# Patient Record
Sex: Female | Born: 1950
Health system: Southern US, Community
[De-identification: ages and names within clinical notes are randomized; demographics above are authoritative.]

## PROBLEM LIST (undated history)

## (undated) DIAGNOSIS — J302 Other seasonal allergic rhinitis: Secondary | ICD-10-CM

## (undated) DIAGNOSIS — M1990 Unspecified osteoarthritis, unspecified site: Secondary | ICD-10-CM

## (undated) DIAGNOSIS — F419 Anxiety disorder, unspecified: Secondary | ICD-10-CM

## (undated) DIAGNOSIS — Z8632 Personal history of gestational diabetes: Secondary | ICD-10-CM

## (undated) DIAGNOSIS — C679 Malignant neoplasm of bladder, unspecified: Secondary | ICD-10-CM

## (undated) DIAGNOSIS — Z8719 Personal history of other diseases of the digestive system: Secondary | ICD-10-CM

## (undated) DIAGNOSIS — Z9289 Personal history of other medical treatment: Secondary | ICD-10-CM

## (undated) DIAGNOSIS — M8580 Other specified disorders of bone density and structure, unspecified site: Secondary | ICD-10-CM

## (undated) DIAGNOSIS — E785 Hyperlipidemia, unspecified: Secondary | ICD-10-CM

## (undated) DIAGNOSIS — R002 Palpitations: Secondary | ICD-10-CM

## (undated) DIAGNOSIS — Z973 Presence of spectacles and contact lenses: Secondary | ICD-10-CM

## (undated) DIAGNOSIS — I493 Ventricular premature depolarization: Secondary | ICD-10-CM

## (undated) DIAGNOSIS — J45909 Unspecified asthma, uncomplicated: Secondary | ICD-10-CM

## (undated) DIAGNOSIS — Z8679 Personal history of other diseases of the circulatory system: Secondary | ICD-10-CM

## (undated) DIAGNOSIS — L4050 Arthropathic psoriasis, unspecified: Secondary | ICD-10-CM

## (undated) HISTORY — PX: BREAST BIOPSY: SHX20

## (undated) HISTORY — DX: Unspecified asthma, uncomplicated: J45.909

---

## 1985-11-26 HISTORY — PX: TEMPOROMANDIBULAR JOINT SURGERY: SHX35

## 1999-10-30 ENCOUNTER — Other Ambulatory Visit: Admission: RE | Admit: 1999-10-30 | Discharge: 1999-10-30 | Payer: Self-pay | Admitting: *Deleted

## 2001-02-07 ENCOUNTER — Encounter: Admission: RE | Admit: 2001-02-07 | Discharge: 2001-02-07 | Payer: Self-pay | Admitting: Internal Medicine

## 2001-02-07 ENCOUNTER — Encounter: Payer: Self-pay | Admitting: Internal Medicine

## 2001-02-25 ENCOUNTER — Encounter: Payer: Self-pay | Admitting: Internal Medicine

## 2001-02-25 ENCOUNTER — Encounter: Admission: RE | Admit: 2001-02-25 | Discharge: 2001-02-25 | Payer: Self-pay | Admitting: Family Medicine

## 2001-08-15 ENCOUNTER — Other Ambulatory Visit: Admission: RE | Admit: 2001-08-15 | Discharge: 2001-08-15 | Payer: Self-pay | Admitting: Obstetrics and Gynecology

## 2002-02-24 ENCOUNTER — Ambulatory Visit (HOSPITAL_COMMUNITY): Admission: RE | Admit: 2002-02-24 | Discharge: 2002-02-24 | Payer: Self-pay | Admitting: Gastroenterology

## 2003-02-12 ENCOUNTER — Other Ambulatory Visit: Admission: RE | Admit: 2003-02-12 | Discharge: 2003-02-12 | Payer: Self-pay | Admitting: Obstetrics and Gynecology

## 2003-06-17 ENCOUNTER — Inpatient Hospital Stay (HOSPITAL_COMMUNITY): Admission: RE | Admit: 2003-06-17 | Discharge: 2003-06-20 | Payer: Self-pay | Admitting: Obstetrics and Gynecology

## 2003-06-17 ENCOUNTER — Encounter (INDEPENDENT_AMBULATORY_CARE_PROVIDER_SITE_OTHER): Payer: Self-pay | Admitting: Specialist

## 2003-06-17 HISTORY — PX: TOTAL ABDOMINAL HYSTERECTOMY: SHX209

## 2003-09-03 ENCOUNTER — Encounter: Payer: Self-pay | Admitting: Obstetrics and Gynecology

## 2003-09-03 ENCOUNTER — Ambulatory Visit (HOSPITAL_COMMUNITY): Admission: RE | Admit: 2003-09-03 | Discharge: 2003-09-03 | Payer: Self-pay | Admitting: Obstetrics and Gynecology

## 2004-01-24 ENCOUNTER — Encounter: Admission: RE | Admit: 2004-01-24 | Discharge: 2004-01-24 | Payer: Self-pay | Admitting: *Deleted

## 2006-07-12 ENCOUNTER — Encounter: Admission: RE | Admit: 2006-07-12 | Discharge: 2006-07-12 | Payer: Self-pay | Admitting: Internal Medicine

## 2008-07-16 ENCOUNTER — Encounter: Admission: RE | Admit: 2008-07-16 | Discharge: 2008-07-16 | Payer: Self-pay | Admitting: Internal Medicine

## 2010-11-09 ENCOUNTER — Ambulatory Visit (HOSPITAL_COMMUNITY)
Admission: RE | Admit: 2010-11-09 | Discharge: 2010-11-09 | Payer: Self-pay | Source: Home / Self Care | Attending: Gastroenterology | Admitting: Gastroenterology

## 2010-11-09 HISTORY — PX: COLONOSCOPY: SHX174

## 2011-04-13 NOTE — H&P (Signed)
   NAME:  Christina Campos, Christina Campos                         ACCOUNT NO.:  [id number]   MEDICAL RECORD NO.:  [id number]                   PATIENT TYPE:  AMB   LOCATION:  SDC                                  FACILITY:  [facility]   PHYSICIAN:  Lenoard Aden, M.D.             DATE OF BIRTH:  Sep 17, 1951   DATE OF ADMISSION:  06/17/2003  DATE OF DISCHARGE:                                HISTORY & PHYSICAL   CHIEF COMPLAINT:  Symptomatic fibroids with secondary anemia, for definitive  therapy.   HISTORY OF PRESENT ILLNESS:  The patient is a 60 year old white female, G3,  P2 with symptomatic uterine fibroids and intermittent anemia. Ultrasound  consistent with a markedly enlarged uterus with a least 6 notable fibroids  and bilateral normal ovaries.   MEDICATIONS:  Include Zoloft.   PAST SURGICAL HISTORY:  She has a history of TMJ surgery back in the 1980's.   OBSTETRIC HISTORY:  Remarkable for 3 pregnancies, 1 miscarriage, and 2  vaginal deliveries.   PAST MEDICAL HISTORY:  She has had no other medical or surgical  hospitalizations.   ALLERGIES:  CODEINE.   FAMILY HISTORY:  Diabetes, hypertension and mental retardation.   SOCIAL HISTORY:  She is a previous smoker. She denies domestic or physical  violence.   PHYSICAL EXAMINATION:  GENERAL: A well developed, well nourished  white  female.  VITAL SIGNS: Weight of 201 pounds.  HEENT: Normal.  LUNGS: Clear.  HEART: Regular rate and rhythm.  ABDOMEN: Soft, nontender.  PELVIC: Uterine is 14-16 weeks size. No adnexal masses.  EXTREMITIES: Normal.  NEUROLOGIC: Nonfocal.   IMPRESSION:  Symptomatic uterine fibroids with history of secondary anemia,  now with improvement in her anemia on iron therapy.    PLAN:  Proceed with TAH and possible BSO. Risks of anesthesia, infection,  bleeding, intra-abdominal organ injury. Discussed labor, significant  complications to include bowel and bladder injury ase noted. Ovarian  conservation, risks, and  removal discussed. The patient acknowledges risks  and wishes to proceed.                                               Lenoard Aden, M.D.    RJT/MEDQ  D:  06/16/2003  T:  06/16/2003  Job:  [id number]

## 2011-04-13 NOTE — Discharge Summary (Signed)
   Christina Campos, Christina Campos                         ACCOUNT NO.:  [id number]   MEDICAL RECORD NO.:  [id number]                   PATIENT TYPE:  INP   LOCATION:  9304                                 [facility]ACILITY:  [facility]   PHYSICIAN:  Lenoard Aden, M.D.             DATE OF BIRTH:  12/03/1950   DATE OF ADMISSION:  06/17/2003  DATE OF DISCHARGE:  06/20/2003                                 DISCHARGE SUMMARY   The patient underwent total abdominal hysterectomy, enterocele repair and  right ovarian cystectomy on June 17, 2003 without problem.  Postpartum day  number one the patient was slightly distended, no nausea, vomiting,  tolerated regular diet well.  Normal bowel movement postoperative day number  one.  Discharged to home postoperative day number three after resolution of  postoperative ileus.  As noted, hemoglobin stable, ambulating without  difficulty.  Discharged home on Percocet for pain and incisional care as  discussed.  Follow up in the office in 4-6 weeks.                                               Lenoard Aden, M.D.    RJT/MEDQ  D:  06/20/2003  T:  06/20/2003  Job:  [id number]

## 2011-04-13 NOTE — Discharge Summary (Signed)
   Christina Campos, Christina Campos                         ACCOUNT NO.:  [id number]   MEDICAL RECORD NO.:  [id number]                   PATIENT TYPE:  INP   LOCATION:  9304                                 [facility]ILITY[facility]   PHYSICIAN:  Lenoard Aden, M.D.             DATE OF BIRTH:  [birth year]   DATE OF ADMISSION:  06/17/2003  DATE OF DISCHARGE:  06/20/2003                                 DISCHARGE SUMMARY   No dictation given.                                               Lenoard Aden, M.D.    RJT/MEDQ  D:  06/20/2003  T:  06/20/2003  Job:  [id number]

## 2011-04-13 NOTE — Op Note (Signed)
Christina Campos, Christina Campos                         ACCOUNT NO.:  [id number]   MEDICAL RECORD NO.:  [id number]                   PATIENT TYPE:  INP   LOCATION:  9304                                 [facility]ACILITY:  [facility]   PHYSICIAN:  Lenoard Aden, M.D.             DATE OF BIRTH:  1951-11-04   DATE OF PROCEDURE:  06/17/2003  DATE OF DISCHARGE:                                 OPERATIVE REPORT   PREOPERATIVE DIAGNOSES:  1. Menorrhagia.  2. Uterine fibroids.   POSTOPERATIVE DIAGNOSES:  1. Menorrhagia.  2. Uterine fibroids.  3. Right ovarian cyst.  4. Enterocele.   PROCEDURES:  1. Total abdominal hysterectomy.  2. Right ovarian cystectomy.  3. McCall culdoplasty.   SURGEON:  Lenoard Aden, M.D.   ASSISTANT:  Cordelia Pen A. Rosalio Macadamia, M.D.   ANESTHESIA:  General.   ESTIMATED BLOOD LOSS:  250 mL.   DRAINS:  Foley.   COUNTS:  Correct.   Patient to recovery in good condition.   FINDINGS:  A 16-18 week size uterus.  Bilateral normal tubes and ovaries.   BRIEF OPERATIVE NOTE:  After being apprised of the risks of anesthesia,  infection, bleeding, injury to abdominal organs and need for repair, the  patient was brought to the operating room, where she was administered a  general anesthetic without complications and prepped and draped in the usual  sterile fashion, a Foley catheter had been placed.  After achieving adequate  anesthesia, dilute Marcaine solution was placed in the area.  A Pfannenstiel  skin incision was then made with a scalpel and carried down to the fascia,  which was nicked in the midline and opened transversely using  electrocautery.  Peritoneum identified in the midline, entered sharply.  A  large, irregularly-shaped 16-18 week size uterus was exteriorized.  The  round ligaments are bilaterally grasped and suture ligated.  Retroperitoneal  space is entered.  Bilaterally normal tubes and right ovary with a cystic  nodule on it is removed, being elevated with an  Allis clamp and cauterized  at the base and sent to pathology.  Good hemostasis is noted on that right  ovary.  At this time the tubo-ovarian ligament is grasped and ligated using  the LigaSure.  The same procedure is done on the left side.  The left ovary  appears normal.  At this time uterine vessels are skeletonized bilaterally,  the bladder flap is developed sharply and dissected sharply off the lower  uterine segment.  Uterine vessels are ligated using the LigaSure and  divided.  At this time a partial specimen is removed at the uterine-cervical  junction and sent for permanent section.  The cardinal and broad ligaments  are further ligated using the LigaSure after identifying the ureter  bilaterally.  The uterosacral ligaments are taken separately with Heaney  clamps, clamped, and suture ligated and held.  Vaginal entry is made  laterally.  Specimen of the  uterine cervix is removed.  The uterus is closed  side-to-side using 0 Vicryl interrupted sutures.  At this time after closing  the vagina side-to-side, an enterocele is identified posteriorly and closed  using a McCall culdoplasty suture with 0 Vicryl suture x2.  The uterosacral  ligaments are then plicated to the vaginal angle and then plicated in the  midline.  At this time good hemostasis is noted.  Irrigation is  accomplished.  All packs and Balfour retractor are removed, fascia closed  using 0 Vicryl in continuous running fashion and the skin closed using  staples.  The patient tolerates the procedure well and is transferred to  recovery in good condition.                                               Lenoard Aden, M.D.    RJT/MEDQ  D:  06/17/2003  T:  06/17/2003  Job:  [id number]

## 2012-11-26 DIAGNOSIS — R002 Palpitations: Secondary | ICD-10-CM

## 2012-11-26 DIAGNOSIS — I493 Ventricular premature depolarization: Secondary | ICD-10-CM

## 2012-11-26 HISTORY — DX: Ventricular premature depolarization: I49.3

## 2012-11-26 HISTORY — DX: Palpitations: R00.2

## 2013-08-31 ENCOUNTER — Encounter (HOSPITAL_COMMUNITY): Payer: Self-pay | Admitting: *Deleted

## 2013-08-31 ENCOUNTER — Emergency Department (HOSPITAL_COMMUNITY): Payer: 59

## 2013-08-31 ENCOUNTER — Emergency Department (HOSPITAL_COMMUNITY)
Admission: EM | Admit: 2013-08-31 | Discharge: 2013-08-31 | Disposition: A | Discharge status: Home / Self Care | Payer: 59 | Attending: Emergency Medicine | Admitting: Emergency Medicine

## 2013-08-31 DIAGNOSIS — Z8659 Personal history of other mental and behavioral disorders: Secondary | ICD-10-CM | POA: Insufficient documentation

## 2013-08-31 DIAGNOSIS — Z8639 Personal history of other endocrine, nutritional and metabolic disease: Secondary | ICD-10-CM | POA: Insufficient documentation

## 2013-08-31 DIAGNOSIS — I4949 Other premature depolarization: Secondary | ICD-10-CM | POA: Insufficient documentation

## 2013-08-31 DIAGNOSIS — Z87828 Personal history of other (healed) physical injury and trauma: Secondary | ICD-10-CM | POA: Insufficient documentation

## 2013-08-31 DIAGNOSIS — I10 Essential (primary) hypertension: Secondary | ICD-10-CM | POA: Insufficient documentation

## 2013-08-31 DIAGNOSIS — Z862 Personal history of diseases of the blood and blood-forming organs and certain disorders involving the immune mechanism: Secondary | ICD-10-CM | POA: Insufficient documentation

## 2013-08-31 DIAGNOSIS — R002 Palpitations: Secondary | ICD-10-CM

## 2013-08-31 DIAGNOSIS — Z8679 Personal history of other diseases of the circulatory system: Secondary | ICD-10-CM | POA: Insufficient documentation

## 2013-08-31 DIAGNOSIS — I493 Ventricular premature depolarization: Secondary | ICD-10-CM

## 2013-08-31 DIAGNOSIS — Z87891 Personal history of nicotine dependence: Secondary | ICD-10-CM | POA: Insufficient documentation

## 2013-08-31 DIAGNOSIS — E119 Type 2 diabetes mellitus without complications: Secondary | ICD-10-CM | POA: Insufficient documentation

## 2013-08-31 HISTORY — DX: Anxiety disorder, unspecified: F41.9

## 2013-08-31 HISTORY — DX: Hyperlipidemia, unspecified: E78.5

## 2013-08-31 LAB — CBC
MCH: 30.5 pg (ref 26.0–34.0)
MCV: 88.8 fL (ref 78.0–100.0)
Platelets: 260 10*3/uL (ref 150–400)
RBC: 4.3 MIL/uL (ref 3.87–5.11)
RDW: 13.2 % (ref 11.5–15.5)

## 2013-08-31 LAB — T4, FREE: Free T4: 0.99 ng/dL (ref 0.80–1.80)

## 2013-08-31 LAB — COMPREHENSIVE METABOLIC PANEL
ALT: 12 U/L (ref 0–35)
BUN: 18 mg/dL (ref 6–23)
Calcium: 9.5 mg/dL (ref 8.4–10.5)
GFR calc Af Amer: >90 mL/min (ref >90)
GFR calc non Af Amer: 89 mL/min — ABNORMAL LOW (ref >90)
Glucose, Bld: 112 mg/dL — ABNORMAL HIGH (ref 70–99)
Total Bilirubin: 0.2 mg/dL — ABNORMAL LOW (ref 0.3–1.2)
Total Protein: 7.1 g/dL (ref 6.0–8.3)

## 2013-08-31 LAB — POCT I-STAT TROPONIN I: Troponin i, poc: 0.01 ng/mL (ref 0.00–0.08)

## 2013-08-31 LAB — TSH: TSH: 1.163 u[IU]/mL (ref 0.350–4.500)

## 2013-08-31 MED ORDER — POTASSIUM CHLORIDE CRYS ER 20 MEQ PO TBCR
20.0000 meq | EXTENDED_RELEASE_TABLET | Freq: Every day | ORAL | Status: DC
Start: 2013-08-31 — End: 2013-09-29

## 2013-08-31 MED ORDER — POTASSIUM CHLORIDE CRYS ER 20 MEQ PO TBCR
40.0000 meq | EXTENDED_RELEASE_TABLET | Freq: Once | ORAL | Status: AC
  Administered 2013-08-31: 40 meq via ORAL
  Filled 2013-08-31: qty 2

## 2013-08-31 NOTE — ED Notes (Signed)
Pt here with palpitations since Friday.  Developed sob, diaphoresis, and can feel heart beat up into throat.  No chest pain, but reports mid back fullness.  Pt was shown to be in trigeminy at office

## 2013-08-31 NOTE — ED Provider Notes (Signed)
CSN: [id number]     Arrival date & time 08/31/13  1110 History   First MD Initiated Contact with Patient 08/31/13 1253     Chief Complaint  Patient presents with  . Palpitations   (Consider location/radiation/quality/duration/timing/severity/associated sxs/prior Treatment) Patient is a 62 y.o. female presenting with palpitations. The history is provided by the patient.  Palpitations Associated symptoms: no back pain, no chest pain, no numbness, no shortness of breath and no vomiting   pt c/o onset palpitations for the past few days. States feels as if skipped or irregular beat. No persistent fast heart beat. No faintness or dizziness. No hx syncope. No hx dysrhythmia. Pt went to [facility] today and referred to ED re frequent pvcs.  Pt denies any current or recent chest discomfort. No unusual fatigue or doe. No leg pain or swelling. States otherwise recent health at baseline. No recent uri, viral, or febrile illness. No wt loss. No fevers. Occasional brief sweats. No wt loss or skin changes. No hx thyroid disease. Denies any recent change in meds or new meds. Mild caffeine use, no recent change. No energy drink or stimulant use. Denies hx cad. Notes family hx cad in older age. Reports normal stress test a few yrs ago.      Past Medical History  Diagnosis Date  . Hypertension   . Hyperlipidemia   . Anxiety   . Diverticulitis   . Diabetes mellitus without complication     gestational   Past Surgical History  Procedure Laterality Date  . Rotator cuff repair      only a tear  . Abdominal hysterectomy    . Tmj repair     No family history on file. History  Substance Use Topics  . Smoking status: Former Games developer  . Smokeless tobacco: Not on file  . Alcohol Use: Yes     Comment: freq   OB History   Grav Para Term Preterm Abortions TAB SAB Ect Mult Living                 Review of Systems  Constitutional: Negative for fever and chills.  HENT: Negative for neck pain.   Eyes:  Negative for visual disturbance.  Respiratory: Negative for shortness of breath.   Cardiovascular: Positive for palpitations. Negative for chest pain.  Gastrointestinal: Negative for vomiting, abdominal pain, diarrhea and blood in stool.  Genitourinary: Negative for flank pain.  Musculoskeletal: Negative for back pain.  Skin: Negative for rash.  Neurological: Negative for syncope, weakness, numbness and headaches.  Hematological: Does not bruise/bleed easily.  Psychiatric/Behavioral: Negative for confusion.    Allergies  Review of patient's allergies indicates not on file.  Home Medications  No current outpatient prescriptions on file. BP 139/71  Pulse 90  Temp(Src) 98 F (36.7 C) (Oral)  Resp 18  SpO2 95% Physical Exam  Nursing note and vitals reviewed. Constitutional: She is oriented to person, place, and time. She appears well-developed and well-nourished. No distress.  HENT:  Mouth/Throat: Oropharynx is clear and moist.  Eyes: Conjunctivae are normal. No scleral icterus.  Neck: Neck supple. No tracheal deviation present. No thyromegaly present.  Cardiovascular: Normal rate, regular rhythm, normal heart sounds and intact distal pulses.  Exam reveals no gallop and no friction rub.   No murmur heard. Pulmonary/Chest: Effort normal and breath sounds normal. No respiratory distress.  Abdominal: Soft. Normal appearance. She exhibits no distension. There is no tenderness.  Musculoskeletal: She exhibits no edema and no tenderness.  Neurological: She is  alert and oriented to person, place, and time.  Skin: Skin is warm and dry. No rash noted. She is not diaphoretic.  Psychiatric: She has a normal mood and affect.    ED Course  Procedures (including critical care time)  Results for orders placed during the hospital encounter of 08/31/13  CBC      Result Value Range   WBC 7.4  4.0 - 10.5 K/uL   RBC 4.30  3.87 - 5.11 MIL/uL   Hemoglobin 13.1  12.0 - 15.0 g/dL   HCT 16.1  09.6  - 04.5 %   MCV 88.8  78.0 - 100.0 fL   MCH 30.5  26.0 - 34.0 pg   MCHC 34.3  30.0 - 36.0 g/dL   RDW 40.9  81.1 - 91.4 %   Platelets 260  150 - 400 K/uL  PRO B NATRIURETIC PEPTIDE      Result Value Range   Pro B Natriuretic peptide (BNP) 396.8 (*) 0 - 125 pg/mL  MAGNESIUM      Result Value Range   Magnesium 2.1  1.5 - 2.5 mg/dL  COMPREHENSIVE METABOLIC PANEL      Result Value Range   Sodium 141  135 - 145 mEq/L   Potassium 3.2 (*) 3.5 - 5.1 mEq/L   Chloride 103  96 - 112 mEq/L   CO2 26  19 - 32 mEq/L   Glucose, Bld 112 (*) 70 - 99 mg/dL   BUN 18  6 - 23 mg/dL   Creatinine, Ser 7.82  0.50 - 1.10 mg/dL   Calcium 9.5  8.4 - 95.6 mg/dL   Total Protein 7.1  6.0 - 8.3 g/dL   Albumin 3.7  3.5 - 5.2 g/dL   AST 15  0 - 37 U/L   ALT 12  0 - 35 U/L   Alkaline Phosphatase 116  39 - 117 U/L   Total Bilirubin 0.2 (*) 0.3 - 1.2 mg/dL   GFR calc non Af Amer 89 (*) >90 mL/min   GFR calc Af Amer >90  >90 mL/min  POCT I-STAT TROPONIN I      Result Value Range   Troponin i, poc 0.01  0.00 - 0.08 ng/mL   Comment 3            Dg Chest 2 View  08/31/2013   CLINICAL DATA:  Chest palpitations.  EXAM: CHEST  2 VIEW  COMPARISON:  None.  FINDINGS: Heart size and mediastinal contours are within normal limits. Both lungs are clear. Visualized skeletal structures are unremarkable.  IMPRESSION: No active cardiopulmonary disease.   Electronically Signed   By: Drusilla Kanner M.D.   On: 08/31/2013 12:04     MDM  Iv ns. Monitor. Labs.  Reviewed nursing notes and prior charts for additional history.    Date: 08/31/2013  Rate: 104  Rhythm: sinus arrhythmia and premature ventricular contractions (PVC)  QRS Axis: normal  Intervals: normal  ST/T Wave abnormalities: normal  Conduction Disutrbances:none  Narrative Interpretation:   Old EKG Reviewed: none available   After symptoms present for 3 days, trop neg.   Pt w pvcs on monitor, no runs vt or other dysrhythmia.   No syncope.  k mildly low.  kcl 40 meq po.  Will give k supp, and have f/u pcp/card in coming week.   Pt appears stable for d/c.     Suzi Roots, MD 08/31/13 1500

## 2013-08-31 NOTE — ED Notes (Signed)
MD at bedside. 

## 2013-09-02 ENCOUNTER — Encounter: Payer: Self-pay | Admitting: Cardiology

## 2013-09-02 ENCOUNTER — Ambulatory Visit (INDEPENDENT_AMBULATORY_CARE_PROVIDER_SITE_OTHER): Payer: 59 | Admitting: Cardiology

## 2013-09-02 VITALS — BP 128/77 | HR 75 | Ht 67.0 in | Wt 219.0 lb

## 2013-09-02 DIAGNOSIS — I10 Essential (primary) hypertension: Secondary | ICD-10-CM | POA: Insufficient documentation

## 2013-09-02 DIAGNOSIS — E7800 Pure hypercholesterolemia, unspecified: Secondary | ICD-10-CM

## 2013-09-02 DIAGNOSIS — I493 Ventricular premature depolarization: Secondary | ICD-10-CM

## 2013-09-02 DIAGNOSIS — R0789 Other chest pain: Secondary | ICD-10-CM

## 2013-09-02 DIAGNOSIS — I4949 Other premature depolarization: Secondary | ICD-10-CM

## 2013-09-02 NOTE — Patient Instructions (Signed)
We will schedule you for a stress Echo.   

## 2013-09-02 NOTE — Progress Notes (Signed)
Ave Filter Date of Birth: 03-Jul-1951 Medical Record #[id number]  History of Present Illness: Christina Campos is seen at the request of the emergency department for evaluation of PVCs. Her primary physician is Dr. Kirby Funk. 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 She reports she had an exercise stress test 8 or 9 years ago for evaluation of some left arm pain. This was normal. This past Friday she had just gotten off of work when she suddenly felt an unusual sensation in her chest. She felt very anxious. Her symptoms did not let up. On Saturday and Sunday she complained of tightness in her back and chest. She felt tired and had profuse sweating. She went to the emergency room where she was noted to have PVCs. Her BNP level was mildly elevated. Her potassium was low at 3.2. Her other evaluation including thyroid function studies was unremarkable. She was placed on potassium. She states her symptoms have improved since then but had still not completely resolved. She does note that her symptoms are worse after meals or with exertion. She denies any dizziness or syncope.  Current Outpatient Prescriptions on File Prior to Visit  Medication Sig Dispense Refill  . potassium chloride SA (K-DUR,KLOR-CON) 20 MEQ tablet Take 1 tablet (20 mEq total) by mouth daily.  10 tablet  0   No current facility-administered medications on file prior to visit.    Allergies  Allergen Reactions  . Codeine     Past Medical History  Diagnosis Date  . Hypertension   . Hyperlipidemia   . Anxiety   . Diverticulitis   . Diabetes mellitus without complication     gestational  . Rotator cuff tear   . Asthma     Past Surgical History  Procedure Laterality Date  . Abdominal hysterectomy    . Tmj repair      History  Smoking status  . Former Smoker  Smokeless tobacco  . Not on file    History  Alcohol Use  . Yes    Comment:  freq    Family History  Problem Relation Age of Onset  . Arrhythmia Brother   . Arrhythmia Brother     Review of Systems: The review of systems is positive for history of asthma this past year. At one point she was treated with steroids but this made her feel terrible. She has not currently using inhaler therapy. She drinks one cup of caffeinated beverage per day. She drinks a glass of wine with dinner for 5 days a week. All other systems were reviewed and are negative.  Physical Exam: BP 128/77  Pulse 75  Ht 5\' 7"  (1.702 m)  Wt 219 lb (99.338 kg)  BMI 34.29 kg/m2 She is an obese white female in no acute distress. HEENT: Normocephalic, atraumatic. Pupils equal round and reactive. Sclera are clear. Oropharynx is clear. Neck is supple no jugular venous distention, adenopathy, thyromegaly, or bruits. Lungs: Clear Cardiovascular: Regular rate and rhythm. Normal S1 and S2. No gallop, murmur, or click. Abdomen: Obese, soft, nontender. No masses or bruits. Extremities: No cyanosis or edema. Pedal pulses are 2+ and symmetric. Neuro: Alert and oriented x3. Cranial nerves II through XII are intact. Skin: Warm and dry.  LABORATORY DATA: ECG today demonstrates normal sinus rhythm with a normal ECG. Chest x-ray in the emergency department was normal. Lab Results  Component Value Date   WBC 7.4 08/31/2013   HGB 13.1  08/31/2013   HCT 38.2 08/31/2013   PLT 260 08/31/2013   GLUCOSE 112* 08/31/2013   ALT 12 08/31/2013   AST 15 08/31/2013   NA 141 08/31/2013   K 3.2* 08/31/2013   CL 103 08/31/2013   CREATININE 0.77 08/31/2013   BUN 18 08/31/2013   CO2 26 08/31/2013   TSH 1.163 08/31/2013     Assessment / Plan: 1. PVCs. These are symptomatic. She does have some symptoms of chest tightness. Possibly exacerbated by hypokalemia. Given her cardiac risk factors I think we need to rule out left ventricular dysfunction or ischemia. We will schedule her for a stress echo. If normal we can reassure her that  her PVCs are benign. Given her history of asthma I would like to avoid beta blocker therapy if possible. We'll continue potassium therapy. Avoid caffeine. I'll followup again in about 4 weeks. 2. Hypertension-controlled 3. Hyperlipidemia.

## 2013-09-23 ENCOUNTER — Ambulatory Visit (HOSPITAL_BASED_OUTPATIENT_CLINIC_OR_DEPARTMENT_OTHER): Payer: 59

## 2013-09-23 ENCOUNTER — Ambulatory Visit (HOSPITAL_COMMUNITY): Payer: 59 | Attending: Cardiology | Admitting: Radiology

## 2013-09-23 DIAGNOSIS — R072 Precordial pain: Secondary | ICD-10-CM

## 2013-09-23 DIAGNOSIS — R61 Generalized hyperhidrosis: Secondary | ICD-10-CM | POA: Insufficient documentation

## 2013-09-23 DIAGNOSIS — R0789 Other chest pain: Secondary | ICD-10-CM

## 2013-09-23 DIAGNOSIS — E785 Hyperlipidemia, unspecified: Secondary | ICD-10-CM | POA: Insufficient documentation

## 2013-09-23 DIAGNOSIS — R079 Chest pain, unspecified: Secondary | ICD-10-CM | POA: Insufficient documentation

## 2013-09-23 DIAGNOSIS — I493 Ventricular premature depolarization: Secondary | ICD-10-CM

## 2013-09-23 DIAGNOSIS — E7800 Pure hypercholesterolemia, unspecified: Secondary | ICD-10-CM

## 2013-09-23 DIAGNOSIS — E669 Obesity, unspecified: Secondary | ICD-10-CM | POA: Insufficient documentation

## 2013-09-23 DIAGNOSIS — Z6834 Body mass index (BMI) 34.0-34.9, adult: Secondary | ICD-10-CM | POA: Insufficient documentation

## 2013-09-23 DIAGNOSIS — Z87891 Personal history of nicotine dependence: Secondary | ICD-10-CM | POA: Insufficient documentation

## 2013-09-23 DIAGNOSIS — I10 Essential (primary) hypertension: Secondary | ICD-10-CM

## 2013-09-23 NOTE — Progress Notes (Signed)
Stress Echocardiogram performed.  

## 2013-09-24 ENCOUNTER — Other Ambulatory Visit (HOSPITAL_COMMUNITY): Payer: 59

## 2013-09-25 ENCOUNTER — Telehealth: Payer: Self-pay

## 2013-09-25 NOTE — Telephone Encounter (Signed)
Patient called no answer.Left message to call back for stress echo results.

## 2013-09-25 NOTE — Telephone Encounter (Signed)
Patient called spoke to husband patient not at [facility] left to have patient call me Monday 09/28/13 for stress echo results.

## 2013-09-28 ENCOUNTER — Telehealth: Payer: Self-pay | Admitting: Cardiology

## 2013-09-28 NOTE — Telephone Encounter (Signed)
New message  Patient is returning your call from Friday, please call her and she will leave her phone on.

## 2013-09-28 NOTE — Telephone Encounter (Signed)
Returned call to patient Dr.Jordan reviewed stress echo which was abnormal.Dr.Jordan out of office this week.Appointment scheduled with Norma Fredrickson NP 09/29/13 at 3:30 pm.

## 2013-09-29 ENCOUNTER — Other Ambulatory Visit: Payer: Self-pay | Admitting: Nurse Practitioner

## 2013-09-29 ENCOUNTER — Ambulatory Visit (INDEPENDENT_AMBULATORY_CARE_PROVIDER_SITE_OTHER): Payer: 59 | Admitting: Nurse Practitioner

## 2013-09-29 ENCOUNTER — Encounter: Payer: Self-pay | Admitting: Nurse Practitioner

## 2013-09-29 VITALS — BP 110/72 | HR 80 | Ht 67.0 in | Wt 215.8 lb

## 2013-09-29 DIAGNOSIS — R0789 Other chest pain: Secondary | ICD-10-CM

## 2013-09-29 DIAGNOSIS — E876 Hypokalemia: Secondary | ICD-10-CM

## 2013-09-29 DIAGNOSIS — Z01810 Encounter for preprocedural cardiovascular examination: Secondary | ICD-10-CM

## 2013-09-29 MED ORDER — ASPIRIN EC 81 MG PO TBEC: 81.0000 mg | DELAYED_RELEASE_TABLET | Freq: Every day | ORAL | Status: DC

## 2013-09-29 NOTE — Patient Instructions (Addendum)
Stay on your current medicines but start a baby aspirin daily  No exertional activities  We will check lab today  We need to plan for a cardiac catheterization for next Wednesday with Dr. [place]  You are scheduled for a cardiac catheterization on Wednesday, November 12th with Dr. Sw[place]iland or associate.  Go to [facility] 2nd Floor Short Stay on Wednesday, November 12th at [facility] for an 8:30am case.  Enter thru the [employer] entrance A No food or drink after midnight on Tuesday. You may take your medications with a sip of water on the day of your procedure.     Coronary Angiography Coronary angiography is an X-ray procedure used to look at the arteries in the heart. In this procedure, a dye is injected through a long, hollow tube (catheter). The catheter is about the size of a piece of cooked spaghetti. The catheter injects a dye into an artery in your groin. X-rays are then taken to show if there is a blockage in the arteries of your heart. BEFORE THE PROCEDURE   Let your caregiver know if you have allergies to shellfish or contrast dye. Also let your caregiver know if you have kidney problems or failure.  Do not eat or drink starting from midnight up to the time of the procedure, or as directed.  You may drink enough water to take your medications the morning of the procedure if you were instructed to do so.  You should be at the hospital or outpatient facility where the procedure is to be done 60 minutes prior to the procedure or as directed. PROCEDURE  You may be given an IV medication to help you relax before the procedure.  You will be prepared for the procedure by washing and shaving the area where the catheter will be inserted. This is usually done in the groin but may be done in the fold of your arm by your elbow.  A medicine will be given to numb your groin where the catheter will be inserted.  A specially trained doctor will insert the catheter into an artery in your  groin. The catheter is guided by using a special type of X-ray (fluoroscopy) to the blood vessel being examined.  A special dye is then injected into the catheter and X-rays are taken. The dye helps to show where any narrowing or blockages are located in the heart arteries. AFTER THE PROCEDURE   After the procedure you will be kept in bed lying flat for several hours. You will be instructed to not bend or cross your legs.  The groin insertion site will be watched and checked frequently.  The pulse in your feet will be checked frequently.  Additional blood tests, X-rays and an EKG may be done.  You may stay in the hospital overnight for observation. SEEK IMMEDIATE MEDICAL CARE IF:   You develop chest pain, shortness of breath, feel faint, or pass out.  There is bleeding, swelling, or drainage from the catheter insertion site.  You develop pain, discoloration, coldness, or severe bruising in the leg or area where the catheter was inserted.  You have a fever. Document Released: 05/19/2003 Document Revised: 02/04/2012 Document Reviewed: 07/07/2008 [facility] Patient Information 2014 [place], [place].

## 2013-09-29 NOTE — Progress Notes (Signed)
Ave Filter Date of Birth: 03-Nov-1951 Medical Record #[id number]  History of Present Illness: Christina Campos is seen back today for a follow up visit. Seen for Dr. Swaziland. Has had HTN, HLD and anxiety. PVCs noted at visit to the ER last month.   Was seen for evaluation by Dr. Swaziland last month following her ER visit for chest tightness/PVCs - she has had remote stress testing that was reportedly normal. Was referred for a stress echo - results noted below.   Comes back today. Here with her husband. Doing ok. Continues to have palpitations - associated with chest tightness - feels fatigued - no symptoms that led her to go to the ER last month but did feel bad after her stress echo - says she had an asthma attack and had to use her inhaler. Symptoms exacerbated by stress/anxiety. No syncope reported. No repeat potassium and she is not taking potassium. Can take aspirin. Her risk factors include + FH, HTN, HLD, prior smoker and obesity. She has taken herself off of Cymbalta - due to her having the PVC's and was off of this for her stress echo.   Current Outpatient Prescriptions  Medication Sig Dispense Refill  . Coenzyme Q10 (COQ10) 200 MG CAPS Take by mouth every morning.      . estrogens, conjugated, (PREMARIN) 1.25 MG tablet Take 1.25 mg by mouth daily.      Marland Kitchen ibuprofen (ADVIL,MOTRIN) 200 MG tablet Take 200 mg by mouth every 6 (six) hours as needed for pain.      . rosuvastatin (CRESTOR) 10 MG tablet Take 10 mg by mouth daily.      . valsartan (DIOVAN) 160 MG tablet Take 160 mg by mouth daily.      Marland Kitchen aspirin EC 81 MG tablet Take 1 tablet (81 mg total) by mouth daily.  90 tablet  3   No current facility-administered medications for this visit.    Allergies  Allergen Reactions  . Codeine     Past Medical History  Diagnosis Date  . Hypertension   . Hyperlipidemia   . Anxiety   . Diverticulitis   . Diabetes mellitus without complication     gestational  . Rotator cuff tear   . Asthma      Past Surgical History  Procedure Laterality Date  . Abdominal hysterectomy    . Tmj repair      History  Smoking status  . Former Smoker  Smokeless tobacco  . Not on file    History  Alcohol Use  . Yes    Comment: freq    Family History  Problem Relation Age of Onset  . Arrhythmia Brother   . Arrhythmia Brother   . Heart disease Father   . Hypertension Father     Review of Systems: The review of systems is per the HPI.  All other systems were reviewed and are negative.  Physical Exam: BP 110/72  Pulse 80  Ht 5\' 7"  (1.702 m)  Wt 215 lb 12 oz (97.864 kg)  BMI 33.78 kg/m2 Patient is very pleasant and in no acute distress. She is obese. Skin is warm and dry. Color is normal.  HEENT is unremarkable. Normocephalic/atraumatic. PERRL. Sclera are nonicteric. Neck is supple. No masses. No JVD. Lungs are clear. Cardiac exam shows a regular rate and rhythm. Abdomen is soft. Extremities are without edema. Gait and ROM are intact. No gross neurologic deficits noted.  LABORATORY DATA: BMET Pending  Lab Results  Component Value Date  WBC 7.4 08/31/2013   HGB 13.1 08/31/2013   HCT 38.2 08/31/2013   PLT 260 08/31/2013   GLUCOSE 112* 08/31/2013   ALT 12 08/31/2013   AST 15 08/31/2013   NA 141 08/31/2013   K 3.2* 08/31/2013   CL 103 08/31/2013   CREATININE 0.77 08/31/2013   BUN 18 08/31/2013   CO2 26 08/31/2013   TSH 1.163 08/31/2013   Dg Chest 2 View  08/31/2013   IMPRESSION: No active cardiopulmonary disease.   Electronically Signed   By: Drusilla Kanner M.D.   On: 08/31/2013 12:04   Stress Echo Impressions:  - Clincially negative for ischemia. EKG with NSVT otherise no ST changes to suggest ischemia. After exercise there was hypokinesis in the mid/distal lateral wall and apex consistent with ischemia Positive stress echo.  Assessment / Plan: 1. Symptomatic PVCs/chest tightness/fatigue - abnormal stress echo - will proceed with cardiac cath - she would like for Dr. Swaziland  to perform. Procedure/risks/benefits have been discussed and she is willing to proceed. Aspirin 81 mg started.  Will recheck a BMET today - repeat all her pre cath labs on the day of the procedure (she reports it is too difficult to come back here). She is to refrain from strenuous activities.   2. HTN - BP looks good on current therapy.  3. HLD - on statin therapy.   Patient is agreeable to this plan and will call if any problems develop in the interim.    Rosalio Macadamia, RN, ANP-C [facility] [address] [place], [place]  [address]

## 2013-09-30 ENCOUNTER — Encounter (HOSPITAL_COMMUNITY): Payer: Self-pay

## 2013-09-30 LAB — BASIC METABOLIC PANEL
BUN: 19 mg/dL (ref 6–23)
CO2: 33 mEq/L — ABNORMAL HIGH (ref 19–32)
Calcium: 9.8 mg/dL (ref 8.4–10.5)
Chloride: 101 mEq/L (ref 96–112)
Creatinine, Ser: 1 mg/dL (ref 0.4–1.2)
GFR: 60.01 mL/min (ref >60.00)
Glucose, Bld: 103 mg/dL — ABNORMAL HIGH (ref 70–99)
Potassium: 3.6 mEq/L (ref 3.5–5.1)
Sodium: 139 mEq/L (ref 135–145)

## 2013-10-05 ENCOUNTER — Encounter: Payer: Self-pay | Admitting: Internal Medicine

## 2013-10-05 NOTE — Progress Notes (Signed)
Patient ID: Christina Campos, female   DOB: [birth year], 62 y.o.   MRN: [id number]  Received after hours page from patient. She reports a left arm "heaviness" and "fatigue" that has been persistent throughout today. Started at work. She was easily able to complete her tasks at work without interruption. She denies any chest pressure, pain, breathing trouble, palpitations, syncope, stroke-like symptoms. Reassured patient that these symptoms are unlikely to represent heart trouble. She lives with her husband who is at home tonight. She is to have a cath by Dr. Swaziland on Wednesday. Instructed her to seek emergent attention if she developed chest pressure, pain, significant dyspnea, palpitations or syncope. She endorsed understanding.

## 2013-10-07 ENCOUNTER — Encounter (HOSPITAL_COMMUNITY)
Admission: RE | Disposition: A | Discharge status: Home / Self Care | Payer: Self-pay | Source: Ambulatory Visit | Attending: Cardiology

## 2013-10-07 ENCOUNTER — Other Ambulatory Visit: Payer: Self-pay | Admitting: Cardiology

## 2013-10-07 ENCOUNTER — Ambulatory Visit (HOSPITAL_COMMUNITY)
Admission: RE | Admit: 2013-10-07 | Discharge: 2013-10-07 | Disposition: A | Discharge status: Home / Self Care | Payer: 59 | Source: Ambulatory Visit | Attending: Cardiology | Admitting: Cardiology

## 2013-10-07 DIAGNOSIS — R079 Chest pain, unspecified: Secondary | ICD-10-CM

## 2013-10-07 DIAGNOSIS — E785 Hyperlipidemia, unspecified: Secondary | ICD-10-CM | POA: Insufficient documentation

## 2013-10-07 DIAGNOSIS — I10 Essential (primary) hypertension: Secondary | ICD-10-CM | POA: Insufficient documentation

## 2013-10-07 DIAGNOSIS — E876 Hypokalemia: Secondary | ICD-10-CM

## 2013-10-07 DIAGNOSIS — F411 Generalized anxiety disorder: Secondary | ICD-10-CM | POA: Insufficient documentation

## 2013-10-07 DIAGNOSIS — Z01810 Encounter for preprocedural cardiovascular examination: Secondary | ICD-10-CM

## 2013-10-07 HISTORY — PX: LEFT HEART CATHETERIZATION WITH CORONARY ANGIOGRAM: SHX5451

## 2013-10-07 LAB — CBC
HCT: 38.6 % (ref 36.0–46.0)
Hemoglobin: 13 g/dL (ref 12.0–15.0)
MCH: 30.3 pg (ref 26.0–34.0)
MCHC: 33.7 g/dL (ref 30.0–36.0)
MCV: 90 fL (ref 78.0–100.0)
Platelets: 238 10*3/uL (ref 150–400)
RBC: 4.29 MIL/uL (ref 3.87–5.11)
RDW: 12.8 % (ref 11.5–15.5)
WBC: 8.4 10*3/uL (ref 4.0–10.5)

## 2013-10-07 LAB — BASIC METABOLIC PANEL
BUN: 18 mg/dL (ref 6–23)
CO2: 25 mEq/L (ref 19–32)
Calcium: 9.4 mg/dL (ref 8.4–10.5)
Chloride: 101 mEq/L (ref 96–112)
Creatinine, Ser: 0.9 mg/dL (ref 0.50–1.10)
GFR calc Af Amer: 79 mL/min — ABNORMAL LOW (ref >90)
GFR calc non Af Amer: 68 mL/min — ABNORMAL LOW (ref >90)
Glucose, Bld: 125 mg/dL — ABNORMAL HIGH (ref 70–99)
Potassium: 3.4 mEq/L — ABNORMAL LOW (ref 3.5–5.1)
Sodium: 139 mEq/L (ref 135–145)

## 2013-10-07 LAB — PROTIME-INR
INR: 0.86 (ref 0.00–1.49)
Prothrombin Time: 11.6 seconds (ref 11.6–15.2)

## 2013-10-07 SURGERY — LEFT HEART CATHETERIZATION WITH CORONARY ANGIOGRAM
Anesthesia: LOCAL

## 2013-10-07 MED ORDER — DIAZEPAM 5 MG PO TABS
10.0000 mg | ORAL_TABLET | ORAL | Status: AC
  Administered 2013-10-07: 10 mg via ORAL

## 2013-10-07 MED ORDER — VERAPAMIL HCL 2.5 MG/ML IV SOLN
INTRAVENOUS | Status: AC
  Filled 2013-10-07: qty 2

## 2013-10-07 MED ORDER — POTASSIUM CHLORIDE CRYS ER 20 MEQ PO TBCR
20.0000 meq | EXTENDED_RELEASE_TABLET | Freq: Once | ORAL | Status: AC
  Administered 2013-10-07: 20 meq via ORAL
  Filled 2013-10-07: qty 1

## 2013-10-07 MED ORDER — ASPIRIN 81 MG PO CHEW
CHEWABLE_TABLET | ORAL | Status: AC
  Filled 2013-10-07: qty 1

## 2013-10-07 MED ORDER — SODIUM CHLORIDE 0.9 % IV SOLN
INTRAVENOUS | Status: DC
  Administered 2013-10-07: 07:00:00 via INTRAVENOUS

## 2013-10-07 MED ORDER — DIAZEPAM 5 MG PO TABS
ORAL_TABLET | ORAL | Status: AC
  Filled 2013-10-07: qty 2

## 2013-10-07 MED ORDER — ACETAMINOPHEN 325 MG PO TABS: 650.0000 mg | ORAL_TABLET | ORAL | Status: DC | PRN

## 2013-10-07 MED ORDER — FENTANYL CITRATE 0.05 MG/ML IJ SOLN
INTRAMUSCULAR | Status: AC
  Filled 2013-10-07: qty 2

## 2013-10-07 MED ORDER — ONDANSETRON HCL 4 MG/2ML IJ SOLN: 4.0000 mg | Freq: Four times a day (QID) | INTRAMUSCULAR | Status: DC | PRN

## 2013-10-07 MED ORDER — LIDOCAINE HCL (PF) 1 % IJ SOLN
INTRAMUSCULAR | Status: AC
  Filled 2013-10-07: qty 30

## 2013-10-07 MED ORDER — ASPIRIN 81 MG PO CHEW
81.0000 mg | CHEWABLE_TABLET | ORAL | Status: AC
  Administered 2013-10-07: 81 mg via ORAL

## 2013-10-07 MED ORDER — POTASSIUM CHLORIDE CRYS ER 20 MEQ PO TBCR: 20.0000 meq | EXTENDED_RELEASE_TABLET | Freq: Every day | ORAL | Status: DC

## 2013-10-07 MED ORDER — POTASSIUM CHLORIDE CRYS ER 20 MEQ PO TBCR
EXTENDED_RELEASE_TABLET | ORAL | Status: AC
  Filled 2013-10-07: qty 1

## 2013-10-07 MED ORDER — POTASSIUM CHLORIDE CRYS ER 10 MEQ PO TBCR: 20.0000 meq | EXTENDED_RELEASE_TABLET | Freq: Once | ORAL | Status: DC

## 2013-10-07 MED ORDER — SODIUM CHLORIDE 0.9 % IV SOLN: 1.0000 mL/kg/h | INTRAVENOUS | Status: DC

## 2013-10-07 MED ORDER — HEPARIN (PORCINE) IN NACL 2-0.9 UNIT/ML-% IJ SOLN
INTRAMUSCULAR | Status: AC
  Filled 2013-10-07: qty 1000

## 2013-10-07 MED ORDER — HEPARIN SODIUM (PORCINE) 1000 UNIT/ML IJ SOLN
INTRAMUSCULAR | Status: AC
  Filled 2013-10-07: qty 1

## 2013-10-07 MED ORDER — NITROGLYCERIN 0.2 MG/ML ON CALL CATH LAB
INTRAVENOUS | Status: AC
  Filled 2013-10-07: qty 1

## 2013-10-07 MED ORDER — METOPROLOL TARTRATE 25 MG PO TABS: 25.0000 mg | ORAL_TABLET | Freq: Two times a day (BID) | ORAL | Status: DC

## 2013-10-07 MED ORDER — SODIUM CHLORIDE 0.9 % IJ SOLN: 3.0000 mL | INTRAMUSCULAR | Status: DC | PRN

## 2013-10-07 MED ORDER — SODIUM CHLORIDE 0.9 % IV SOLN: 250.0000 mL | INTRAVENOUS | Status: DC | PRN

## 2013-10-07 MED ORDER — MIDAZOLAM HCL 2 MG/2ML IJ SOLN
INTRAMUSCULAR | Status: AC
  Filled 2013-10-07: qty 2

## 2013-10-07 MED ORDER — SODIUM CHLORIDE 0.9 % IJ SOLN: 3.0000 mL | Freq: Two times a day (BID) | INTRAMUSCULAR | Status: DC

## 2013-10-07 NOTE — Progress Notes (Signed)
Discharge instruction given per MD order.  RX call in to pharmacy for Lopressor and  Potassium.    Pt and CG able to verbalize understanding.  Pt to car via wheelchair.

## 2013-10-07 NOTE — Interval H&P Note (Signed)
History and Physical Interval Note:  10/07/2013 8:48 AM  Christina Campos  has presented today for surgery, with the diagnosis of ANGINA  The various methods of treatment have been discussed with the patient and family. After consideration of risks, benefits and other options for treatment, the patient has consented to  Procedure(s): LEFT HEART CATHETERIZATION WITH CORONARY ANGIOGRAM (N/A) as a surgical intervention .  The patient's history has been reviewed, patient examined, no change in status, stable for surgery.  I have reviewed the patient's chart and labs.  Questions were answered to the patient's satisfaction.    Cath Lab Visit (complete for each Cath Lab visit)  Clinical Evaluation Leading to the Procedure:   ACS: no  Non-ACS:    Anginal Classification: CCS II  Anti-ischemic medical therapy: No Therapy  Non-Invasive Test Results: High-risk stress test findings: cardiac mortality >3%/year  Prior CABG: No previous CABG       [name] 10/07/2013 8:48 AM

## 2013-10-07 NOTE — H&P (View-Only) (Signed)
 Christina Campos Date of Birth: 08/18/1951 Medical Record #[id number]  History of Present Illness: Christina Campos is seen back today for a follow up visit. Seen for Christina Campos. Has had HTN, HLD and anxiety. PVCs noted at visit to the ER last month.   Was seen for evaluation by Christina Campos last month following her ER visit for chest tightness/PVCs - she has had remote stress testing that was reportedly normal. Was referred for a stress echo - results noted below.   Comes back today. Here with her husband. Doing ok. Continues to have palpitations - associated with chest tightness - feels fatigued - no symptoms that led her to go to the ER last month but did feel bad after her stress echo - says she had an asthma attack and had to use her inhaler. Symptoms exacerbated by stress/anxiety. No syncope reported. No repeat potassium and she is not taking potassium. Can take aspirin. Her risk factors include + FH, HTN, HLD, prior smoker and obesity. She has taken herself off of Cymbalta - due to her having the PVC's and was off of this for her stress echo.   Current Outpatient Prescriptions  Medication Sig Dispense Refill  . Coenzyme Q10 (COQ10) 200 MG CAPS Take by mouth every morning.      . estrogens, conjugated, (PREMARIN) 1.25 MG tablet Take 1.25 mg by mouth daily.      . ibuprofen (ADVIL,MOTRIN) 200 MG tablet Take 200 mg by mouth every 6 (six) hours as needed for pain.      . rosuvastatin (CRESTOR) 10 MG tablet Take 10 mg by mouth daily.      . valsartan (DIOVAN) 160 MG tablet Take 160 mg by mouth daily.      . aspirin EC 81 MG tablet Take 1 tablet (81 mg total) by mouth daily.  90 tablet  3   No current facility-administered medications for this visit.    Allergies  Allergen Reactions  . Codeine     Past Medical History  Diagnosis Date  . Hypertension   . Hyperlipidemia   . Anxiety   . Diverticulitis   . Diabetes mellitus without complication     gestational  . Rotator cuff tear   . Asthma      Past Surgical History  Procedure Laterality Date  . Abdominal hysterectomy    . Tmj repair      History  Smoking status  . Former Smoker  Smokeless tobacco  . Not on file    History  Alcohol Use  . Yes    Comment: freq    Family History  Problem Relation Age of Onset  . Arrhythmia Brother   . Arrhythmia Brother   . Heart disease Father   . Hypertension Father     Review of Systems: The review of systems is per the HPI.  All other systems were reviewed and are negative.  Physical Exam: BP 110/72  Pulse 80  Ht 5' 7" (1.702 m)  Wt 215 lb 12 oz (97.864 kg)  BMI 33.78 kg/m2 Patient is very pleasant and in no acute distress. She is obese. Skin is warm and dry. Color is normal.  HEENT is unremarkable. Normocephalic/atraumatic. PERRL. Sclera are nonicteric. Neck is supple. No masses. No JVD. Lungs are clear. Cardiac exam shows a regular rate and rhythm. Abdomen is soft. Extremities are without edema. Gait and ROM are intact. No gross neurologic deficits noted.  LABORATORY DATA: BMET Pending  Lab Results  Component Value Date     WBC 7.4 08/31/2013   HGB 13.1 08/31/2013   HCT 38.2 08/31/2013   PLT 260 08/31/2013   GLUCOSE 112* 08/31/2013   ALT 12 08/31/2013   AST 15 08/31/2013   NA 141 08/31/2013   K 3.2* 08/31/2013   CL 103 08/31/2013   CREATININE 0.77 08/31/2013   BUN 18 08/31/2013   CO2 26 08/31/2013   TSH 1.163 08/31/2013   Dg Chest 2 View  08/31/2013   IMPRESSION: No active cardiopulmonary disease.   Electronically Signed   By: Christina  Campos M.D.   On: 08/31/2013 12:04   Stress Echo Impressions:  - Clincially negative for ischemia. EKG with NSVT otherise no ST changes to suggest ischemia. After exercise there was hypokinesis in the mid/distal lateral wall and apex consistent with ischemia Positive stress echo.  Assessment / Plan: 1. Symptomatic PVCs/chest tightness/fatigue - abnormal stress echo - will proceed with cardiac cath - she would like for Christina Campos  to perform. Procedure/risks/benefits have been discussed and she is willing to proceed. Aspirin 81 mg started.  Will recheck a BMET today - repeat all her pre cath labs on the day of the procedure (she reports it is too difficult to come back here). She is to refrain from strenuous activities.   2. HTN - BP looks good on current therapy.  3. HLD - on statin therapy.   Patient is agreeable to this plan and will call if any problems develop in the interim.    Christina Sakai C. Kahle Mcqueen, RN, ANP-C [facility] [address] , [place]  [address]  

## 2013-10-07 NOTE — CV Procedure (Signed)
    Cardiac Catheterization Procedure Note  Name: Christina Campos MRN: [id number] DOB: Oct 29, 1951  Procedure: Left Heart Cath, Selective Coronary Angiography, LV angiography  Indication: 62 yo WF with chest pain. Abnormal stress Echo.   Procedural Details: The right wrist was prepped, draped, and anesthetized with 1% lidocaine. Using the modified Seldinger technique, a 5 French sheath was introduced into the right radial artery. 3 mg of verapamil was administered through the sheath, weight-based unfractionated heparin was administered intravenously. Standard Judkins catheters were used for selective coronary angiography and left ventriculography. Catheter exchanges were performed over an exchange length guidewire. There were no immediate procedural complications. A TR band was used for radial hemostasis at the completion of the procedure.  The patient was transferred to the post catheterization recovery area for further monitoring.  Procedural Findings: Hemodynamics: AO 123/66 mean 92 mm Hg LV 126/10 mm Hg  Coronary angiography: Coronary dominance: right  Left mainstem: Normal  Left anterior descending (LAD): Normal  Left circumflex (LCx): Normal  Right coronary artery (RCA): Normal  Left ventriculography: Left ventricular systolic function is normal, LVEF is estimated at 55-65%, there is no significant mitral regurgitation   Final Conclusions:   1. Normal coronary anatomy 2. Normal LV function  Recommendations: Will treat her ventricular ectopy with low dose beta blocker and increase potassium dose.  Theron Arista [facility] 10/07/2013, 9:23 AM

## 2013-10-08 ENCOUNTER — Ambulatory Visit: Payer: 59 | Admitting: Cardiology

## 2013-10-15 ENCOUNTER — Observation Stay (HOSPITAL_COMMUNITY)
Admission: EM | Admit: 2013-10-15 | Discharge: 2013-10-19 | Disposition: A | Discharge status: Home / Self Care | Payer: 59 | Attending: General Surgery | Admitting: General Surgery

## 2013-10-15 ENCOUNTER — Emergency Department (HOSPITAL_COMMUNITY): Payer: 59

## 2013-10-15 ENCOUNTER — Observation Stay (HOSPITAL_COMMUNITY): Payer: 59

## 2013-10-15 ENCOUNTER — Encounter (HOSPITAL_COMMUNITY): Payer: Self-pay | Admitting: Emergency Medicine

## 2013-10-15 DIAGNOSIS — R109 Unspecified abdominal pain: Secondary | ICD-10-CM

## 2013-10-15 DIAGNOSIS — I10 Essential (primary) hypertension: Secondary | ICD-10-CM | POA: Insufficient documentation

## 2013-10-15 DIAGNOSIS — J45909 Unspecified asthma, uncomplicated: Secondary | ICD-10-CM | POA: Insufficient documentation

## 2013-10-15 DIAGNOSIS — K819 Cholecystitis, unspecified: Secondary | ICD-10-CM

## 2013-10-15 DIAGNOSIS — K8010 Calculus of gallbladder with chronic cholecystitis without obstruction: Secondary | ICD-10-CM | POA: Insufficient documentation

## 2013-10-15 DIAGNOSIS — E785 Hyperlipidemia, unspecified: Secondary | ICD-10-CM | POA: Insufficient documentation

## 2013-10-15 DIAGNOSIS — E119 Type 2 diabetes mellitus without complications: Secondary | ICD-10-CM

## 2013-10-15 DIAGNOSIS — Z885 Allergy status to narcotic agent status: Secondary | ICD-10-CM | POA: Insufficient documentation

## 2013-10-15 DIAGNOSIS — F411 Generalized anxiety disorder: Secondary | ICD-10-CM | POA: Insufficient documentation

## 2013-10-15 DIAGNOSIS — K8000 Calculus of gallbladder with acute cholecystitis without obstruction: Principal | ICD-10-CM | POA: Insufficient documentation

## 2013-10-15 DIAGNOSIS — K810 Acute cholecystitis: Secondary | ICD-10-CM

## 2013-10-15 DIAGNOSIS — K5732 Diverticulitis of large intestine without perforation or abscess without bleeding: Secondary | ICD-10-CM | POA: Insufficient documentation

## 2013-10-15 DIAGNOSIS — Z87891 Personal history of nicotine dependence: Secondary | ICD-10-CM | POA: Insufficient documentation

## 2013-10-15 DIAGNOSIS — I4949 Other premature depolarization: Secondary | ICD-10-CM | POA: Insufficient documentation

## 2013-10-15 DIAGNOSIS — I509 Heart failure, unspecified: Secondary | ICD-10-CM | POA: Insufficient documentation

## 2013-10-15 DIAGNOSIS — K8020 Calculus of gallbladder without cholecystitis without obstruction: Secondary | ICD-10-CM

## 2013-10-15 HISTORY — DX: Ventricular premature depolarization: I49.3

## 2013-10-15 LAB — COMPREHENSIVE METABOLIC PANEL
ALT: 14 U/L (ref 0–35)
AST: 20 U/L (ref 0–37)
Alkaline Phosphatase: 141 U/L — ABNORMAL HIGH (ref 39–117)
CO2: 22 mEq/L (ref 19–32)
Chloride: 100 mEq/L (ref 96–112)
Creatinine, Ser: 0.91 mg/dL (ref 0.50–1.10)
GFR calc Af Amer: 78 mL/min — ABNORMAL LOW (ref >90)
GFR calc non Af Amer: 67 mL/min — ABNORMAL LOW (ref >90)
Glucose, Bld: 146 mg/dL — ABNORMAL HIGH (ref 70–99)
Potassium: 4 mEq/L (ref 3.5–5.1)
Total Bilirubin: 0.3 mg/dL (ref 0.3–1.2)

## 2013-10-15 LAB — POCT I-STAT TROPONIN I

## 2013-10-15 LAB — URINALYSIS, ROUTINE W REFLEX MICROSCOPIC
Bilirubin Urine: NEGATIVE
Glucose, UA: NEGATIVE mg/dL
Hgb urine dipstick: NEGATIVE
Ketones, ur: NEGATIVE mg/dL
Protein, ur: NEGATIVE mg/dL
Specific Gravity, Urine: 1.02 (ref 1.005–1.030)
Urobilinogen, UA: 0.2 mg/dL (ref 0.0–1.0)

## 2013-10-15 LAB — CBC WITH DIFFERENTIAL/PLATELET
Basophils Absolute: 0 10*3/uL (ref 0.0–0.1)
HCT: 45.5 % (ref 36.0–46.0)
Hemoglobin: 15.2 g/dL — ABNORMAL HIGH (ref 12.0–15.0)
Lymphocytes Relative: 12 % (ref 12–46)
Monocytes Absolute: 0.5 10*3/uL (ref 0.1–1.0)
Monocytes Relative: 2 % — ABNORMAL LOW (ref 3–12)
Neutro Abs: 16.5 10*3/uL — ABNORMAL HIGH (ref 1.7–7.7)
Neutrophils Relative %: 85 % — ABNORMAL HIGH (ref 43–77)
RDW: 12.6 % (ref 11.5–15.5)
WBC: 19.3 10*3/uL — ABNORMAL HIGH (ref 4.0–10.5)

## 2013-10-15 LAB — GLUCOSE, CAPILLARY: Glucose-Capillary: 126 mg/dL — ABNORMAL HIGH (ref 70–99)

## 2013-10-15 LAB — LIPASE, BLOOD: Lipase: 33 U/L (ref 11–59)

## 2013-10-15 MED ORDER — HYDROCHLOROTHIAZIDE 12.5 MG PO CAPS
12.5000 mg | ORAL_CAPSULE | Freq: Every day | ORAL | Status: DC
  Administered 2013-10-17 – 2013-10-19 (×3): 12.5 mg via ORAL
  Filled 2013-10-15 (×5): qty 1

## 2013-10-15 MED ORDER — VALSARTAN-HYDROCHLOROTHIAZIDE 160-12.5 MG PO TABS: 1.0000 | ORAL_TABLET | Freq: Every day | ORAL | Status: DC

## 2013-10-15 MED ORDER — PANTOPRAZOLE SODIUM 40 MG IV SOLR
40.0000 mg | Freq: Once | INTRAVENOUS | Status: AC
  Administered 2013-10-15: 40 mg via INTRAVENOUS
  Filled 2013-10-15: qty 40

## 2013-10-15 MED ORDER — HYDROMORPHONE HCL PF 1 MG/ML IJ SOLN
1.0000 mg | Freq: Once | INTRAMUSCULAR | Status: AC
  Administered 2013-10-15: 1 mg via INTRAVENOUS
  Filled 2013-10-15: qty 1

## 2013-10-15 MED ORDER — IRBESARTAN 150 MG PO TABS
150.0000 mg | ORAL_TABLET | Freq: Every day | ORAL | Status: DC
  Administered 2013-10-17 – 2013-10-19 (×3): 150 mg via ORAL
  Filled 2013-10-15 (×4): qty 1

## 2013-10-15 MED ORDER — ATORVASTATIN CALCIUM 80 MG PO TABS
80.0000 mg | ORAL_TABLET | Freq: Every day | ORAL | Status: DC
  Filled 2013-10-15 (×6): qty 1

## 2013-10-15 MED ORDER — PROMETHAZINE HCL 25 MG/ML IJ SOLN
25.0000 mg | Freq: Four times a day (QID) | INTRAMUSCULAR | Status: DC | PRN
  Administered 2013-10-15: 25 mg via INTRAVENOUS
  Administered 2013-10-18: 12.5 mg via INTRAVENOUS
  Filled 2013-10-15 (×2): qty 1

## 2013-10-15 MED ORDER — INSULIN ASPART 100 UNIT/ML ~~LOC~~ SOLN
0.0000 [IU] | Freq: Three times a day (TID) | SUBCUTANEOUS | Status: DC
  Administered 2013-10-15: 2 [IU] via SUBCUTANEOUS

## 2013-10-15 MED ORDER — DEXTROSE 5 % IV SOLN
1.0000 g | Freq: Four times a day (QID) | INTRAVENOUS | Status: DC
  Administered 2013-10-15 – 2013-10-17 (×7): 1 g via INTRAVENOUS
  Filled 2013-10-15 (×8): qty 1

## 2013-10-15 MED ORDER — SODIUM CHLORIDE 0.9 % IV BOLUS (SEPSIS)
1000.0000 mL | Freq: Once | INTRAVENOUS | Status: AC
  Administered 2013-10-15: 1000 mL via INTRAVENOUS

## 2013-10-15 MED ORDER — METOPROLOL TARTRATE 25 MG PO TABS
25.0000 mg | ORAL_TABLET | Freq: Two times a day (BID) | ORAL | Status: DC
  Administered 2013-10-15 – 2013-10-19 (×8): 25 mg via ORAL
  Filled 2013-10-15 (×13): qty 1

## 2013-10-15 MED ORDER — PIPERACILLIN-TAZOBACTAM 3.375 G IVPB
3.3750 g | Freq: Once | INTRAVENOUS | Status: AC
  Administered 2013-10-15: 3.375 g via INTRAVENOUS
  Filled 2013-10-15: qty 50

## 2013-10-15 MED ORDER — HYDROMORPHONE HCL PF 1 MG/ML IJ SOLN
1.0000 mg | INTRAMUSCULAR | Status: DC | PRN
  Administered 2013-10-15 – 2013-10-18 (×7): 1 mg via INTRAVENOUS
  Filled 2013-10-15 (×7): qty 1

## 2013-10-15 MED ORDER — ONDANSETRON HCL 4 MG/2ML IJ SOLN
4.0000 mg | Freq: Four times a day (QID) | INTRAMUSCULAR | Status: DC | PRN
  Administered 2013-10-16 – 2013-10-18 (×4): 4 mg via INTRAVENOUS
  Filled 2013-10-15 (×4): qty 2

## 2013-10-15 MED ORDER — ENOXAPARIN SODIUM 40 MG/0.4ML ~~LOC~~ SOLN
40.0000 mg | SUBCUTANEOUS | Status: DC
  Filled 2013-10-15 (×2): qty 0.4

## 2013-10-15 MED ORDER — ONDANSETRON HCL 4 MG/2ML IJ SOLN
4.0000 mg | Freq: Once | INTRAMUSCULAR | Status: AC
  Administered 2013-10-15: 4 mg via INTRAVENOUS
  Filled 2013-10-15: qty 2

## 2013-10-15 MED ORDER — POTASSIUM CHLORIDE IN NACL 20-0.9 MEQ/L-% IV SOLN
INTRAVENOUS | Status: DC
  Administered 2013-10-15 – 2013-10-17 (×2): via INTRAVENOUS
  Filled 2013-10-15 (×5): qty 1000

## 2013-10-15 NOTE — H&P (Signed)
HISTORY AND PHYSICAL EXAM  Chief Complaint: Abdominal pain  HPI: Christina Campos is a 62 year old female with a history of CHF, HTN, HLD, anxiety, diabetes mellitus(diet controlled), PVCs who presented to [facility] today with abdominal pain.  Duration of symptoms is 1 day.  Onset was sudden this morning upon awakening. Location is generalized.  Moderate to severe in severity.  Time pattern is intermittent.  Characterized as sharp stabbing pain.  Associated with pain between shoulder, nausea, vomiting, diarrhea, fever, chills and sweats.  Modifying factors include; clonazepam, pepto-bismol, zofran and ginger ale without any relief.  Exacerbating factors include; eating and movement.  Alleviating factors; dilaudid given in the ED.  Past surgeries include abdominal hysterectomy.  Denies history of UC, Crohn's diease or cancer.  She takes an aspirin per cardiology, no other anticoagulation.  She underwent a cardiac catheterization by Dr. Sw[place]and which showed normal coronary anatomy and normal LV function.  She appears comfortable at present time.  Continues to require pain medication.  She has a white count of 19K. A abdominal US  Shows gallstones, GB wall thickening.  She has normal LFTs.    Past Medical History  Diagnosis Date  . Hypertension   . Hyperlipidemia   . Anxiety   . Diverticulitis   . Diabetes mellitus without complication     gestational  . Rotator cuff tear   . Asthma   . PVC (premature ventricular contraction)   . CHF (congestive heart failure)     Past Surgical History  Procedure Laterality Date  . Abdominal hysterectomy    . Tmj repair      Family History  Problem Relation Age of Onset  . Arrhythmia Brother   . Arrhythmia Brother   . Heart disease Father   . Hypertension Father   . Heart attack Father   . Diabetes Mother   . Diabetes Maternal Grandfather   . Diabetes Paternal Grandmother    Social History:  reports that she has quit smoking. She does not have any smokeless  tobacco history on file. She reports that she drinks about 0.6 ounces of alcohol per week. She reports that she does not use illicit drugs.  Allergies:  Allergies  Allergen Reactions  . Codeine Shortness Of Breath and Swelling    unknown     (Not in a hospital admission)  Results for orders placed during the hospital encounter of 10/15/13 (from the past 48 hour(s))  CBC WITH DIFFERENTIAL     Status: Abnormal   Collection Time    10/15/13 12:10 PM      Result Value Range   WBC 19.3 (*) 4.0 - 10.5 K/uL   RBC 5.08  3.87 - 5.11 MIL/uL   Hemoglobin 15.2 (*) 12.0 - 15.0 g/dL   HCT 16.1  09.6 - 04.5 %   MCV 89.6  78.0 - 100.0 fL   MCH 29.9  26.0 - 34.0 pg   MCHC 33.4  30.0 - 36.0 g/dL   RDW 40.9  81.1 - 91.4 %   Platelets 330  150 - 400 K/uL   Neutrophils Relative % 85 (*) 43 - 77 %   Neutro Abs 16.5 (*) 1.7 - 7.7 K/uL   Lymphocytes Relative 12  12 - 46 %   Lymphs Abs 2.3  0.7 - 4.0 K/uL   Monocytes Relative 2 (*) 3 - 12 %   Monocytes Absolute 0.5  0.1 - 1.0 K/uL   Eosinophils Relative 0  0 - 5 %   Eosinophils  Absolute 0.1  0.0 - 0.7 K/uL   Basophils Relative 0  0 - 1 %   Basophils Absolute 0.0  0.0 - 0.1 K/uL  COMPREHENSIVE METABOLIC PANEL     Status: Abnormal   Collection Time    10/15/13 12:10 PM      Result Value Range   Sodium 139  135 - 145 mEq/L   Potassium 4.0  3.5 - 5.1 mEq/L   Chloride 100  96 - 112 mEq/L   CO2 22  19 - 32 mEq/L   Glucose, Bld 146 (*) 70 - 99 mg/dL   BUN 18  6 - 23 mg/dL   Creatinine, Ser 1.61  0.50 - 1.10 mg/dL   Calcium 09.6  8.4 - 04.5 mg/dL   Total Protein 9.0 (*) 6.0 - 8.3 g/dL   Albumin 4.7  3.5 - 5.2 g/dL   AST 20  0 - 37 U/L   ALT 14  0 - 35 U/L   Alkaline Phosphatase 141 (*) 39 - 117 U/L   Total Bilirubin 0.3  0.3 - 1.2 mg/dL   GFR calc non Af Amer 67 (*) >90 mL/min   GFR calc Af Amer 78 (*) >90 mL/min   Comment: (NOTE)     The eGFR has been calculated using the CKD EPI equation.     This calculation has not been validated in all  clinical situations.     eGFR's persistently <90 mL/min signify possible Chronic Kidney     Disease.  LIPASE, BLOOD     Status: None   Collection Time    10/15/13 12:10 PM      Result Value Range   Lipase 33  11 - 59 U/L  POCT I-STAT TROPONIN I     Status: None   Collection Time    10/15/13 12:34 PM      Result Value Range   Troponin i, poc 0.01  0.00 - 0.08 ng/mL   Comment 3            Comment: Due to the release kinetics of cTnI,     a negative result within the first hours     of the onset of symptoms does not rule out     myocardial infarction with certainty.     If myocardial infarction is still suspected,     repeat the test at appropriate intervals.   US Abdomen Complete  10/15/2013   CLINICAL DATA:  History of abdominal pain.  History of hypertension.  EXAM: ULTRASOUND ABDOMEN COMPLETE  COMPARISON:  None.  FINDINGS: Gallbladder  There is cholelithiasis. There is a mobile 2 cm calculus within the gallbladder. There is thickening of the gallbladder wall. Gallbladder wall thickness measures 6.7 mm. No pericholecystic fluid is evident. No positive sonographic Murphy sign noted.  Common bile duct  Diameter: 2.9 mm. No choledocholithiasis or dilatation of intrahepatic branching bile ducts is evident.  Liver  No focal lesion identified. Within normal limits in parenchymal echogenicity. Portal vein is patent with hepatopetal flow.  IVC  No abnormality visualized.  Pancreas  Visualized portion unremarkable.  Spleen  Size and appearance within normal limits.  Length is 8 cm.  Right Kidney  Length: Right renal length is 11.6 cm. Echogenicity within normal limits. No mass or hydronephrosis visualized.  Left Kidney  Length: Left renal length is 11.6 cm. Echogenicity within normal limits. No mass or hydronephrosis visualized.  Abdominal aorta  No aneurysm visualized.  Maximum diameter is 2.2 cm.  IMPRESSION: There is  cholelithiasis. There is a mobile 2 cm calculus within the gallbladder. There is  thickening of the gallbladder wall. Gallbladder wall thickness measures 6.7 mm. No pericholecystic fluid is evident. No positive sonographic Murphy sign noted.  Bile ducts appear normal.  No other abnormalities identified.   Electronically Signed   By: Onalee Hua  Call M.D.   On: 10/15/2013 14:00    Review of Systems  Constitutional: Positive for fever, chills, malaise/fatigue and diaphoresis. Negative for weight loss.  Eyes: Negative for blurred vision, double vision and photophobia.  Respiratory: Negative for shortness of breath and wheezing.   Cardiovascular: Negative for chest pain, palpitations, leg swelling and PND.  Gastrointestinal: Positive for nausea, vomiting and abdominal pain. Negative for constipation, blood in stool and melena.  Genitourinary: Negative for dysuria, urgency and hematuria.  Musculoskeletal: Positive for back pain. Negative for myalgias.  Neurological: Negative for dizziness, tingling, tremors, loss of consciousness, weakness and headaches.  Psychiatric/Behavioral: The patient is not nervous/anxious.   All other systems reviewed and are negative.    Blood pressure 132/61, pulse 89, temperature 97.5 F (36.4 C), temperature source Oral, resp. rate 12, weight 214 lb 9.6 oz (97.342 kg), SpO2 100.00%. Physical Exam  Constitutional: She is oriented to person, place, and time. She appears well-developed and well-nourished. No distress.  HENT:  Head: Normocephalic and atraumatic.  Mouth/Throat: No oropharyngeal exudate.  Eyes: Conjunctivae and EOM are normal. No scleral icterus.  Neck: Neck supple. No thyromegaly present.  Cardiovascular: Normal rate, regular rhythm, normal heart sounds and intact distal pulses.  Exam reveals no gallop and no friction rub.   No murmur heard. Respiratory: Effort normal and breath sounds normal. No respiratory distress. She has no wheezes. She has no rales. She exhibits no tenderness.  GI: Soft. Bowel sounds are normal. She exhibits no  distension and no mass. There is no rebound and no guarding.  Generalized TTP, negative Murphy's sign  Musculoskeletal: She exhibits no edema and no tenderness.  Lymphadenopathy:    She has no cervical adenopathy.  Neurological: She is alert and oriented to person, place, and time.  Skin: Skin is warm and dry. No rash noted. She is not diaphoretic. No erythema. No pallor.  Psychiatric: She has a normal mood and affect. Her behavior is normal. Judgment and thought content normal.     Assessment/Plan Acute cholecystitis  -admit for laparoscopic cholecystectomy tomorrow  -clear liquid diet, NPO after midnight -antibiotics: mefoxin.  Indication: as above.  Duration: TBD -IVF -pain control -antiemetics Hypertension -continue with metoprolol, diovan Diabetes mellitus -diet controlled, monitor CBGs, sliding scale PVCs -cardiac monitoring -resume ASA post op HLD -statin VTE prophylaxis -SCDs, ambulate, lovenox  Emeric Novinger ANP-BC 10/15/2013, 3:19 PM

## 2013-10-15 NOTE — ED Notes (Signed)
Patient transported to Ultrasound 

## 2013-10-15 NOTE — H&P (Signed)
Agree with above 

## 2013-10-15 NOTE — ED Notes (Signed)
Pt belongings bagged and labeled and sent to [facility] with pt.

## 2013-10-15 NOTE — ED Provider Notes (Signed)
CSN: [id number]     Arrival date & time 10/15/13  1143 History   First MD Initiated Contact with Patient 10/15/13 1159     Chief Complaint  Patient presents with  . Abdominal Pain   (Consider location/radiation/quality/duration/timing/severity/associated sxs/prior Treatment) Patient is a 62 y.o. female presenting with abdominal pain. The history is provided by the patient.  Abdominal Pain Associated symptoms: nausea and vomiting   Associated symptoms: no chest pain, no chills, no constipation, no cough, no diarrhea, no dysuria, no fever, no shortness of breath and no sore throat   pt c/o acute onset epigastric pain this morning, constant but waxes and wanes in severity. Dull. Mod-severe, non radiating, no specific exacerbating or allev factors. +nv, clear, not bloody or bilious. Denies fever or chills. No hx same pain. No hx pud, gallstones or pancreatitis. Having normal bms, no abd distension or prior abd surgery. No cp or sob. No cough or uri c/o.     Past Medical History  Diagnosis Date  . Hypertension   . Hyperlipidemia   . Anxiety   . Diverticulitis   . Diabetes mellitus without complication     gestational  . Rotator cuff tear   . Asthma   . PVC (premature ventricular contraction)   . CHF (congestive heart failure)    Past Surgical History  Procedure Laterality Date  . Abdominal hysterectomy    . Tmj repair     Family History  Problem Relation Age of Onset  . Arrhythmia Brother   . Arrhythmia Brother   . Heart disease Father   . Hypertension Father    History  Substance Use Topics  . Smoking status: Former Games developer  . Smokeless tobacco: Not on file  . Alcohol Use: Yes     Comment: freq   OB History   Grav Para Term Preterm Abortions TAB SAB Ect Mult Living                 Review of Systems  Constitutional: Negative for fever and chills.  HENT: Negative for sore throat.   Eyes: Negative for redness.  Respiratory: Negative for cough and shortness of breath.    Cardiovascular: Negative for chest pain.  Gastrointestinal: Positive for nausea, vomiting and abdominal pain. Negative for diarrhea and constipation.  Genitourinary: Negative for dysuria and flank pain.  Musculoskeletal: Negative for back pain and neck pain.  Skin: Negative for rash.  Neurological: Negative for headaches.  Hematological: Does not bruise/bleed easily.  Psychiatric/Behavioral: Negative for confusion.    Allergies  Codeine  Home Medications   Current Outpatient Rx  Name  Route  Sig  Dispense  Refill  . aspirin EC 81 MG tablet   Oral   Take 1 tablet (81 mg total) by mouth daily.   90 tablet   3   . Coenzyme Q10 (COQ10) 200 MG CAPS   Oral   Take by mouth every morning.         . estrogens, conjugated, (PREMARIN) 1.25 MG tablet   Oral   Take 1.25 mg by mouth daily.         Marland Kitchen ibuprofen (ADVIL,MOTRIN) 200 MG tablet   Oral   Take 200 mg by mouth every 6 (six) hours as needed for pain.         . metoprolol tartrate (LOPRESSOR) 25 MG tablet   Oral   Take 1 tablet (25 mg total) by mouth 2 (two) times daily.   180 tablet   3   .  potassium chloride SA (K-DUR,KLOR-CON) 20 MEQ tablet   Oral   Take 1 tablet (20 mEq total) by mouth daily.   90 tablet   3   . rosuvastatin (CRESTOR) 10 MG tablet   Oral   Take 10 mg by mouth daily.         . valsartan (DIOVAN) 160 MG tablet   Oral   Take 160 mg by mouth daily.          BP 124/69  Pulse 108  Temp(Src) 97.5 F (36.4 C) (Oral)  Resp 16  Wt 214 lb 9.6 oz (97.342 kg)  SpO2 97% Physical Exam  Nursing note and vitals reviewed. Constitutional: She appears well-developed and well-nourished. No distress.  HENT:  Mouth/Throat: Oropharynx is clear and moist.  Eyes: Conjunctivae are normal. No scleral icterus.  Neck: Neck supple. No tracheal deviation present.  Cardiovascular: Regular rhythm, normal heart sounds and intact distal pulses.  Exam reveals no gallop and no friction rub.   No murmur  heard. Pulmonary/Chest: Effort normal and breath sounds normal. No respiratory distress.  Abdominal: Soft. Normal appearance and bowel sounds are normal. She exhibits no distension and no mass. There is tenderness. There is no rebound and no guarding.  Epigastric tenderness, no rebound or guarding.   Genitourinary:  No cva tenderness  Musculoskeletal: She exhibits no edema.  Neurological: She is alert.  Skin: Skin is warm and dry. No rash noted.  Psychiatric: She has a normal mood and affect.    ED Course  Procedures (including critical care time)  Results for orders placed during the hospital encounter of 10/15/13  CBC WITH DIFFERENTIAL      Result Value Range   WBC 19.3 (*) 4.0 - 10.5 K/uL   RBC 5.08  3.87 - 5.11 MIL/uL   Hemoglobin 15.2 (*) 12.0 - 15.0 g/dL   HCT 45.4  09.8 - 11.9 %   MCV 89.6  78.0 - 100.0 fL   MCH 29.9  26.0 - 34.0 pg   MCHC 33.4  30.0 - 36.0 g/dL   RDW 14.7  82.9 - 56.2 %   Platelets 330  150 - 400 K/uL   Neutrophils Relative % 85 (*) 43 - 77 %   Neutro Abs 16.5 (*) 1.7 - 7.7 K/uL   Lymphocytes Relative 12  12 - 46 %   Lymphs Abs 2.3  0.7 - 4.0 K/uL   Monocytes Relative 2 (*) 3 - 12 %   Monocytes Absolute 0.5  0.1 - 1.0 K/uL   Eosinophils Relative 0  0 - 5 %   Eosinophils Absolute 0.1  0.0 - 0.7 K/uL   Basophils Relative 0  0 - 1 %   Basophils Absolute 0.0  0.0 - 0.1 K/uL  COMPREHENSIVE METABOLIC PANEL      Result Value Range   Sodium 139  135 - 145 mEq/L   Potassium 4.0  3.5 - 5.1 mEq/L   Chloride 100  96 - 112 mEq/L   CO2 22  19 - 32 mEq/L   Glucose, Bld 146 (*) 70 - 99 mg/dL   BUN 18  6 - 23 mg/dL   Creatinine, Ser 1.30  0.50 - 1.10 mg/dL   Calcium 86.5  8.4 - 78.4 mg/dL   Total Protein 9.0 (*) 6.0 - 8.3 g/dL   Albumin 4.7  3.5 - 5.2 g/dL   AST 20  0 - 37 U/L   ALT 14  0 - 35 U/L   Alkaline Phosphatase 141 (*)  39 - 117 U/L   Total Bilirubin 0.3  0.3 - 1.2 mg/dL   GFR calc non Af Amer 67 (*) >90 mL/min   GFR calc Af Amer 78 (*) >90 mL/min   LIPASE, BLOOD      Result Value Range   Lipase 33  11 - 59 U/L  POCT I-STAT TROPONIN I      Result Value Range   Troponin i, poc 0.01  0.00 - 0.08 ng/mL   Comment 3            US Abdomen Complete  10/15/2013   CLINICAL DATA:  History of abdominal pain.  History of hypertension.  EXAM: ULTRASOUND ABDOMEN COMPLETE  COMPARISON:  None.  FINDINGS: Gallbladder  There is cholelithiasis. There is a mobile 2 cm calculus within the gallbladder. There is thickening of the gallbladder wall. Gallbladder wall thickness measures 6.7 mm. No pericholecystic fluid is evident. No positive sonographic Murphy sign noted.  Common bile duct  Diameter: 2.9 mm. No choledocholithiasis or dilatation of intrahepatic branching bile ducts is evident.  Liver  No focal lesion identified. Within normal limits in parenchymal echogenicity. Portal vein is patent with hepatopetal flow.  IVC  No abnormality visualized.  Pancreas  Visualized portion unremarkable.  Spleen  Size and appearance within normal limits.  Length is 8 cm.  Right Kidney  Length: Right renal length is 11.6 cm. Echogenicity within normal limits. No mass or hydronephrosis visualized.  Left Kidney  Length: Left renal length is 11.6 cm. Echogenicity within normal limits. No mass or hydronephrosis visualized.  Abdominal aorta  No aneurysm visualized.  Maximum diameter is 2.2 cm.  IMPRESSION: There is cholelithiasis. There is a mobile 2 cm calculus within the gallbladder. There is thickening of the gallbladder wall. Gallbladder wall thickness measures 6.7 mm. No pericholecystic fluid is evident. No positive sonographic Murphy sign noted.  Bile ducts appear normal.  No other abnormalities identified.   Electronically Signed   By: Onalee Hua  Call M.D.   On: 10/15/2013 14:00       10/15/2013  EKG Interpretation    Date/Time:  Thursday October 15 2013 11:47:53 EST Ventricular Rate:  107 PR Interval:  158 QRS Duration: 82 QT Interval:  372 QTC Calculation: 496 R  Axis:   12 Text Interpretation:  Sinus tachycardia with occasional Premature ventricular complexes Otherwise normal ECG Confirmed by Gaylyn Berish  MD, Leodis Alcocer ([id number]) on 10/15/2013 12:00:46 PM            MDM  Iv ns bolus. zofran iv. Dilaudid iv. protonix iv.   Labs.  Reviewed nursing notes and prior charts for additional history.   Reviewed prior charts, pt w normal cardiac cath earlier this month.   Zosyn iv.   Recheck pain and nausea returns, dilaudid iv, zofran iv.  Recheck abd no change from prior.  gen surgery called to see/admit, re cholelithiasis, persistent pain, possible cholecystitis.     Suzi Roots, MD 10/15/13 [phone]

## 2013-10-15 NOTE — ED Notes (Signed)
Reports onset this am of severe epigastric pain, n/v, diaphoresis. ekg done at triage. Denies diarrhea. Last bm was yesterday.

## 2013-10-15 NOTE — ED Notes (Signed)
Surgeon at bedside.  

## 2013-10-16 ENCOUNTER — Encounter (HOSPITAL_COMMUNITY): Payer: Self-pay | Admitting: Certified Registered"

## 2013-10-16 ENCOUNTER — Observation Stay (HOSPITAL_COMMUNITY): Payer: 59 | Admitting: Anesthesiology

## 2013-10-16 ENCOUNTER — Encounter (HOSPITAL_COMMUNITY)
Admission: EM | Disposition: A | Discharge status: Home / Self Care | Payer: Self-pay | Source: Home / Self Care | Attending: Emergency Medicine

## 2013-10-16 ENCOUNTER — Encounter (HOSPITAL_COMMUNITY): Payer: 59 | Admitting: Anesthesiology

## 2013-10-16 DIAGNOSIS — K8010 Calculus of gallbladder with chronic cholecystitis without obstruction: Secondary | ICD-10-CM

## 2013-10-16 HISTORY — PX: CHOLECYSTECTOMY: SHX55

## 2013-10-16 LAB — SURGICAL PCR SCREEN
MRSA, PCR: NEGATIVE
Staphylococcus aureus: NEGATIVE

## 2013-10-16 LAB — COMPREHENSIVE METABOLIC PANEL
AST: 11 U/L (ref 0–37)
Albumin: 3.3 g/dL — ABNORMAL LOW (ref 3.5–5.2)
Alkaline Phosphatase: 105 U/L (ref 39–117)
Calcium: 8.6 mg/dL (ref 8.4–10.5)
Creatinine, Ser: 1.04 mg/dL (ref 0.50–1.10)
Glucose, Bld: 108 mg/dL — ABNORMAL HIGH (ref 70–99)
Total Protein: 6.5 g/dL (ref 6.0–8.3)

## 2013-10-16 LAB — GLUCOSE, CAPILLARY
Glucose-Capillary: 92 mg/dL (ref 70–99)
Glucose-Capillary: 96 mg/dL (ref 70–99)

## 2013-10-16 SURGERY — LAPAROSCOPIC CHOLECYSTECTOMY WITH INTRAOPERATIVE CHOLANGIOGRAM
Anesthesia: General | Site: Abdomen | Wound class: Clean Contaminated

## 2013-10-16 MED ORDER — MIDAZOLAM HCL 5 MG/5ML IJ SOLN
INTRAMUSCULAR | Status: DC | PRN
  Administered 2013-10-16: 2 mg via INTRAVENOUS

## 2013-10-16 MED ORDER — SODIUM CHLORIDE 0.9 % IV SOLN
INTRAVENOUS | Status: DC | PRN
  Administered 2013-10-16: 11:00:00

## 2013-10-16 MED ORDER — LIDOCAINE HCL (CARDIAC) 20 MG/ML IV SOLN
INTRAVENOUS | Status: DC | PRN
  Administered 2013-10-16: 80 mg via INTRAVENOUS

## 2013-10-16 MED ORDER — ONDANSETRON HCL 4 MG/2ML IJ SOLN
INTRAMUSCULAR | Status: DC | PRN
  Administered 2013-10-16: 4 mg via INTRAVENOUS

## 2013-10-16 MED ORDER — PROMETHAZINE HCL 25 MG/ML IJ SOLN: 6.2500 mg | INTRAMUSCULAR | Status: DC | PRN

## 2013-10-16 MED ORDER — NEOSTIGMINE METHYLSULFATE 1 MG/ML IJ SOLN
INTRAMUSCULAR | Status: DC | PRN
  Administered 2013-10-16: 4 mg via INTRAVENOUS

## 2013-10-16 MED ORDER — LACTATED RINGERS IV SOLN
INTRAVENOUS | Status: DC | PRN
  Administered 2013-10-16: 11:00:00 via INTRAVENOUS

## 2013-10-16 MED ORDER — ROCURONIUM BROMIDE 100 MG/10ML IV SOLN
INTRAVENOUS | Status: DC | PRN
  Administered 2013-10-16: 50 mg via INTRAVENOUS

## 2013-10-16 MED ORDER — ACETAMINOPHEN 325 MG PO TABS: 650.0000 mg | ORAL_TABLET | Freq: Four times a day (QID) | ORAL | Status: DC | PRN

## 2013-10-16 MED ORDER — BUPIVACAINE HCL (PF) 0.25 % IJ SOLN
INTRAMUSCULAR | Status: AC
  Filled 2013-10-16: qty 30

## 2013-10-16 MED ORDER — 0.9 % SODIUM CHLORIDE (POUR BTL) OPTIME
TOPICAL | Status: DC | PRN
  Administered 2013-10-16: 1000 mL

## 2013-10-16 MED ORDER — GLYCOPYRROLATE 0.2 MG/ML IJ SOLN
INTRAMUSCULAR | Status: DC | PRN
  Administered 2013-10-16: 0.6 mg via INTRAVENOUS

## 2013-10-16 MED ORDER — PROPOFOL 10 MG/ML IV BOLUS
INTRAVENOUS | Status: DC | PRN
  Administered 2013-10-16: 170 mg via INTRAVENOUS

## 2013-10-16 MED ORDER — TRAMADOL HCL 50 MG PO TABS
50.0000 mg | ORAL_TABLET | Freq: Four times a day (QID) | ORAL | Status: DC | PRN
  Administered 2013-10-16 – 2013-10-18 (×4): 50 mg via ORAL
  Filled 2013-10-16 (×5): qty 1

## 2013-10-16 MED ORDER — ARTIFICIAL TEARS OP OINT
TOPICAL_OINTMENT | OPHTHALMIC | Status: DC | PRN
  Administered 2013-10-16: 1 via OPHTHALMIC

## 2013-10-16 MED ORDER — HYDROMORPHONE HCL PF 1 MG/ML IJ SOLN
INTRAMUSCULAR | Status: AC
  Administered 2013-10-16: 0.5 mg via INTRAVENOUS
  Filled 2013-10-16: qty 1

## 2013-10-16 MED ORDER — FENTANYL CITRATE 0.05 MG/ML IJ SOLN
INTRAMUSCULAR | Status: DC | PRN
  Administered 2013-10-16: 50 ug via INTRAVENOUS
  Administered 2013-10-16: 100 ug via INTRAVENOUS
  Administered 2013-10-16: 50 ug via INTRAVENOUS

## 2013-10-16 MED ORDER — BUPIVACAINE-EPINEPHRINE 0.25% -1:200000 IJ SOLN
INTRAMUSCULAR | Status: DC | PRN
  Administered 2013-10-16: 19 mL

## 2013-10-16 MED ORDER — HYDROMORPHONE HCL PF 1 MG/ML IJ SOLN
0.2500 mg | INTRAMUSCULAR | Status: DC | PRN
  Administered 2013-10-16 (×2): 0.5 mg via INTRAVENOUS

## 2013-10-16 MED ORDER — OXYCODONE-ACETAMINOPHEN 5-325 MG PO TABS
1.0000 | ORAL_TABLET | ORAL | Status: DC | PRN
  Administered 2013-10-16 (×2): 1 via ORAL
  Administered 2013-10-17 – 2013-10-19 (×9): 2 via ORAL
  Filled 2013-10-16 (×4): qty 2
  Filled 2013-10-16: qty 1
  Filled 2013-10-16 (×6): qty 2

## 2013-10-16 MED ORDER — SODIUM CHLORIDE 0.9 % IR SOLN
Status: DC | PRN
  Administered 2013-10-16: 1000 mL

## 2013-10-16 SURGICAL SUPPLY — 38 items
ADH SKN CLS APL DERMABOND .7 (GAUZE/BANDAGES/DRESSINGS) ×1
APPLIER CLIP 5 13 M/L LIGAMAX5 (MISCELLANEOUS) ×2
APR CLP MED LRG 5 ANG JAW (MISCELLANEOUS) ×1
BAG SPEC RTRVL 10 TROC 200 (ENDOMECHANICALS) ×1
CANISTER SUCTION 2500CC (MISCELLANEOUS) ×2 IMPLANT
CHLORAPREP W/TINT 26ML (MISCELLANEOUS) ×2 IMPLANT
CLIP APPLIE 5 13 M/L LIGAMAX5 (MISCELLANEOUS) ×1 IMPLANT
COVER MAYO STAND STRL (DRAPES) ×2 IMPLANT
COVER SURGICAL LIGHT HANDLE (MISCELLANEOUS) ×2 IMPLANT
DERMABOND ADVANCED (GAUZE/BANDAGES/DRESSINGS) ×1
DERMABOND ADVANCED .7 DNX12 (GAUZE/BANDAGES/DRESSINGS) ×1 IMPLANT
DRAPE C-ARM 42X72 X-RAY (DRAPES) ×2 IMPLANT
ELECT REM PT RETURN 9FT ADLT (ELECTROSURGICAL) ×2
ELECTRODE REM PT RTRN 9FT ADLT (ELECTROSURGICAL) ×1 IMPLANT
GLOVE BIO SURGEON STRL SZ7 (GLOVE) ×3 IMPLANT
GLOVE BIOGEL PI IND STRL 7.5 (GLOVE) ×1 IMPLANT
GLOVE BIOGEL PI INDICATOR 7.5 (GLOVE) ×4
GLOVE SURG SS PI 7.0 STRL IVOR (GLOVE) ×1 IMPLANT
GLOVE SURG SS PI 7.5 STRL IVOR (GLOVE) ×3 IMPLANT
GOWN STRL NON-REIN LRG LVL3 (GOWN DISPOSABLE) ×8 IMPLANT
HEMOSTAT SNOW SURGICEL 2X4 (HEMOSTASIS) ×1 IMPLANT
KIT BASIN OR (CUSTOM PROCEDURE TRAY) ×2 IMPLANT
KIT ROOM TURNOVER OR (KITS) ×2 IMPLANT
NS IRRIG 1000ML POUR BTL (IV SOLUTION) ×2 IMPLANT
PAD ARMBOARD 7.5X6 YLW CONV (MISCELLANEOUS) ×2 IMPLANT
POUCH RETRIEVAL ECOSAC 10 (ENDOMECHANICALS) ×1 IMPLANT
POUCH RETRIEVAL ECOSAC 10MM (ENDOMECHANICALS) ×1
SCISSORS LAP 5X35 DISP (ENDOMECHANICALS) ×2 IMPLANT
SET CHOLANGIOGRAPH 5 50 .035 (SET/KITS/TRAYS/PACK) ×2 IMPLANT
SET IRRIG TUBING LAPAROSCOPIC (IRRIGATION / IRRIGATOR) ×2 IMPLANT
SLEEVE ENDOPATH XCEL 5M (ENDOMECHANICALS) ×4 IMPLANT
SPECIMEN JAR SMALL (MISCELLANEOUS) ×2 IMPLANT
SUT MNCRL AB 4-0 PS2 18 (SUTURE) ×2 IMPLANT
TOWEL OR 17X24 6PK STRL BLUE (TOWEL DISPOSABLE) ×2 IMPLANT
TOWEL OR 17X26 10 PK STRL BLUE (TOWEL DISPOSABLE) ×2 IMPLANT
TRAY LAPAROSCOPIC (CUSTOM PROCEDURE TRAY) ×2 IMPLANT
TROCAR XCEL BLUNT TIP 100MML (ENDOMECHANICALS) ×2 IMPLANT
TROCAR XCEL NON-BLD 5MMX100MML (ENDOMECHANICALS) ×2 IMPLANT

## 2013-10-16 NOTE — Anesthesia Postprocedure Evaluation (Signed)
  Anesthesia Post-op Note  Patient: Christina Campos  Procedure(s) Performed: Procedure(s): LAPAROSCOPIC CHOLECYSTECTOMY WITH INTRAOPERATIVE CHOLANGIOGRAM (N/A)  Patient Location: PACU  Anesthesia Type:General  Level of Consciousness: awake  Airway and Oxygen Therapy: Patient Spontanous Breathing  Post-op Pain: mild  Post-op Assessment: Post-op Vital signs reviewed  Post-op Vital Signs: stable  Complications: No apparent anesthesia complications

## 2013-10-16 NOTE — Anesthesia Procedure Notes (Signed)
Procedure Name: Intubation Date/Time: 10/16/2013 11:09 AM Performed by: Jerilee Hoh Pre-anesthesia Checklist: Patient identified, Emergency Drugs available, Suction available and Patient being monitored Patient Re-evaluated:Patient Re-evaluated prior to inductionOxygen Delivery Method: Circle system utilized Preoxygenation: Pre-oxygenation with 100% oxygen Intubation Type: IV induction Ventilation: Mask ventilation without difficulty Laryngoscope Size: Mac and 3 Grade View: Grade II Tube type: Oral Tube size: 7.0 mm Number of attempts: 1 Airway Equipment and Method: Stylet Placement Confirmation: ETT inserted through vocal cords under direct vision,  positive ETCO2 and breath sounds checked- equal and bilateral Secured at: 22 cm Tube secured with: Tape Dental Injury: Teeth and Oropharynx as per pre-operative assessment

## 2013-10-16 NOTE — Progress Notes (Signed)
Agree with above, for lap chole

## 2013-10-16 NOTE — Transfer of Care (Signed)
Immediate Anesthesia Transfer of Care Note  Patient: Christina Campos  Procedure(s) Performed: Procedure(s): LAPAROSCOPIC CHOLECYSTECTOMY WITH INTRAOPERATIVE CHOLANGIOGRAM (N/A)  Patient Location: PACU  Anesthesia Type:General  Level of Consciousness: awake, alert , oriented and patient cooperative  Airway & Oxygen Therapy: Patient Spontanous Breathing and Patient connected to nasal cannula oxygen  Post-op Assessment: Report given to PACU RN, Post -op Vital signs reviewed and stable and Patient moving all extremities  Post vital signs: Reviewed and stable  Complications: No apparent anesthesia complications

## 2013-10-16 NOTE — Interval H&P Note (Signed)
History and Physical Interval Note:  10/16/2013 10:27 AM  Christina Campos  has presented today for surgery, with the diagnosis of cholecystitis  The various methods of treatment have been discussed with the patient and family. After consideration of risks, benefits and other options for treatment, the patient has consented to  Procedure(s): LAPAROSCOPIC CHOLECYSTECTOMY WITH INTRAOPERATIVE CHOLANGIOGRAM (N/A) as a surgical intervention .  The patient's history has been reviewed, patient examined, no change in status, stable for surgery.  I have reviewed the patient's chart and labs.  Questions were answered to the patient's satisfaction.     Christina Campos

## 2013-10-16 NOTE — Anesthesia Preprocedure Evaluation (Addendum)
Anesthesia Evaluation  Patient identified by MRN, date of birth, ID band Patient awake    Reviewed: Allergy & Precautions, H&P , NPO status , Patient's Chart, lab work & pertinent test results  History of Anesthesia Complications Negative for: history of anesthetic complications  Airway Mallampati: II  Neck ROM: Full   Comment: Has TMJ Dental  (+) Teeth Intact   Pulmonary asthma , former smoker,  breath sounds clear to auscultation        Cardiovascular hypertension, +CHF + dysrhythmias Rhythm:Regular Rate:Normal     Neuro/Psych    GI/Hepatic   Endo/Other  diabetes  Renal/GU      Musculoskeletal   Abdominal   Peds  Hematology   Anesthesia Other Findings   Reproductive/Obstetrics                          Anesthesia Physical Anesthesia Plan  ASA: II  Anesthesia Plan: General   Post-op Pain Management:    Induction: Intravenous  Airway Management Planned: Oral ETT  Additional Equipment:   Intra-op Plan:   Post-operative Plan: Extubation in OR  Informed Consent: I have reviewed the patients History and Physical, chart, labs and discussed the procedure including the risks, benefits and alternatives for the proposed anesthesia with the patient or authorized representative who has indicated his/her understanding and acceptance.   Dental advisory given  Plan Discussed with: CRNA and Surgeon  Anesthesia Plan Comments:        Anesthesia Quick Evaluation

## 2013-10-16 NOTE — Op Note (Signed)
Preoperative diagnosis: Acute cholecystitis Postoperative diagnosis: same as above Procedure: Laparoscopic cholecystectomy Surgeon: Dr Harden Mo Asst: Ms Magnus Ivan Anesthesia: general EBL: minimal Drains: none Specimen: gb and contents to pathology Sponge and needle count correct times two Disposition to recovery stable  Indications: This is a 68 yof Who presents with right upper quadrant pain, elevated white blood cell count, and an ultrasound consistent with acute cholecystitis. We discussed admission and laparoscopic cholecystectomy.  Procedure: After informed consent was obtained the patient was taken to the operating room. She was given cefoxitin. She had sequential compression devices on her legs. She was then placed under general anesthesia without complication. Her abdomen was prepped and draped in the standard sterile surgical fashion. A surgical timeout was then performed.  I infiltrated Marcaine below the umbilicus. I then made a vertical incision and carried this to the fascia. I incised it sharply and entered the peritoneum bluntly. I then placed a 0 Vicryl purse string suture through the fascia. I then inserted a Hassan trocar and insufflated the abdomen to 15 mm mercury pressure. I then inserted 3 further 5 mm trocars in the epigastrium and right upper quadrant under direct vision without complication. The gallbladder was then retracted cephalad and lateral. I was able to obtain the critical view of safety. I then clipped the artery 3 times and divided this leaving 2 clips in place. I treated the cystic duct in the same fashion. The gallbladder was then removed from the liver bed without difficulty. This was then placed in a bag and removed from the umbilicus. Hemostasis was observed. I then removed the umbilical trocar. I tied my stitch down. I did place an additional 0 Vicryl suture through the fascia. This completely obliterated the defect. I then desufflated the abdomen and  removed the remaining trocars. These were closed with 4 Monocryl and Dermabond. She tolerated this well was extubated and transferred to recovery stable.

## 2013-10-16 NOTE — Progress Notes (Signed)
UR completed 

## 2013-10-16 NOTE — Preoperative (Signed)
Beta Blockers   Reason not to administer Beta Blockers:metoprolol given last eveing

## 2013-10-16 NOTE — Progress Notes (Signed)
Day of Surgery  Subjective: Pt's pain well controlled, less nausea.  Ready to get surgery over with.  NPO.  Objective: Vital signs in last 24 hours: Temp:  [97.5 F (36.4 C)-98.9 F (37.2 C)] 97.6 F (36.4 C) (11/21 0457) Pulse Rate:  [56-108] 78 (11/21 0538) Resp:  [12-18] 18 (11/21 0457) BP: (98-136)/(52-81) 116/57 mmHg (11/21 0538) SpO2:  [93 %-100 %] 95 % (11/21 0457) Weight:  [214 lb 9.6 oz (97.342 kg)-221 lb 9 oz (100.5 kg)] 221 lb 9 oz (100.5 kg) (11/20 1623) Last BM Date: 10/15/13  Intake/Output from previous day: 11/20 0701 - 11/21 0700 In: 915 [P.O.:75; I.V.:840] Out: 825 [Urine:825] Intake/Output this shift:    PE: Gen:  Alert, NAD, pleasant Abd: Soft, mild tenderness, ND, +BS, no HSM   Lab Results:   Recent Labs  10/15/13 1210  WBC 19.3*  HGB 15.2*  HCT 45.5  PLT 330   BMET  Recent Labs  10/15/13 1210 10/16/13 0526  NA 139 141  K 4.0 3.7  CL 100 105  CO2 22 28  GLUCOSE 146* 108*  BUN 18 15  CREATININE 0.91 1.04  CALCIUM 10.3 8.6   PT/INR No results found for this basename: LABPROT, INR,  in the last 72 hours CMP     Component Value Date/Time   NA 141 10/16/2013 0526   K 3.7 10/16/2013 0526   CL 105 10/16/2013 0526   CO2 28 10/16/2013 0526   GLUCOSE 108* 10/16/2013 0526   BUN 15 10/16/2013 0526   CREATININE 1.04 10/16/2013 0526   CALCIUM 8.6 10/16/2013 0526   PROT 6.5 10/16/2013 0526   ALBUMIN 3.3* 10/16/2013 0526   AST 11 10/16/2013 0526   ALT 9 10/16/2013 0526   ALKPHOS 105 10/16/2013 0526   BILITOT 0.3 10/16/2013 0526   GFRNONAA 57* 10/16/2013 0526   GFRAA 66* 10/16/2013 0526   Lipase     Component Value Date/Time   LIPASE 33 10/15/2013 1210       Studies/Results: US Abdomen Complete  10/15/2013   CLINICAL DATA:  History of abdominal pain.  History of hypertension.  EXAM: ULTRASOUND ABDOMEN COMPLETE  COMPARISON:  None.  FINDINGS: Gallbladder  There is cholelithiasis. There is a mobile 2 cm calculus within the  gallbladder. There is thickening of the gallbladder wall. Gallbladder wall thickness measures 6.7 mm. No pericholecystic fluid is evident. No positive sonographic Murphy sign noted.  Common bile duct  Diameter: 2.9 mm. No choledocholithiasis or dilatation of intrahepatic branching bile ducts is evident.  Liver  No focal lesion identified. Within normal limits in parenchymal echogenicity. Portal vein is patent with hepatopetal flow.  IVC  No abnormality visualized.  Pancreas  Visualized portion unremarkable.  Spleen  Size and appearance within normal limits.  Length is 8 cm.  Right Kidney  Length: Right renal length is 11.6 cm. Echogenicity within normal limits. No mass or hydronephrosis visualized.  Left Kidney  Length: Left renal length is 11.6 cm. Echogenicity within normal limits. No mass or hydronephrosis visualized.  Abdominal aorta  No aneurysm visualized.  Maximum diameter is 2.2 cm.  IMPRESSION: There is cholelithiasis. There is a mobile 2 cm calculus within the gallbladder. There is thickening of the gallbladder wall. Gallbladder wall thickness measures 6.7 mm. No pericholecystic fluid is evident. No positive sonographic Murphy sign noted.  Bile ducts appear normal.  No other abnormalities identified.   Electronically Signed   By: Onalee Hua  Call M.D.   On: 10/15/2013 14:00   Dg Chest [facility]  1 View  10/15/2013   CLINICAL DATA:  Preop gallbladder surgery, history of hypertension, CHF, diabetes, asthma and a former smoker  EXAM: PORTABLE CHEST - 1 VIEW  COMPARISON:  Prior chest x-ray 08/31/2013  FINDINGS: Slightly low inspiratory volumes. Otherwise, stable chest x-ray. The lungs are clear. The cardiac and mediastinal contours are within normal limits. No pleural effusion, focal airspace consolidation, pneumothorax or pulmonary edema. No acute osseous abnormality.  IMPRESSION: No active disease.   Electronically Signed   By: Malachy Moan M.D.   On: 10/15/2013 16:38    Anti-infectives: Anti-infectives    Start     Dose/Rate Route Frequency Ordered Stop   10/15/13 1800  cefOXitin (MEFOXIN) 1 g in dextrose 5 % 50 mL IVPB     1 g 100 mL/hr over 30 Minutes Intravenous 4 times per day 10/15/13 1515     10/15/13 1445  piperacillin-tazobactam (ZOSYN) IVPB 3.375 g     3.375 g 12.5 mL/hr over 240 Minutes Intravenous  Once 10/15/13 1437 10/15/13 1906       Assessment/Plan Acute cholecystitis 1.  OR today for lap chole 2.  Hopefully home tomorrow if all goes well 3.  NPO, abx, IVF, pain control 4.  Dr. Dwain Sarna to see prior to surgery 5.  Hold lovenox  HTN DM Type 2 PVC's HLD    LOS: 1 day    DORT, Senetra Dillin 10/16/2013, 8:26 AM Pager: [phone]

## 2013-10-17 LAB — GLUCOSE, CAPILLARY
Glucose-Capillary: 94 mg/dL (ref 70–99)
Glucose-Capillary: 98 mg/dL (ref 70–99)
Glucose-Capillary: 101 mg/dL — ABNORMAL HIGH (ref 70–99)

## 2013-10-17 MED ORDER — CIPROFLOXACIN HCL 500 MG PO TABS
500.0000 mg | ORAL_TABLET | Freq: Two times a day (BID) | ORAL | Status: DC
  Administered 2013-10-17 – 2013-10-19 (×5): 500 mg via ORAL
  Filled 2013-10-17 (×9): qty 1

## 2013-10-17 NOTE — Progress Notes (Addendum)
1 Day Post-Op lap chole Subjective: Pt feels better but still having significant RUQ pain.  Taking IV and PO narcotics.  No nausea.  Tolerating liquids.  Objective: Vital signs in last 24 hours: Temp:  [97.6 F (36.4 C)-98.8 F (37.1 C)] 98.7 F (37.1 C) (11/22 0550) Pulse Rate:  [58-77] 69 (11/22 0550) Resp:  [7-20] 20 (11/22 0550) BP: (85-139)/(29-74) 101/44 mmHg (11/22 0550) SpO2:  [90 %-100 %] 98 % (11/22 0550)   Intake/Output from previous day: 11/21 0701 - 11/22 0700 In: 2563.8 [I.V.:2563.8] Out: 700 [Urine:700] Intake/Output this shift:     General appearance: alert and cooperative GI: appropriately tender in RUQ to palpation, abd soft  Incision: no significant drainage  Lab Results:   Recent Labs  10/15/13 1210  WBC 19.3*  HGB 15.2*  HCT 45.5  PLT 330   BMET  Recent Labs  10/15/13 1210 10/16/13 0526  NA 139 141  K 4.0 3.7  CL 100 105  CO2 22 28  GLUCOSE 146* 108*  BUN 18 15  CREATININE 0.91 1.04  CALCIUM 10.3 8.6   PT/INR No results found for this basename: LABPROT, INR,  in the last 72 hours ABG No results found for this basename: PHART, PCO2, PO2, HCO3,  in the last 72 hours  MEDS, Scheduled . atorvastatin  80 mg Oral q1800  . cefOXitin  1 g Intravenous Q6H  . irbesartan  150 mg Oral Daily   And  . hydrochlorothiazide  12.5 mg Oral Daily  . insulin aspart  0-15 Units Subcutaneous TID WC  . metoprolol tartrate  25 mg Oral BID    Studies/Results: US Abdomen Complete  10/15/2013   CLINICAL DATA:  History of abdominal pain.  History of hypertension.  EXAM: ULTRASOUND ABDOMEN COMPLETE  COMPARISON:  None.  FINDINGS: Gallbladder  There is cholelithiasis. There is a mobile 2 cm calculus within the gallbladder. There is thickening of the gallbladder wall. Gallbladder wall thickness measures 6.7 mm. No pericholecystic fluid is evident. No positive sonographic Murphy sign noted.  Common bile duct  Diameter: 2.9 mm. No choledocholithiasis or  dilatation of intrahepatic branching bile ducts is evident.  Liver  No focal lesion identified. Within normal limits in parenchymal echogenicity. Portal vein is patent with hepatopetal flow.  IVC  No abnormality visualized.  Pancreas  Visualized portion unremarkable.  Spleen  Size and appearance within normal limits.  Length is 8 cm.  Right Kidney  Length: Right renal length is 11.6 cm. Echogenicity within normal limits. No mass or hydronephrosis visualized.  Left Kidney  Length: Left renal length is 11.6 cm. Echogenicity within normal limits. No mass or hydronephrosis visualized.  Abdominal aorta  No aneurysm visualized.  Maximum diameter is 2.2 cm.  IMPRESSION: There is cholelithiasis. There is a mobile 2 cm calculus within the gallbladder. There is thickening of the gallbladder wall. Gallbladder wall thickness measures 6.7 mm. No pericholecystic fluid is evident. No positive sonographic Murphy sign noted.  Bile ducts appear normal.  No other abnormalities identified.   Electronically Signed   By: Onalee Hua  Call M.D.   On: 10/15/2013 14:00   Dg Chest Port 1 View  10/15/2013   CLINICAL DATA:  Preop gallbladder surgery, history of hypertension, CHF, diabetes, asthma and a former smoker  EXAM: PORTABLE CHEST - 1 VIEW  COMPARISON:  Prior chest x-ray 08/31/2013  FINDINGS: Slightly low inspiratory volumes. Otherwise, stable chest x-ray. The lungs are clear. The cardiac and mediastinal contours are within normal limits. No pleural effusion, focal  airspace consolidation, pneumothorax or pulmonary edema. No acute osseous abnormality.  IMPRESSION: No active disease.   Electronically Signed   By: Malachy Moan M.D.   On: 10/15/2013 16:38    Assessment: s/p Procedure(s): LAPAROSCOPIC CHOLECYSTECTOMY WITH INTRAOPERATIVE CHOLANGIOGRAM Patient Active Problem List   Diagnosis Date Noted  . PVC (premature ventricular contraction) 09/02/2013  . Chest tightness 09/02/2013  . HTN (hypertension) 09/02/2013  .  Hypercholesterolemia 09/02/2013    Plan: Advance diet as tolerated KVO IVF's Ambulate Anticipate d/c in AM Will switch Cefoxitin to PO cipro for a combined 7day course   LOS: 2 days     .Vanita Panda, MD [facility], [place] [phone]   10/17/2013 7:56 AM

## 2013-10-18 LAB — GLUCOSE, CAPILLARY
Glucose-Capillary: 105 mg/dL — ABNORMAL HIGH (ref 70–99)
Glucose-Capillary: 121 mg/dL — ABNORMAL HIGH (ref 70–99)
Glucose-Capillary: 109 mg/dL — ABNORMAL HIGH (ref 70–99)

## 2013-10-18 LAB — CBC
HCT: 33.1 % — ABNORMAL LOW (ref 36.0–46.0)
Hemoglobin: 10.6 g/dL — ABNORMAL LOW (ref 12.0–15.0)
MCHC: 32 g/dL (ref 30.0–36.0)
MCV: 93.5 fL (ref 78.0–100.0)
RDW: 13.2 % (ref 11.5–15.5)

## 2013-10-18 MED ORDER — OXYCODONE-ACETAMINOPHEN 5-325 MG PO TABS: 1.0000 | ORAL_TABLET | ORAL | Status: DC | PRN

## 2013-10-18 MED ORDER — CIPROFLOXACIN HCL 500 MG PO TABS: 500.0000 mg | ORAL_TABLET | Freq: Two times a day (BID) | ORAL | Status: DC

## 2013-10-18 NOTE — Progress Notes (Signed)
Pt still feeling severely nauseated, even after IV Zofran given.  Pt states that the nausea is almost as bad as it was when she came to the hospital.  Notified Dr Andrey Campanile (on-call) and order received to cancel the discharge for today.  Will continue to monitor.

## 2013-10-18 NOTE — Progress Notes (Signed)
Pt refusing Atorvastatin. States it makes her "confused".

## 2013-10-18 NOTE — Discharge Summary (Signed)
Physician Discharge Summary  Patient ID: Christina Campos MRN: [id number] DOB/AGE: [birth year] 62 y.o.  Admit date: 10/15/2013 Discharge date: 10/18/2013  Admission Diagnoses: acute cholecystitis  Discharge Diagnoses: same Active Problems:   * No active hospital problems. *   Discharged Condition: good  Hospital Course: The patient underwent a lap chole.  On POD 1 her diet was advanced and her pain medications and antibiotics were switched to PO.  By POD 2 she was tolerating a diet and PO meds.   Consults: None  Significant Diagnostic Studies: labs: CBC  Treatments: IV hydration, antibiotics: Cipro and surgery: lap chole  Discharge Exam: Blood pressure 91/51, pulse 67, temperature 98.2 F (36.8 C), temperature source Oral, resp. rate 17, height 5\' 7"  (1.702 m), weight 221 lb 9 oz (100.5 kg), SpO2 98.00%. General appearance: alert and cooperative GI: normal findings: soft, approriately tender Incision/Wound: clean, dry, intact  Disposition: 01-Home or Self Care   Future Appointments Provider Department Dept Phone   10/20/2013 9:00 AM Rosalio Macadamia, NP [facility] [place] Office [phone]       Medication List         aspirin EC 81 MG tablet  Take 1 tablet (81 mg total) by mouth daily.     ciprofloxacin 500 MG tablet  Commonly known as:  CIPRO  Take 1 tablet (500 mg total) by mouth 2 (two) times daily.     CoQ10 200 MG Caps  Take by mouth every morning.     estrogens (conjugated) 1.25 MG tablet  Commonly known as:  PREMARIN  Take 1.25 mg by mouth daily.     ibuprofen 200 MG tablet  Commonly known as:  ADVIL,MOTRIN  Take 200 mg by mouth every 6 (six) hours as needed for pain.     metoprolol tartrate 25 MG tablet  Commonly known as:  LOPRESSOR  Take 1 tablet (25 mg total) by mouth 2 (two) times daily.     oxyCODONE-acetaminophen 5-325 MG per tablet  Commonly known as:  PERCOCET/ROXICET  Take 1-2 tablets by mouth every 4 (four) hours as needed for  moderate pain.     potassium chloride SA 20 MEQ tablet  Commonly known as:  K-DUR,KLOR-CON  Take 1 tablet (20 mEq total) by mouth daily.     rosuvastatin 10 MG tablet  Commonly known as:  CRESTOR  Take 10 mg by mouth daily.     valsartan-hydrochlorothiazide 160-12.5 MG per tablet  Commonly known as:  DIOVAN-HCT  Take 1 tablet by mouth daily.           Follow-up Information   Follow up with [facility], MD. Schedule an appointment as soon as possible for a visit in 2 weeks.   Specialty:  General Surgery   Contact information:   [address] [place] [address] [phone]       Signed: Vanita Panda 10/18/2013, 7:57 AM

## 2013-10-18 NOTE — Progress Notes (Signed)
Pt complaining of nausea.  Wanting to ask MD for some nausea med at discharge.  Dr Maisie Fus notified and stated that she wanted to find out the cause of the nausea first and possibly re-assess discharge.  Will continue to monitor

## 2013-10-19 LAB — GLUCOSE, CAPILLARY: Glucose-Capillary: 116 mg/dL — ABNORMAL HIGH (ref 70–99)

## 2013-10-19 MED ORDER — TRAMADOL HCL 50 MG PO TABS: 25.0000 mg | ORAL_TABLET | Freq: Four times a day (QID) | ORAL | Status: DC | PRN

## 2013-10-19 MED ORDER — TRAMADOL HCL 50 MG PO TABS
25.0000 mg | ORAL_TABLET | Freq: Four times a day (QID) | ORAL | Status: DC | PRN
  Administered 2013-10-19: 50 mg via ORAL
  Filled 2013-10-19: qty 1

## 2013-10-19 NOTE — Discharge Summary (Signed)
Physician Discharge Summary  Patient ID: Christina Campos MRN: [id number] DOB/AGE: 07/27/1951 62 y.o.  Admit date: 10/15/2013 Discharge date: 10/19/2013  Admitting Diagnosis: Acute cholecystitis  Discharge Diagnosis Patient Active Problem List   Diagnosis Date Noted  . Cholecystitis, acute 10/15/2013  . PVC (premature ventricular contraction) 09/02/2013  . Chest tightness 09/02/2013  . HTN (hypertension) 09/02/2013  . Hypercholesterolemia 09/02/2013    Consultants None  Imaging: No results found.  Procedures Dr. Dwain Sarna (10/16/13) - Laparoscopic Cholecystectomy with [facility]  Hospital Course:  62 year old female with a history of CHF, HTN, HLD, anxiety, diabetes mellitus(diet controlled), PVCs who presented to [facility] on 10/15/13 with abdominal pain. Duration of symptoms is 1 day (10/14/13). Onset was sudden this morning upon awakening. Location is generalized. Moderate to severe in severity. Time pattern is intermittent. Characterized as sharp stabbing pain. Associated with pain between shoulder, nausea, vomiting, diarrhea, fever, chills and sweats. Modifying factors include; clonazepam, pepto-bismol, zofran and ginger ale without any relief. Exacerbating factors include; eating and movement. Alleviating factors; dilaudid given in the ED. Past surgeries include abdominal hysterectomy. Denies history of UC, Crohn's diease or cancer. She takes an aspirin per cardiology, no other anticoagulation. She underwent a cardiac catheterization by Dr. Swaziland which showed normal coronary anatomy and normal LV function. She appeared comfortable at present time. Continues to require pain medication. She had a white count of 19K. A abdominal US Shows gallstones, GB wall thickening. She had normal LFTs.   Patient was admitted and underwent procedure listed above.  Tolerated procedure well and was transferred to the floor.  Diet was advanced as tolerated.  On POD #1 she experienced some post-op nausea, which  was subsequently related to the percocet.  Since discontinuing that and starting ultram this has been resolved.  On POD #2, the patient was voiding well, tolerating diet, ambulating well, pain well controlled, vital signs stable, incisions c/d/i and felt stable for discharge home.  Patient will follow up in our office in 3 weeks and knows to call with questions or concerns.  Physical Exam: Filed Vitals:   10/19/13 0511  BP: 103/44  Pulse: 73  Temp: 98.4 F (36.9 C)  Resp: 16   General:  Alert, NAD, pleasant, comfortable Abd:  Soft, ND, mild tenderness, incisions C/D/I    Medication List         aspirin EC 81 MG tablet  Take 1 tablet (81 mg total) by mouth daily.     ciprofloxacin 500 MG tablet  Commonly known as:  CIPRO  Take 1 tablet (500 mg total) by mouth 2 (two) times daily.     CoQ10 200 MG Caps  Take by mouth every morning.     estrogens (conjugated) 1.25 MG tablet  Commonly known as:  PREMARIN  Take 1.25 mg by mouth daily.     ibuprofen 200 MG tablet  Commonly known as:  ADVIL,MOTRIN  Take 200 mg by mouth every 6 (six) hours as needed for pain.     metoprolol tartrate 25 MG tablet  Commonly known as:  LOPRESSOR  Take 1 tablet (25 mg total) by mouth 2 (two) times daily.     potassium chloride SA 20 MEQ tablet  Commonly known as:  K-DUR,KLOR-CON  Take 1 tablet (20 mEq total) by mouth daily.     rosuvastatin 10 MG tablet  Commonly known as:  CRESTOR  Take 10 mg by mouth daily.     traMADol 50 MG tablet  Commonly known as:  Janean Sark  Take 0.5-2 tablets (25-100 mg total) by mouth every 6 (six) hours as needed for moderate pain.     valsartan-hydrochlorothiazide 160-12.5 MG per tablet  Commonly known as:  DIOVAN-HCT  Take 1 tablet by mouth daily.         Follow-up Information   Follow up with [facility], MD. Schedule an appointment as soon as possible for a visit in 2 weeks.   Specialty:  General Surgery   Contact information:   [address] [place] [address] [phone]       Signed: Candiss Norse [facility] [phone]  10/19/2013, 10:31 AM

## 2013-10-19 NOTE — Discharge Planning (Signed)
Patient discharged home in stable condition. Verbalizes understanding of all discharge instructions, including home medications and follow up appointments. 

## 2013-10-19 NOTE — Discharge Summary (Signed)
Okay to  Go home [employer]. Gae Bon, MD, FACS [phone] [facility]

## 2013-10-20 ENCOUNTER — Encounter (INDEPENDENT_AMBULATORY_CARE_PROVIDER_SITE_OTHER): Payer: Self-pay

## 2013-10-20 ENCOUNTER — Ambulatory Visit (INDEPENDENT_AMBULATORY_CARE_PROVIDER_SITE_OTHER): Payer: 59 | Admitting: Nurse Practitioner

## 2013-10-20 ENCOUNTER — Encounter: Payer: Self-pay | Admitting: Nurse Practitioner

## 2013-10-20 VITALS — BP 110/80 | HR 80 | Ht 67.0 in | Wt 215.8 lb

## 2013-10-20 DIAGNOSIS — Z9889 Other specified postprocedural states: Secondary | ICD-10-CM

## 2013-10-20 DIAGNOSIS — G4733 Obstructive sleep apnea (adult) (pediatric): Secondary | ICD-10-CM

## 2013-10-20 DIAGNOSIS — R0683 Snoring: Secondary | ICD-10-CM

## 2013-10-20 DIAGNOSIS — I4949 Other premature depolarization: Secondary | ICD-10-CM

## 2013-10-20 DIAGNOSIS — R0609 Other forms of dyspnea: Secondary | ICD-10-CM

## 2013-10-20 DIAGNOSIS — I493 Ventricular premature depolarization: Secondary | ICD-10-CM

## 2013-10-20 NOTE — Patient Instructions (Addendum)
Stay on your current medicines  See Dr. Swaziland in 6 months  We will arrange for a sleep study with Dr. Armanda Magic for after Christmas  Call the [facility] office at [phone] if you have any questions, problems or concerns.

## 2013-10-20 NOTE — Progress Notes (Signed)
Ave Filter Date of Birth: [birth year] Medical Record #[id number]  History of Present Illness: [name] Campos seen back today for a post cath visit. Seen for Dr. Swaziland. She has had PVCs and an abnormal stress echo which led to cardiac cath earlier this month - this showed normal coronary arteries and normal LV function. Her other issues include HTN and HLD.   Since her cath - she presented back to the hospital with abdominal pain - ended up getting her gallbladder taken out. Discharged yesterday.   Comes back today. Here with her husband. She Campos doing ok - little sore from the gas that was used for the lap chole. No more chest pain. Palpitations have improved with the beta blocker. Wondering if her dyspnea Campos related to OSA - apparently snores, sats dropped with her recent admission, admits to not feeling rested when she wakes up. Her cath report was reviewed with her.   Current Outpatient Prescriptions  Medication Sig Dispense Refill  . acetaminophen (TYLENOL) 325 MG tablet Take 650 mg by mouth every 6 (six) hours as needed.      . ciprofloxacin (CIPRO) 500 MG tablet Take 1 tablet (500 mg total) by mouth 2 (two) times daily.  12 tablet  0  . Coenzyme Q10 (COQ10) 200 MG CAPS Take by mouth every morning.      Marland Kitchen ibuprofen (ADVIL,MOTRIN) 200 MG tablet Take 200 mg by mouth every 6 (six) hours as needed for pain.      . metoprolol tartrate (LOPRESSOR) 25 MG tablet Take 1 tablet (25 mg total) by mouth 2 (two) times daily.  180 tablet  3  . traMADol (ULTRAM) 50 MG tablet Take 0.5-2 tablets (25-100 mg total) by mouth every 6 (six) hours as needed for moderate pain.  40 tablet  0  . valsartan-hydrochlorothiazide (DIOVAN-HCT) 160-12.5 MG per tablet Take 1 tablet by mouth daily.      Marland Kitchen aspirin EC 81 MG tablet Take 1 tablet (81 mg total) by mouth daily.  90 tablet  3  . estrogens, conjugated, (PREMARIN) 1.25 MG tablet Take 1.25 mg by mouth daily.      . potassium chloride SA (K-DUR,KLOR-CON) 20 MEQ  tablet Take 1 tablet (20 mEq total) by mouth daily.  90 tablet  3  . rosuvastatin (CRESTOR) 10 MG tablet Take 10 mg by mouth daily.       Current Facility-Administered Medications  Medication Dose Route Frequency Provider Last Rate Last Dose  . potassium chloride (K-DUR,KLOR-CON) CR tablet 20 mEq  20 mEq Oral Once Peter M Swaziland, MD        Allergies  Allergen Reactions  . Codeine Shortness Of Breath and Swelling    unknown  . Latex     itching    Past Medical History  Diagnosis Date  . Hypertension   . Hyperlipidemia   . Anxiety   . Diverticulitis   . Diabetes mellitus without complication     gestational  . Rotator cuff tear   . Asthma   . PVC (premature ventricular contraction)   . Abnormal stress echocardiogram     with normal cath - normal coronaries and normal LV function    Past Surgical History  Procedure Laterality Date  . Abdominal hysterectomy    . Tmj repair    . Cardiac catheterization  November 2014    normal coronaries - normal LV function  . Laparoscopic cholecystectomy  November 2014    History  Smoking status  . Former  Smoker  Smokeless tobacco  . Not on file    History  Alcohol Use  . 0.6 oz/week  . 1 Glasses of wine per week    Comment: most days 1 glass of wine    Family History  Problem Relation Age of Onset  . Arrhythmia Brother   . Arrhythmia Brother   . Heart disease Father   . Hypertension Father   . Heart attack Father   . Diabetes Mother   . Diabetes Maternal Grandfather   . Diabetes Paternal Grandmother     Review of Systems: The review of systems Campos per the HPI.  All other systems were reviewed and are negative.  Physical Exam: BP 110/80  Pulse 80  Ht 5\' 7"  (1.702 m)  Wt 215 lb 12.8 oz (97.886 kg)  BMI 33.79 kg/m2  SpO2 94% Patient Campos very pleasant and in no acute distress. She Campos obese. Skin Campos warm and dry. Color Campos normal.  HEENT Campos unremarkable. Normocephalic/atraumatic. PERRL. Sclera are nonicteric. Neck Campos  supple. No masses. No JVD. Lungs are clear. Cardiac exam shows a regular rate and rhythm. Abdomen Campos soft. Extremities are without edema. Gait and ROM are intact. No gross neurologic deficits noted.  LABORATORY DATA:  Lab Results  Component Value Date   WBC 9.8 10/18/2013   HGB 10.6* 10/18/2013   HCT 33.1* 10/18/2013   PLT 220 10/18/2013   GLUCOSE 108* 10/16/2013   ALT 9 10/16/2013   AST 11 10/16/2013   NA 141 10/16/2013   K 3.7 10/16/2013   CL 105 10/16/2013   CREATININE 1.04 10/16/2013   BUN 15 10/16/2013   CO2 28 10/16/2013   TSH 1.163 08/31/2013   INR 0.86 10/07/2013   Coronary angiography:  Coronary dominance: right  Left mainstem: Normal  Left anterior descending (LAD): Normal  Left circumflex (LCx): Normal  Right coronary artery (RCA): Normal  Left ventriculography: Left ventricular systolic function Campos normal, LVEF Campos estimated at 55-65%, there Campos no significant mitral regurgitation  Final Conclusions:  1. Normal coronary anatomy  2. Normal LV function  Recommendations: Will treat her ventricular ectopy with low dose beta blocker and increase potassium dose.  Theron Arista [facility]  10/07/2013, 9:23 AM    Assessment / Plan: 1. S/P cardiac cath - normal coronaries and normal LV function - would recommend CV risk factor modification. Would suspect that her chest pain was probably from her gallbladder.  2. S/P lap chole - stable progress.  3. PVC's - on low dose beta blocker - improved.   4. HTN - BP ok - no change in medicines  5. OSA? - will arrange for sleep study with Dr. Mayford Knife.  We will see her back in 6 months, otherwise defer her care to PCP.   Patient Campos agreeable to this plan and will call if any problems develop in the interim.   Rosalio Macadamia, RN, ANP-C [facility] [address] [place], [place]  [address]

## 2013-11-10 ENCOUNTER — Encounter (INDEPENDENT_AMBULATORY_CARE_PROVIDER_SITE_OTHER): Payer: 59

## 2013-12-04 ENCOUNTER — Ambulatory Visit (INDEPENDENT_AMBULATORY_CARE_PROVIDER_SITE_OTHER): Payer: 59 | Admitting: General Surgery

## 2013-12-04 ENCOUNTER — Encounter (INDEPENDENT_AMBULATORY_CARE_PROVIDER_SITE_OTHER): Payer: Self-pay | Admitting: General Surgery

## 2013-12-04 ENCOUNTER — Encounter (INDEPENDENT_AMBULATORY_CARE_PROVIDER_SITE_OTHER): Payer: Self-pay

## 2013-12-04 VITALS — BP 122/82 | HR 72 | Temp 98.6°F | Resp 14 | Ht 67.0 in | Wt 207.2 lb

## 2013-12-04 DIAGNOSIS — Z09 Encounter for follow-up examination after completed treatment for conditions other than malignant neoplasm: Secondary | ICD-10-CM

## 2013-12-04 NOTE — Progress Notes (Signed)
Subjective:     Patient ID: Christina Campos, female   DOB: [birth year], 63 y.o.   MRN: [id number]  HPI 63 year old female who presented to the hospital with cholecystitis. She underwent a laparoscopic cholecystectomy and returns today doing well. She has no real complaints except for some occasional abdominal pain. She is eating well.  Review of Systems     Objective:   Physical Exam Healed abdominal incisions without infection, abdomen soft    Assessment:     Status post Laparoscopic cholecystectomy     Plan:     She is doing well. She was released to full activity. She will see me as needed. Her pathology showed cholecystitis with a single stone.

## 2014-09-01 ENCOUNTER — Encounter (HOSPITAL_COMMUNITY): Payer: Self-pay | Admitting: Emergency Medicine

## 2014-09-01 ENCOUNTER — Emergency Department (HOSPITAL_COMMUNITY): Payer: 59

## 2014-09-01 ENCOUNTER — Inpatient Hospital Stay (HOSPITAL_COMMUNITY)
Admission: EM | Admit: 2014-09-01 | Discharge: 2014-09-07 | DRG: 390 | Disposition: A | Discharge status: Home / Self Care | Payer: 59 | Attending: Internal Medicine | Admitting: Internal Medicine

## 2014-09-01 DIAGNOSIS — I10 Essential (primary) hypertension: Secondary | ICD-10-CM | POA: Diagnosis present

## 2014-09-01 DIAGNOSIS — Z91040 Latex allergy status: Secondary | ICD-10-CM | POA: Diagnosis not present

## 2014-09-01 DIAGNOSIS — E785 Hyperlipidemia, unspecified: Secondary | ICD-10-CM | POA: Diagnosis present

## 2014-09-01 DIAGNOSIS — Z885 Allergy status to narcotic agent status: Secondary | ICD-10-CM | POA: Diagnosis not present

## 2014-09-01 DIAGNOSIS — R0789 Other chest pain: Secondary | ICD-10-CM

## 2014-09-01 DIAGNOSIS — F419 Anxiety disorder, unspecified: Secondary | ICD-10-CM | POA: Diagnosis present

## 2014-09-01 DIAGNOSIS — R109 Unspecified abdominal pain: Secondary | ICD-10-CM | POA: Diagnosis present

## 2014-09-01 DIAGNOSIS — Z87891 Personal history of nicotine dependence: Secondary | ICD-10-CM

## 2014-09-01 DIAGNOSIS — I493 Ventricular premature depolarization: Secondary | ICD-10-CM

## 2014-09-01 DIAGNOSIS — E1165 Type 2 diabetes mellitus with hyperglycemia: Secondary | ICD-10-CM | POA: Diagnosis present

## 2014-09-01 DIAGNOSIS — Z833 Family history of diabetes mellitus: Secondary | ICD-10-CM

## 2014-09-01 DIAGNOSIS — Z8249 Family history of ischemic heart disease and other diseases of the circulatory system: Secondary | ICD-10-CM

## 2014-09-01 DIAGNOSIS — E7800 Pure hypercholesterolemia, unspecified: Secondary | ICD-10-CM

## 2014-09-01 DIAGNOSIS — K5730 Diverticulosis of large intestine without perforation or abscess without bleeding: Secondary | ICD-10-CM | POA: Diagnosis present

## 2014-09-01 DIAGNOSIS — K565 Intestinal adhesions [bands] with obstruction (postprocedural) (postinfection): Principal | ICD-10-CM | POA: Diagnosis present

## 2014-09-01 DIAGNOSIS — K56609 Unspecified intestinal obstruction, unspecified as to partial versus complete obstruction: Secondary | ICD-10-CM | POA: Diagnosis present

## 2014-09-01 DIAGNOSIS — D72829 Elevated white blood cell count, unspecified: Secondary | ICD-10-CM | POA: Diagnosis present

## 2014-09-01 DIAGNOSIS — J4520 Mild intermittent asthma, uncomplicated: Secondary | ICD-10-CM

## 2014-09-01 DIAGNOSIS — J45909 Unspecified asthma, uncomplicated: Secondary | ICD-10-CM | POA: Diagnosis present

## 2014-09-01 DIAGNOSIS — R739 Hyperglycemia, unspecified: Secondary | ICD-10-CM | POA: Diagnosis present

## 2014-09-01 DIAGNOSIS — K5669 Other intestinal obstruction: Secondary | ICD-10-CM

## 2014-09-01 LAB — MAGNESIUM: Magnesium: 2 mg/dL (ref 1.5–2.5)

## 2014-09-01 LAB — CBC
HCT: 37.7 % (ref 36.0–46.0)
Hemoglobin: 12.3 g/dL (ref 12.0–15.0)
MCH: 29.6 pg (ref 26.0–34.0)
MCHC: 32.6 g/dL (ref 30.0–36.0)
MCV: 90.8 fL (ref 78.0–100.0)
Platelets: 247 10*3/uL (ref 150–400)
RBC: 4.15 MIL/uL (ref 3.87–5.11)
RDW: 13.2 % (ref 11.5–15.5)
WBC: 10.5 10*3/uL (ref 4.0–10.5)

## 2014-09-01 LAB — COMPREHENSIVE METABOLIC PANEL
ALK PHOS: 139 U/L — AB (ref 39–117)
ALT: 14 U/L (ref 0–35)
ANION GAP: 15 (ref 5–15)
AST: 16 U/L (ref 0–37)
Albumin: 4.2 g/dL (ref 3.5–5.2)
BUN: 15 mg/dL (ref 6–23)
CALCIUM: 9.9 mg/dL (ref 8.4–10.5)
CO2: 25 meq/L (ref 19–32)
Chloride: 100 mEq/L (ref 96–112)
Creatinine, Ser: 0.95 mg/dL (ref 0.50–1.10)
GFR, EST AFRICAN AMERICAN: 73 mL/min — AB (ref >90)
GFR, EST NON AFRICAN AMERICAN: 63 mL/min — AB (ref >90)
GLUCOSE: 150 mg/dL — AB (ref 70–99)
POTASSIUM: 4.2 meq/L (ref 3.7–5.3)
Sodium: 140 mEq/L (ref 137–147)
Total Bilirubin: 0.3 mg/dL (ref 0.3–1.2)
Total Protein: 8.2 g/dL (ref 6.0–8.3)

## 2014-09-01 LAB — CBC WITH DIFFERENTIAL/PLATELET
Basophils Absolute: 0 10*3/uL (ref 0.0–0.1)
Basophils Relative: 0 % (ref 0–1)
EOS ABS: 0.1 10*3/uL (ref 0.0–0.7)
EOS PCT: 1 % (ref 0–5)
HCT: 42.2 % (ref 36.0–46.0)
Hemoglobin: 14.1 g/dL (ref 12.0–15.0)
LYMPHS ABS: 1.6 10*3/uL (ref 0.7–4.0)
Lymphocytes Relative: 12 % (ref 12–46)
MCH: 29.9 pg (ref 26.0–34.0)
MCHC: 33.4 g/dL (ref 30.0–36.0)
MCV: 89.4 fL (ref 78.0–100.0)
Monocytes Absolute: 0.4 10*3/uL (ref 0.1–1.0)
Monocytes Relative: 3 % (ref 3–12)
Neutro Abs: 11.1 10*3/uL — ABNORMAL HIGH (ref 1.7–7.7)
Neutrophils Relative %: 84 % — ABNORMAL HIGH (ref 43–77)
Platelets: 289 10*3/uL (ref 150–400)
RBC: 4.72 MIL/uL (ref 3.87–5.11)
RDW: 12.8 % (ref 11.5–15.5)
WBC: 13.2 10*3/uL — ABNORMAL HIGH (ref 4.0–10.5)

## 2014-09-01 LAB — CREATININE, SERUM
CREATININE: 0.81 mg/dL (ref 0.50–1.10)
GFR calc Af Amer: 89 mL/min — ABNORMAL LOW (ref >90)
GFR calc non Af Amer: 77 mL/min — ABNORMAL LOW (ref >90)

## 2014-09-01 LAB — URINALYSIS, ROUTINE W REFLEX MICROSCOPIC
BILIRUBIN URINE: NEGATIVE
Glucose, UA: NEGATIVE mg/dL
HGB URINE DIPSTICK: NEGATIVE
KETONES UR: NEGATIVE mg/dL
Leukocytes, UA: NEGATIVE
NITRITE: NEGATIVE
PH: 6.5 (ref 5.0–8.0)
Protein, ur: NEGATIVE mg/dL
SPECIFIC GRAVITY, URINE: 1.015 (ref 1.005–1.030)
Urobilinogen, UA: 0.2 mg/dL (ref 0.0–1.0)

## 2014-09-01 LAB — I-STAT TROPONIN, ED: Troponin i, poc: 0 ng/mL (ref 0.00–0.08)

## 2014-09-01 LAB — I-STAT CG4 LACTIC ACID, ED: Lactic Acid, Venous: 1.43 mmol/L (ref 0.5–2.2)

## 2014-09-01 LAB — PHOSPHORUS: Phosphorus: 3.1 mg/dL (ref 2.3–4.6)

## 2014-09-01 LAB — LIPASE, BLOOD: Lipase: 29 U/L (ref 11–59)

## 2014-09-01 LAB — HEMOGLOBIN A1C
Hgb A1c MFr Bld: 5.9 % — ABNORMAL HIGH (ref <5.7)
Mean Plasma Glucose: 123 mg/dL — ABNORMAL HIGH (ref <117)

## 2014-09-01 MED ORDER — LEVOFLOXACIN IN D5W 750 MG/150ML IV SOLN
750.0000 mg | INTRAVENOUS | Status: DC
  Administered 2014-09-01 – 2014-09-06 (×6): 750 mg via INTRAVENOUS
  Filled 2014-09-01 (×6): qty 150

## 2014-09-01 MED ORDER — HEPARIN SODIUM (PORCINE) 5000 UNIT/ML IJ SOLN
5000.0000 [IU] | Freq: Three times a day (TID) | INTRAMUSCULAR | Status: DC
  Administered 2014-09-01 – 2014-09-05 (×11): 5000 [IU] via SUBCUTANEOUS
  Filled 2014-09-01 (×19): qty 1

## 2014-09-01 MED ORDER — FENTANYL CITRATE 0.05 MG/ML IJ SOLN
50.0000 ug | Freq: Once | INTRAMUSCULAR | Status: AC
  Administered 2014-09-01: 50 ug via INTRAVENOUS

## 2014-09-01 MED ORDER — MORPHINE SULFATE 4 MG/ML IJ SOLN
4.0000 mg | INTRAMUSCULAR | Status: DC | PRN
  Administered 2014-09-01 – 2014-09-06 (×24): 4 mg via INTRAVENOUS
  Filled 2014-09-01 (×24): qty 1

## 2014-09-01 MED ORDER — PROMETHAZINE HCL 25 MG/ML IJ SOLN: 12.5000 mg | Freq: Four times a day (QID) | INTRAMUSCULAR | Status: DC | PRN

## 2014-09-01 MED ORDER — ONDANSETRON HCL 4 MG/2ML IJ SOLN
INTRAMUSCULAR | Status: AC
  Filled 2014-09-01: qty 2

## 2014-09-01 MED ORDER — HYDROMORPHONE HCL 1 MG/ML IJ SOLN
0.5000 mg | Freq: Once | INTRAMUSCULAR | Status: AC
  Administered 2014-09-01: 0.5 mg via INTRAVENOUS
  Filled 2014-09-01: qty 1

## 2014-09-01 MED ORDER — FENTANYL CITRATE 0.05 MG/ML IJ SOLN
INTRAMUSCULAR | Status: AC
  Filled 2014-09-01: qty 2

## 2014-09-01 MED ORDER — ACETAMINOPHEN 650 MG RE SUPP: 650.0000 mg | Freq: Four times a day (QID) | RECTAL | Status: DC | PRN

## 2014-09-01 MED ORDER — ONDANSETRON HCL 4 MG/2ML IJ SOLN
INTRAMUSCULAR | Status: AC
  Administered 2014-09-01: 4 mg via INTRAVENOUS
  Filled 2014-09-01: qty 2

## 2014-09-01 MED ORDER — ONDANSETRON HCL 4 MG/2ML IJ SOLN
4.0000 mg | Freq: Four times a day (QID) | INTRAMUSCULAR | Status: DC | PRN
  Administered 2014-09-01 – 2014-09-04 (×5): 4 mg via INTRAVENOUS
  Filled 2014-09-01 (×5): qty 2

## 2014-09-01 MED ORDER — ONDANSETRON 4 MG PO TBDP
8.0000 mg | ORAL_TABLET | Freq: Once | ORAL | Status: AC
  Administered 2014-09-01: 8 mg via ORAL
  Filled 2014-09-01: qty 2

## 2014-09-01 MED ORDER — IOHEXOL 300 MG/ML  SOLN
25.0000 mL | Freq: Once | INTRAMUSCULAR | Status: AC | PRN
  Administered 2014-09-01: 25 mL via ORAL

## 2014-09-01 MED ORDER — KCL IN DEXTROSE-NACL 20-5-0.45 MEQ/L-%-% IV SOLN
INTRAVENOUS | Status: DC
  Administered 2014-09-01 – 2014-09-02 (×3): via INTRAVENOUS
  Filled 2014-09-01 (×5): qty 1000

## 2014-09-01 MED ORDER — SODIUM CHLORIDE 0.9 % IV SOLN
INTRAVENOUS | Status: DC
  Administered 2014-09-01: 125 mL/h via INTRAVENOUS
  Administered 2014-09-01: 09:00:00 via INTRAVENOUS

## 2014-09-01 MED ORDER — PROMETHAZINE HCL 25 MG/ML IJ SOLN
25.0000 mg | Freq: Once | INTRAMUSCULAR | Status: AC
  Administered 2014-09-01: 25 mg via INTRAVENOUS
  Filled 2014-09-01: qty 1

## 2014-09-01 MED ORDER — ONDANSETRON HCL 4 MG PO TABS: 4.0000 mg | ORAL_TABLET | Freq: Four times a day (QID) | ORAL | Status: DC | PRN

## 2014-09-01 MED ORDER — MORPHINE SULFATE 4 MG/ML IJ SOLN
4.0000 mg | Freq: Once | INTRAMUSCULAR | Status: AC
  Administered 2014-09-01: 4 mg via INTRAVENOUS
  Filled 2014-09-01: qty 1

## 2014-09-01 MED ORDER — METOPROLOL TARTRATE 1 MG/ML IV SOLN
2.5000 mg | Freq: Four times a day (QID) | INTRAVENOUS | Status: DC
  Administered 2014-09-01 – 2014-09-06 (×14): 2.5 mg via INTRAVENOUS
  Filled 2014-09-01 (×27): qty 5

## 2014-09-01 MED ORDER — ACETAMINOPHEN 325 MG PO TABS
650.0000 mg | ORAL_TABLET | Freq: Four times a day (QID) | ORAL | Status: DC | PRN
  Administered 2014-09-07: 650 mg via ORAL
  Filled 2014-09-01: qty 2

## 2014-09-01 MED ORDER — PANTOPRAZOLE SODIUM 40 MG IV SOLR
40.0000 mg | INTRAVENOUS | Status: DC
  Administered 2014-09-01 – 2014-09-06 (×6): 40 mg via INTRAVENOUS
  Filled 2014-09-01 (×6): qty 40

## 2014-09-01 MED ORDER — IOHEXOL 300 MG/ML  SOLN
100.0000 mL | Freq: Once | INTRAMUSCULAR | Status: AC | PRN
  Administered 2014-09-01: 100 mL via INTRAVENOUS

## 2014-09-01 MED ORDER — PANTOPRAZOLE SODIUM 40 MG IV SOLR
40.0000 mg | Freq: Once | INTRAVENOUS | Status: AC
  Administered 2014-09-01: 40 mg via INTRAVENOUS
  Filled 2014-09-01: qty 40

## 2014-09-01 MED ORDER — HYDRALAZINE HCL 20 MG/ML IJ SOLN: 10.0000 mg | Freq: Three times a day (TID) | INTRAMUSCULAR | Status: DC | PRN

## 2014-09-01 MED ORDER — ONDANSETRON HCL 4 MG/2ML IJ SOLN
4.0000 mg | Freq: Once | INTRAMUSCULAR | Status: AC
  Administered 2014-09-01 (×2): 4 mg via INTRAVENOUS

## 2014-09-01 MED ORDER — SODIUM CHLORIDE 0.9 % IV SOLN
INTRAVENOUS | Status: DC
  Administered 2014-09-01: 12:00:00 via INTRAVENOUS

## 2014-09-01 NOTE — ED Notes (Signed)
Pt alert, restless, NAD, interactive, resps e/u, speaking in clear complete sentences, c/o pain and nausea, (denies: vd, constipation, dizziness, bleeding, urinary or vaginal sx), last ate PTA ~ 0000MN, vomited immediately, last BM 2200 (softer, brown), pinpoints pain to xyphoid process area down to suprapubic area. Admits to "always have back pain". Hx & labs reviewed, VSS. Pending EDP eval.

## 2014-09-01 NOTE — ED Provider Notes (Signed)
TIME SEEN: 3:41 PM  CHIEF COMPLAINT: Abdominal pain, nausea, vomiting, chills  HPI: Patient is a 63 year old female with history of hypertension, diabetes, hyperlipidemia who presents emergency department complaints of stabbing epigastric pain that radiates into her suprapubic region, chills, nausea and vomiting that started at 6:30 PM yesterday. Denies chest pain or shortness of breath. No diarrhea. No bloody stools or melena. No fevers or chills. No sick contacts or recent travel, hospitalization or antibiotic use. She states she does use NSAIDs heavily. Does take alcohol occasionally. No history of endoscopy. She is status post cholecystectomy. States she had similar pains with her gallstones.  PCP is Dr. Laurann Montana  ROS: See HPI Constitutional: no fever  Eyes: no drainage  ENT: no runny nose   Cardiovascular:  no chest pain  Resp: no SOB  GI:  vomiting GU: no dysuria Integumentary: no rash  Allergy: no hives  Musculoskeletal: no leg swelling  Neurological: no slurred speech ROS otherwise negative  PAST MEDICAL HISTORY/PAST SURGICAL HISTORY:  Past Medical History  Diagnosis Date  . Hypertension   . Hyperlipidemia   . Anxiety   . Diverticulitis   . Diabetes mellitus without complication     gestational  . Rotator cuff tear   . Asthma   . PVC (premature ventricular contraction)   . Abnormal stress echocardiogram     with normal cath - normal coronaries and normal LV function    MEDICATIONS:  Prior to Admission medications   Medication Sig Start Date End Date Taking? Authorizing Provider  acetaminophen (TYLENOL) 325 MG tablet Take 650 mg by mouth every 6 (six) hours as needed.   Yes Historical Provider, MD  estrogens, conjugated, (PREMARIN) 0.625 MG tablet Take 0.625 mg by mouth daily. Take daily for 21 days then do not take for 7 days.   Yes Historical Provider, MD  ibuprofen (ADVIL,MOTRIN) 200 MG tablet Take 400 mg by mouth every 6 (six) hours as needed for pain.    Yes  Historical Provider, MD  metoprolol tartrate (LOPRESSOR) 25 MG tablet Take 25 mg by mouth daily.   Yes Historical Provider, MD  rosuvastatin (CRESTOR) 10 MG tablet Take 5 mg by mouth daily.    Yes Historical Provider, MD  valsartan-hydrochlorothiazide (DIOVAN-HCT) 160-12.5 MG per tablet Take 1 tablet by mouth daily.   Yes Historical Provider, MD    ALLERGIES:  Allergies  Allergen Reactions  . Codeine Shortness Of Breath and Swelling    unknown  . Latex     itching    SOCIAL HISTORY:  History  Substance Use Topics  . Smoking status: Former Smoker    Quit date: 12/04/1993  . Smokeless tobacco: Never Used  . Alcohol Use: 0.6 oz/week    1 Glasses of wine per week     Comment: most days 1 glass of wine    FAMILY HISTORY: Family History  Problem Relation Age of Onset  . Arrhythmia Brother   . Arrhythmia Brother   . Heart disease Father   . Hypertension Father   . Heart attack Father   . Diabetes Mother   . Diabetes Maternal Grandfather   . Diabetes Paternal Grandmother     EXAM: BP 160/96  Pulse 106  Temp(Src) 97.7 F (36.5 C) (Oral)  Resp 20  Wt 200 lb (90.719 kg)  SpO2 95% CONSTITUTIONAL: Alert and oriented and responds appropriately to questions. Well-hydrated, well-nourished, appears uncomfortable, nontoxic HEAD: Normocephalic EYES: Conjunctivae clear, PERRL ENT: normal nose; no rhinorrhea; moist mucous membranes; pharynx without lesions noted  NECK: Supple, no meningismus, no LAD  CARD: RRR; S1 and S2 appreciated; no murmurs, no clicks, no rubs, no gallops RESP: Normal chest excursion without splinting or tachypnea; breath sounds clear and equal bilaterally; no wheezes, no rhonchi, no rales,  ABD/GI: Normal bowel sounds; non-distended; soft, tender to palpation diffusely with voluntary guarding, no rebound, no peritoneal signs BACK:  The back appears normal and is non-tender to palpation, there is no CVA tenderness EXT: Normal ROM in all joints; non-tender to  palpation; no edema; normal capillary refill; no cyanosis    SKIN: Normal color for age and race; warm NEURO: Moves all extremities equally PSYCH: The patient's mood and manner are appropriate. Grooming and personal hygiene are appropriate.  MEDICAL DECISION MAKING: Concern for possible gastritis, peptic ulcer, pancreatitis, colitis, UTI, appendicitis. We'll obtain labs, urine, acute abdominal series, EKG. We'll give IV fluids, pain and nausea medicine.  ED PROGRESS: Labs show leukocytosis with left shift. Electrolytes and LFTs and lipase normal. Troponin negative. EKG shows no new ischemic changes.   Patient's lactate is normal. Acute abdominal series shows no free air, obstructive bowel gas pattern. CT scan pending.    7:00 AM  Patient has a small bowel obstruction likely from adhesive disease. Will place NG tube. We'll admit to medicine. He should updated with plan.    7:13 AM  Spoke with Dr. Dyann Kief with hospitalist for admission to inpatient, medical bed. We'll also consult surgery given there is a transition point seen on CT scan although likely from adhesive disease.   7:44 AM  Spoke with Dr. Marlou Starks with general surgery who will see the patient in consult.     EKG Interpretation  Date/Time:  Wednesday September 01 2014 01:17:41 EDT Ventricular Rate:  94 PR Interval:  152 QRS Duration: 82 QT Interval:  362 QTC Calculation: 452 R Axis:   -5 Text Interpretation:  Sinus rhythm with occasional Premature ventricular complexes Otherwise normal ECG No significant change since last tracing Confirmed by Georgeann Brinkman,  DO, Johnell Bas ([id number]) on 09/01/2014 3:27:39 AM         Evans, DO 09/01/14 [id number]

## 2014-09-01 NOTE — H&P (Signed)
[facility] History and Physical  Gwynn Chalker AOZ:[id number] DOB: 1951-01-08 DOA: 09/01/2014  Referring physician: Dr. Leonides Schanz PCP: Irven Shelling, MD   Chief Complaint: nausea, vomiting, abd pain  HPI: [name] is a 63 y.o. female with pmh significant for HTN, HLD, asthma, anxiety, hx of cholecystectomy, hysterectomy and diverticulosis; came to ED complaining of nausea, vomiting and abd pain. Patient reports symptoms started approx 12 hours prior to admission and has since then worsen. Patient denies blood in her emesis and denies any new medications or food intake in the last week or so. She reports chills/subjective fever and inability to keep things down (including meds). In ER she was found to have leukocytosis and abnormal CT scan revealing SBO with transition point. Normal LDH and afebrile.    Review of Systems:  Negative excepet as otherwise mentioned on HPI  Past Medical History  Diagnosis Date  . Hypertension   . Hyperlipidemia   . Anxiety   . Diverticulitis   . Diabetes mellitus without complication     gestational  . Rotator cuff tear   . Asthma   . PVC (premature ventricular contraction)   . Abnormal stress echocardiogram     with normal cath - normal coronaries and normal LV function   Past Surgical History  Procedure Laterality Date  . Abdominal hysterectomy    . Tmj repair    . Cardiac catheterization  November 2014    normal coronaries - normal LV function  . Cholecystectomy N/A 10/16/2013    Procedure: LAPAROSCOPIC CHOLECYSTECTOMY WITH INTRAOPERATIVE CHOLANGIOGRAM;  Surgeon: Rolm Bookbinder, MD;  Location: [place];  Service: General;  Laterality: N/A;   Social History:  reports that she quit smoking about 20 years ago. She has never used smokeless tobacco. She reports that she drinks about .6 ounces of alcohol per week. She reports that she does not use illicit drugs.  Allergies  Allergen Reactions  . Codeine Shortness Of Breath and Swelling     unknown  . Latex     itching    Family History  Problem Relation Age of Onset  . Arrhythmia Brother   . Arrhythmia Brother   . Heart disease Father   . Hypertension Father   . Heart attack Father   . Diabetes Mother   . Diabetes Maternal Grandfather   . Diabetes Paternal Grandmother      Prior to Admission medications   Medication Sig Start Date End Date Taking? Authorizing Provider  acetaminophen (TYLENOL) 325 MG tablet Take 650 mg by mouth every 6 (six) hours as needed.   Yes Historical Provider, MD  estrogens, conjugated, (PREMARIN) 0.625 MG tablet Take 0.625 mg by mouth daily. Take daily for 21 days then do not take for 7 days.   Yes Historical Provider, MD  ibuprofen (ADVIL,MOTRIN) 200 MG tablet Take 400 mg by mouth every 6 (six) hours as needed for pain.    Yes Historical Provider, MD  metoprolol tartrate (LOPRESSOR) 25 MG tablet Take 25 mg by mouth daily.   Yes Historical Provider, MD  rosuvastatin (CRESTOR) 10 MG tablet Take 5 mg by mouth daily.    Yes Historical Provider, MD  valsartan-hydrochlorothiazide (DIOVAN-HCT) 160-12.5 MG per tablet Take 1 tablet by mouth daily.   Yes Historical Provider, MD   Physical Exam: Filed Vitals:   09/01/14 1230 09/01/14 1245 09/01/14 1300 09/01/14 1357  BP: 121/58 121/68 111/67 125/64  Pulse: 96 97 97 87  Temp:    98.2 F (36.8 C)  TempSrc:  Oral  Resp:      Weight:      SpO2: 86% 90% 88% 97%    Wt Readings from Last 3 Encounters:  09/01/14 90.719 kg (200 lb)  12/04/13 93.985 kg (207 lb 3.2 oz)  10/20/13 97.886 kg (215 lb 12.8 oz)    General:  Appears calm and complaining of stabbing pain in mid abd; no CP or SOB. Patient is currently afebrile and just feeling mildly nauseous; no further vomiting since admission. Eyes: PERRL, normal lids, irises & conjunctiva; no icterus ENT: grossly normal hearing, mild dryness or MM, no erythema, exudates or thrush; no drainage out of ears or nostrils Neck: no LAD, masses or  thyromegaly, JVD Cardiovascular: sinus tachycardia, no rubs or gallops; S1 and S2 appreciated Respiratory: CTA bilaterally, no w/r/r. Normal respiratory effort. Abdomen: soft, with mid/epigastric abd pain and right mid flank; positive rebound; no guarding, diminish BS Skin: no rash or induration seen on limited exam Musculoskeletal: grossly normal tone BUE/BLE Psychiatric: grossly normal mood and affect, speech fluent and appropriate Neurologic: grossly non-focal.          Labs on Admission:  Basic Metabolic Panel:  Recent Labs Lab 09/01/14 0125  NA 140  K 4.2  CL 100  CO2 25  GLUCOSE 150*  BUN 15  CREATININE 0.95  CALCIUM 9.9   Liver Function Tests:  Recent Labs Lab 09/01/14 0125  AST 16  ALT 14  ALKPHOS 139*  BILITOT 0.3  PROT 8.2  ALBUMIN 4.2    Recent Labs Lab 09/01/14 0125  LIPASE 29   CBC:  Recent Labs Lab 09/01/14 0125  WBC 13.2*  NEUTROABS 11.1*  HGB 14.1  HCT 42.2  MCV 89.4  PLT 289    Radiological Exams on Admission: Ct Abdomen Pelvis W Contrast  09/01/2014   CLINICAL DATA:  Initial evaluation for epigastric and lower abdominal pain  EXAM: CT ABDOMEN AND PELVIS WITH CONTRAST  TECHNIQUE: Multidetector CT imaging of the abdomen and pelvis was performed using the standard protocol following bolus administration of intravenous contrast.  CONTRAST:  143mL OMNIPAQUE IOHEXOL 300 MG/ML  SOLN  COMPARISON:  Prior radiograph from earlier the same day.  FINDINGS: Linear opacities within the bilateral lung bases are most consistent with atelectasis. No pleural or pericardial effusion.  The liver demonstrates a normal contrast enhanced appearance. Gallbladder is surgically absent. No biliary dilatation. Spleen, adrenal glands, and pancreas demonstrate a normal contrast enhanced appearance.  Kidneys are equal in size with symmetric enhancement. Subcentimeter hypodensity within the right kidney is too small the characterize by CT, but statistically likely  represents a small cyst. No nephrolithiasis, hydronephrosis, or focal enhancing renal mass.  Stomach within normal limits.  Proximal small bowel within normal limits. Multiple prominent fluid-filled loops of small bowel are seen clustered within the right lower quadrant, measuring up to 3 cm in diameter. There is an apparent transition point within the lower mid abdomen (series 2, image 69). There is fecalization of the small bowel just proximal to this. The ileum is decompressed distally. Finding is concerning for small bowel obstruction. Free fluid within the pelvis and lower abdomen is likely reactive in nature.  Colon is decompressed and within normal limits. Scattered diverticula present without acute diverticulitis.  Bladder within normal limits.  Uterus and ovaries are not visualized.  No free intraperitoneal air.  No pathologically enlarged intra-abdominal pelvic lymph nodes. Mild calcified atheromatous disease seen within the infrarenal aorta without aneurysm.  No acute osseous abnormality. No worrisome lytic or  blastic osseous lesions. Prominent bilateral facet arthrosis noted at L5-S1 without pars defect.  IMPRESSION: 1. Multiple mildly dilated fluid-filled loops of small bowel clustered within the right lower quadrant with transition point in the lower mid abdomen. Findings most consistent with small bowel obstruction. Underlying adhesive disease is suspected. 2. Small volume free fluid within the lower abdomen and pelvis, likely reactive in nature. 3. Colonic diverticulosis without acute diverticulitis. 4. Status post cholecystectomy and hysterectomy.   Electronically Signed   By: Jeannine Boga M.D.   On: 09/01/2014 06:54   Dg Abd Acute W/chest  09/01/2014   CLINICAL DATA:  Acute onset of epigastric abdominal pain, extending to the umbilicus. Nausea and vomiting. Assess for free intra-abdominal air. Initial encounter.  EXAM: ACUTE ABDOMEN SERIES (ABDOMEN 2 VIEW & CHEST 1 VIEW)  COMPARISON:   Chest radiograph performed 10/15/2013  FINDINGS: The lungs are well-aerated. Mild left basilar atelectasis is noted. There is no evidence of pleural effusion or pneumothorax. The cardiomediastinal silhouette is within normal limits.  The visualized bowel gas pattern is unremarkable. Scattered stool and air are seen within the colon; there is no evidence of small bowel dilatation to suggest obstruction. No free intra-abdominal air is identified on the provided upright view. Clips are noted within the right upper quadrant, reflecting prior cholecystectomy.  No acute osseous abnormalities are seen; the sacroiliac joints are unremarkable in appearance.  IMPRESSION: 1. Unremarkable bowel gas pattern; no free intra-abdominal air seen. Small to moderate amount of stool noted in the brain 2. Mild left basilar atelectasis noted; lungs otherwise clear.   Electronically Signed   By: Garald Balding M.D.   On: 09/01/2014 05:18    EKG: Ventricular Rate: 94  PR Interval: 152  QRS Duration: 82  QT Interval: 362  QTC Calculation: 452  R Axis: -5  Text Interpretation: Sinus rhythm with occasional Premature ventricular complexes Otherwise normal ECG No significant change since last tracing   Assessment/Plan 1-abd pain, nausea, vomiting: due to SBO. Patient with CT scan demonstrating transition point. -will admit to med-surg -NPO, NGT, IVF's, electrolytes repletion and empiric analgesics -surgery consulted, will follow rec's -PRN antiemetics -given chills/subjective fever and elevated WBC's will cover with levaquin (either urine infection vs intra-abdominal source) to cool process off.  2-HTN (hypertension): stable -while NPO will use IV metoprolol and PRN hydralazine  3-Hypercholesterolemia: plan is to continue statins when able to take PO's  4-Asthma, chronic: stable. Has been awhile since she has required even PRN albuterol. -currently no SOB or wheezing -will monitor  5-Leukocytosis: either urine vs  intra-abdominal source.  -Will cover with levaquin and follow WBC's trend -check urine cx  6-Hyperglycemia: with first degree relative with diabetes. -will check A1C   General Surgery (Dr. Elayne Snare III)  Code Status: Full DVT Prophylaxis:heparin  Family Communication: husband at bedside Disposition Plan: inpatient, MED-surg; LOS > 2 midnights   Time spent: 22 minutes  Barton Dubois [facility] Pager [phone]

## 2014-09-01 NOTE — ED Notes (Signed)
Pt went to b/r, could not wait, no sample obtained, requesting a few minutes to try again to give sample.

## 2014-09-01 NOTE — ED Notes (Signed)
To xray, "feel a little better".

## 2014-09-01 NOTE — ED Notes (Signed)
Admitting MD at the bedside.  

## 2014-09-01 NOTE — ED Notes (Signed)
Back from xray, alert, NAD, calm.  ?

## 2014-09-01 NOTE — ED Notes (Signed)
Placed on [place] 2L after phenergan given, remains arousable to voice, interactive, calm, NAD, denies pain, nausea resolving. VSS.

## 2014-09-01 NOTE — Consult Note (Signed)
Reason for Consult:SBO  Referring Physician: Cyril Mourning Ward  Christina Campos is an 63 y.o. female.  HPI: Christina Campos was in her usual state of health when she developed abdominal pain yesterday evening. It gradually got worse and was associated with chills, nausea, and vomiting. She felt like she needed to pass gas but couldn't. When her symptoms didn't abate with time she sought care in the ED. X-rays and CT showed a SBO. Surgery was asked to consult.  Past Medical History  Diagnosis Date  . Hypertension   . Hyperlipidemia   . Anxiety   . Diverticulitis   . Diabetes mellitus without complication     gestational  . Rotator cuff tear   . Asthma   . PVC (premature ventricular contraction)   . Abnormal stress echocardiogram     with normal cath - normal coronaries and normal LV function    Past Surgical History  Procedure Laterality Date  . Abdominal hysterectomy    . Tmj repair    . Cardiac catheterization  November 2014    normal coronaries - normal LV function  . Cholecystectomy N/A 10/16/2013    Procedure: LAPAROSCOPIC CHOLECYSTECTOMY WITH INTRAOPERATIVE CHOLANGIOGRAM;  Surgeon: Rolm Bookbinder, MD;  Location: [facility] OR;  Service: General;  Laterality: N/A;    Family History  Problem Relation Age of Onset  . Arrhythmia Brother   . Arrhythmia Brother   . Heart disease Father   . Hypertension Father   . Heart attack Father   . Diabetes Mother   . Diabetes Maternal Grandfather   . Diabetes Paternal Grandmother     Social History:  reports that she quit smoking about 20 years ago. She has never used smokeless tobacco. She reports that she drinks about .6 ounces of alcohol per week. She reports that she does not use illicit drugs.  Allergies:  Allergies  Allergen Reactions  . Codeine Shortness Of Breath and Swelling    unknown  . Latex     itching    Medications: I have reviewed the patient's current medications.  Results for orders placed during the hospital encounter of  09/01/14 (from the past 48 hour(s))  CBC WITH DIFFERENTIAL     Status: Abnormal   Collection Time    09/01/14  1:25 AM      Result Value Ref Range   WBC 13.2 (*) 4.0 - 10.5 K/uL   RBC 4.72  3.87 - 5.11 MIL/uL   Hemoglobin 14.1  12.0 - 15.0 g/dL   HCT 42.2  36.0 - 46.0 %   MCV 89.4  78.0 - 100.0 fL   MCH 29.9  26.0 - 34.0 pg   MCHC 33.4  30.0 - 36.0 g/dL   RDW 12.8  11.5 - 15.5 %   Platelets 289  150 - 400 K/uL   Neutrophils Relative % 84 (*) 43 - 77 %   Neutro Abs 11.1 (*) 1.7 - 7.7 K/uL   Lymphocytes Relative 12  12 - 46 %   Lymphs Abs 1.6  0.7 - 4.0 K/uL   Monocytes Relative 3  3 - 12 %   Monocytes Absolute 0.4  0.1 - 1.0 K/uL   Eosinophils Relative 1  0 - 5 %   Eosinophils Absolute 0.1  0.0 - 0.7 K/uL   Basophils Relative 0  0 - 1 %   Basophils Absolute 0.0  0.0 - 0.1 K/uL  COMPREHENSIVE METABOLIC PANEL     Status: Abnormal   Collection Time    09/01/14  1:25  AM      Result Value Ref Range   Sodium 140  137 - 147 mEq/L   Potassium 4.2  3.7 - 5.3 mEq/L   Chloride 100  96 - 112 mEq/L   CO2 25  19 - 32 mEq/L   Glucose, Bld 150 (*) 70 - 99 mg/dL   BUN 15  6 - 23 mg/dL   Creatinine, Ser 0.95  0.50 - 1.10 mg/dL   Calcium 9.9  8.4 - 10.5 mg/dL   Total Protein 8.2  6.0 - 8.3 g/dL   Albumin 4.2  3.5 - 5.2 g/dL   AST 16  0 - 37 U/L   ALT 14  0 - 35 U/L   Alkaline Phosphatase 139 (*) 39 - 117 U/L   Total Bilirubin 0.3  0.3 - 1.2 mg/dL   GFR calc non Af Amer 63 (*) >90 mL/min   GFR calc Af Amer 73 (*) >90 mL/min   Comment: (NOTE)     The eGFR has been calculated using the CKD EPI equation.     This calculation has not been validated in all clinical situations.     eGFR's persistently <90 mL/min signify possible Chronic Kidney     Disease.   Anion gap 15  5 - 15  LIPASE, BLOOD     Status: None   Collection Time    09/01/14  1:25 AM      Result Value Ref Range   Lipase 29  11 - 59 U/L  I-STAT TROPOININ, ED     Status: None   Collection Time    09/01/14  1:36 AM       Result Value Ref Range   Troponin i, poc 0.00  0.00 - 0.08 ng/mL   Comment 3            Comment: Due to the release kinetics of cTnI,     a negative result within the first hours     of the onset of symptoms does not rule out     myocardial infarction with certainty.     If myocardial infarction is still suspected,     repeat the test at appropriate intervals.  I-STAT CG4 LACTIC ACID, ED     Status: None   Collection Time    09/01/14  3:54 AM      Result Value Ref Range   Lactic Acid, Venous 1.43  0.5 - 2.2 mmol/L  URINALYSIS, ROUTINE W REFLEX MICROSCOPIC     Status: Abnormal   Collection Time    09/01/14  6:23 AM      Result Value Ref Range   Color, Urine YELLOW  YELLOW   APPearance CLOUDY (*) CLEAR   Specific Gravity, Urine 1.015  1.005 - 1.030   pH 6.5  5.0 - 8.0   Glucose, UA NEGATIVE  NEGATIVE mg/dL   Hgb urine dipstick NEGATIVE  NEGATIVE   Bilirubin Urine NEGATIVE  NEGATIVE   Ketones, ur NEGATIVE  NEGATIVE mg/dL   Protein, ur NEGATIVE  NEGATIVE mg/dL   Urobilinogen, UA 0.2  0.0 - 1.0 mg/dL   Nitrite NEGATIVE  NEGATIVE   Leukocytes, UA NEGATIVE  NEGATIVE   Comment: MICROSCOPIC NOT DONE ON URINES WITH NEGATIVE PROTEIN, BLOOD, LEUKOCYTES, NITRITE, OR GLUCOSE <1000 mg/dL.    Ct Abdomen Pelvis W Contrast  09/01/2014   CLINICAL DATA:  Initial evaluation for epigastric and lower abdominal pain  EXAM: CT ABDOMEN AND PELVIS WITH CONTRAST  TECHNIQUE: Multidetector CT imaging of the abdomen  and pelvis was performed using the standard protocol following bolus administration of intravenous contrast.  CONTRAST:  141m OMNIPAQUE IOHEXOL 300 MG/ML  SOLN  COMPARISON:  Prior radiograph from earlier the same day.  FINDINGS: Linear opacities within the bilateral lung bases are most consistent with atelectasis. No pleural or pericardial effusion.  The liver demonstrates a normal contrast enhanced appearance. Gallbladder is surgically absent. No biliary dilatation. Spleen, adrenal glands, and  pancreas demonstrate a normal contrast enhanced appearance.  Kidneys are equal in size with symmetric enhancement. Subcentimeter hypodensity within the right kidney is too small the characterize by CT, but statistically likely represents a small cyst. No nephrolithiasis, hydronephrosis, or focal enhancing renal mass.  Stomach within normal limits.  Proximal small bowel within normal limits. Multiple prominent fluid-filled loops of small bowel are seen clustered within the right lower quadrant, measuring up to 3 cm in diameter. There is an apparent transition point within the lower mid abdomen (series 2, image 69). There is fecalization of the small bowel just proximal to this. The ileum is decompressed distally. Finding is concerning for small bowel obstruction. Free fluid within the pelvis and lower abdomen is likely reactive in nature.  Colon is decompressed and within normal limits. Scattered diverticula present without acute diverticulitis.  Bladder within normal limits.  Uterus and ovaries are not visualized.  No free intraperitoneal air.  No pathologically enlarged intra-abdominal pelvic lymph nodes. Mild calcified atheromatous disease seen within the infrarenal aorta without aneurysm.  No acute osseous abnormality. No worrisome lytic or blastic osseous lesions. Prominent bilateral facet arthrosis noted at L5-S1 without pars defect.  IMPRESSION: 1. Multiple mildly dilated fluid-filled loops of small bowel clustered within the right lower quadrant with transition point in the lower mid abdomen. Findings most consistent with small bowel obstruction. Underlying adhesive disease is suspected. 2. Small volume free fluid within the lower abdomen and pelvis, likely reactive in nature. 3. Colonic diverticulosis without acute diverticulitis. 4. Status post cholecystectomy and hysterectomy.   Electronically Signed   By: BJeannine BogaM.D.   On: 09/01/2014 06:54   Dg Abd Acute W/chest  09/01/2014   CLINICAL  DATA:  Acute onset of epigastric abdominal pain, extending to the umbilicus. Nausea and vomiting. Assess for free intra-abdominal air. Initial encounter.  EXAM: ACUTE ABDOMEN SERIES (ABDOMEN 2 VIEW & CHEST 1 VIEW)  COMPARISON:  Chest radiograph performed 10/15/2013  FINDINGS: The lungs are well-aerated. Mild left basilar atelectasis is noted. There is no evidence of pleural effusion or pneumothorax. The cardiomediastinal silhouette is within normal limits.  The visualized bowel gas pattern is unremarkable. Scattered stool and air are seen within the colon; there is no evidence of small bowel dilatation to suggest obstruction. No free intra-abdominal air is identified on the provided upright view. Clips are noted within the right upper quadrant, reflecting prior cholecystectomy.  No acute osseous abnormalities are seen; the sacroiliac joints are unremarkable in appearance.  IMPRESSION: 1. Unremarkable bowel gas pattern; no free intra-abdominal air seen. Small to moderate amount of stool noted in the brain 2. Mild left basilar atelectasis noted; lungs otherwise clear.   Electronically Signed   By: JGarald BaldingM.D.   On: 09/01/2014 05:18    Review of Systems  Constitutional: Positive for chills. Negative for fever.  Gastrointestinal: Positive for nausea, vomiting and abdominal pain.  All other systems reviewed and are negative.  Blood pressure 117/58, pulse 96, temperature 97.7 F (36.5 C), temperature source Oral, resp. rate 16, weight 200 lb (90.719 kg),  SpO2 100.00%. Physical Exam  Constitutional: She appears well-developed and well-nourished. No distress.  HENT:  Head: Normocephalic and atraumatic.  Eyes: Conjunctivae are normal. Right eye exhibits no discharge. Left eye exhibits no discharge. No scleral icterus.  Neck: Normal range of motion. Neck supple.  Cardiovascular: Normal rate, regular rhythm and normal heart sounds.  Exam reveals no gallop and no friction rub.   No murmur  heard. Respiratory: Effort normal and breath sounds normal. No respiratory distress. She has no wheezes. She has no rales. She exhibits no tenderness.  GI: Soft. She exhibits no distension. There is generalized tenderness. There is no rigidity, no rebound and no guarding.  Musculoskeletal: She exhibits no edema and no tenderness.  Lymphadenopathy:    She has no cervical adenopathy.  Neurological: She is alert.  Skin: Skin is warm and dry. She is not diaphoretic.  Psychiatric: She has a normal mood and affect. Her behavior is normal.    Assessment/Plan: SBO -- Plan supportive care initially with NGT and bowel rest. Medicine to admit. We will follow along to assess for improvement.    Lisette Abu, PA-C Pager: [phone] 09/01/2014, 12:06 PM

## 2014-09-01 NOTE — ED Notes (Signed)
Pt. reports intermittent epigastric/low abdominal pain with nausea and chills onset this evening , denies emesis or diarrhea , no fever .

## 2014-09-02 ENCOUNTER — Inpatient Hospital Stay (HOSPITAL_COMMUNITY): Payer: 59

## 2014-09-02 LAB — URINE CULTURE
CULTURE: NO GROWTH
Colony Count: NO GROWTH

## 2014-09-02 LAB — BASIC METABOLIC PANEL
Anion gap: 10 (ref 5–15)
BUN: 12 mg/dL (ref 6–23)
CALCIUM: 8.4 mg/dL (ref 8.4–10.5)
CO2: 27 mEq/L (ref 19–32)
Chloride: 105 mEq/L (ref 96–112)
Creatinine, Ser: 0.87 mg/dL (ref 0.50–1.10)
GFR, EST AFRICAN AMERICAN: 82 mL/min — AB (ref >90)
GFR, EST NON AFRICAN AMERICAN: 70 mL/min — AB (ref >90)
GLUCOSE: 114 mg/dL — AB (ref 70–99)
POTASSIUM: 4.3 meq/L (ref 3.7–5.3)
Sodium: 142 mEq/L (ref 137–147)

## 2014-09-02 LAB — CBC
HCT: 36.8 % (ref 36.0–46.0)
Hemoglobin: 11.9 g/dL — ABNORMAL LOW (ref 12.0–15.0)
MCH: 29.9 pg (ref 26.0–34.0)
MCHC: 32.3 g/dL (ref 30.0–36.0)
MCV: 92.5 fL (ref 78.0–100.0)
PLATELETS: 238 10*3/uL (ref 150–400)
RBC: 3.98 MIL/uL (ref 3.87–5.11)
RDW: 13.4 % (ref 11.5–15.5)
WBC: 9.1 10*3/uL (ref 4.0–10.5)

## 2014-09-02 MED ORDER — WHITE PETROLATUM GEL
Status: AC
  Administered 2014-09-02: 0.2
  Filled 2014-09-02: qty 5

## 2014-09-02 NOTE — Consult Note (Signed)
Seen and agree  

## 2014-09-02 NOTE — Progress Notes (Signed)
Seen and agree  

## 2014-09-02 NOTE — Progress Notes (Signed)
Patient ID: Christina Campos, female   DOB: 1951-05-19, 63 y.o.   MRN: [id number]    Subjective: Pt still c/o abdominal pain.  No flatus or BM  Objective: Vital signs in last 24 hours: Temp:  [98.2 F (36.8 C)-98.7 F (37.1 C)] 98.4 F (36.9 C) (10/08 0626) Pulse Rate:  [67-98] 67 (10/08 0626) Resp:  [16] 16 (10/08 0626) BP: (109-125)/(54-70) 109/62 mmHg (10/08 0626) SpO2:  [86 %-100 %] 94 % (10/08 0626) Weight:  [199 lb 15.7 oz (90.71 kg)] 199 lb 15.7 oz (90.71 kg) (10/07 1444) Last BM Date: 08/31/14  Intake/Output from previous day: 10/07 0701 - 10/08 0700 In: 3427.3 [I.V.:3277.3; IV Piggyback:150] Out: 450 [Emesis/NG output:200] Intake/Output this shift:    PE: Abd: soft, but quite tender in RLQ the most, but tender still in RUQ and epigastrium.  Minimal left sided tenderness, hypoactive BS, NGT with bilious output. Heart: regular  Lab Results:   Recent Labs  09/01/14 1846 09/02/14 0430  WBC 10.5 9.1  HGB 12.3 11.9*  HCT 37.7 36.8  PLT 247 238   BMET  Recent Labs  09/01/14 0125 09/01/14 1846 09/02/14 0430  NA 140  --  142  K 4.2  --  4.3  CL 100  --  105  CO2 25  --  27  GLUCOSE 150*  --  114*  BUN 15  --  12  CREATININE 0.95 0.81 0.87  CALCIUM 9.9  --  8.4   PT/INR No results found for this basename: LABPROT, INR,  in the last 72 hours CMP     Component Value Date/Time   NA 142 09/02/2014 0430   K 4.3 09/02/2014 0430   CL 105 09/02/2014 0430   CO2 27 09/02/2014 0430   GLUCOSE 114* 09/02/2014 0430   BUN 12 09/02/2014 0430   CREATININE 0.87 09/02/2014 0430   CALCIUM 8.4 09/02/2014 0430   PROT 8.2 09/01/2014 0125   ALBUMIN 4.2 09/01/2014 0125   AST 16 09/01/2014 0125   ALT 14 09/01/2014 0125   ALKPHOS 139* 09/01/2014 0125   BILITOT 0.3 09/01/2014 0125   GFRNONAA 70* 09/02/2014 0430   GFRAA 82* 09/02/2014 0430   Lipase     Component Value Date/Time   LIPASE 29 09/01/2014 0125       Studies/Results: Ct Abdomen Pelvis W Contrast  09/01/2014   CLINICAL  DATA:  Initial evaluation for epigastric and lower abdominal pain  EXAM: CT ABDOMEN AND PELVIS WITH CONTRAST  TECHNIQUE: Multidetector CT imaging of the abdomen and pelvis was performed using the standard protocol following bolus administration of intravenous contrast.  CONTRAST:  153mL OMNIPAQUE IOHEXOL 300 MG/ML  SOLN  COMPARISON:  Prior radiograph from earlier the same day.  FINDINGS: Linear opacities within the bilateral lung bases are most consistent with atelectasis. No pleural or pericardial effusion.  The liver demonstrates a normal contrast enhanced appearance. Gallbladder is surgically absent. No biliary dilatation. Spleen, adrenal glands, and pancreas demonstrate a normal contrast enhanced appearance.  Kidneys are equal in size with symmetric enhancement. Subcentimeter hypodensity within the right kidney is too small the characterize by CT, but statistically likely represents a small cyst. No nephrolithiasis, hydronephrosis, or focal enhancing renal mass.  Stomach within normal limits.  Proximal small bowel within normal limits. Multiple prominent fluid-filled loops of small bowel are seen clustered within the right lower quadrant, measuring up to 3 cm in diameter. There is an apparent transition point within the lower mid abdomen (series 2, image 69). There is fecalization  of the small bowel just proximal to this. The ileum is decompressed distally. Finding is concerning for small bowel obstruction. Free fluid within the pelvis and lower abdomen is likely reactive in nature.  Colon is decompressed and within normal limits. Scattered diverticula present without acute diverticulitis.  Bladder within normal limits.  Uterus and ovaries are not visualized.  No free intraperitoneal air.  No pathologically enlarged intra-abdominal pelvic lymph nodes. Mild calcified atheromatous disease seen within the infrarenal aorta without aneurysm.  No acute osseous abnormality. No worrisome lytic or blastic osseous  lesions. Prominent bilateral facet arthrosis noted at L5-S1 without pars defect.  IMPRESSION: 1. Multiple mildly dilated fluid-filled loops of small bowel clustered within the right lower quadrant with transition point in the lower mid abdomen. Findings most consistent with small bowel obstruction. Underlying adhesive disease is suspected. 2. Small volume free fluid within the lower abdomen and pelvis, likely reactive in nature. 3. Colonic diverticulosis without acute diverticulitis. 4. Status post cholecystectomy and hysterectomy.   Electronically Signed   By: Jeannine Boga M.D.   On: 09/01/2014 06:54   Dg Abd 2 Views  09/02/2014   CLINICAL DATA:  Small bowel obstruction, abdominal pain  EXAM: ABDOMEN - 2 VIEW  COMPARISON:  09/01/2014  FINDINGS: NG tube coiled within stomach with tip in proximal stomach. Distended small bowel loops with air-fluid levels are noted and left abdomen consistent with small bowel obstruction.  IMPRESSION: Distended small bowel loops with air-fluid levels in left abdomen consistent with small bowel obstruction. NG tube with tip in proximal stomach.   Electronically Signed   By: Lahoma Crocker M.D.   On: 09/02/2014 09:06   Dg Abd Acute W/chest  09/01/2014   CLINICAL DATA:  Acute onset of epigastric abdominal pain, extending to the umbilicus. Nausea and vomiting. Assess for free intra-abdominal air. Initial encounter.  EXAM: ACUTE ABDOMEN SERIES (ABDOMEN 2 VIEW & CHEST 1 VIEW)  COMPARISON:  Chest radiograph performed 10/15/2013  FINDINGS: The lungs are well-aerated. Mild left basilar atelectasis is noted. There is no evidence of pleural effusion or pneumothorax. The cardiomediastinal silhouette is within normal limits.  The visualized bowel gas pattern is unremarkable. Scattered stool and air are seen within the colon; there is no evidence of small bowel dilatation to suggest obstruction. No free intra-abdominal air is identified on the provided upright view. Clips are noted within  the right upper quadrant, reflecting prior cholecystectomy.  No acute osseous abnormalities are seen; the sacroiliac joints are unremarkable in appearance.  IMPRESSION: 1. Unremarkable bowel gas pattern; no free intra-abdominal air seen. Small to moderate amount of stool noted in the brain 2. Mild left basilar atelectasis noted; lungs otherwise clear.   Electronically Signed   By: Garald Balding M.D.   On: 09/01/2014 05:18    Anti-infectives: Anti-infectives   Start     Dose/Rate Route Frequency Ordered Stop   09/01/14 1700  levofloxacin (LEVAQUIN) IVPB 750 mg     750 mg 100 mL/hr over 90 Minutes Intravenous Every 24 hours 09/01/14 1534         Assessment/Plan  1. SBO  Plan: 1. The patient does have some air and stool on her x-ray, but still with some distended loops of small bowel 2. I am concerned about how much pain she is still having.  Her vitals are stable and her WBC is normal so I doubt she has any ischemia, but will follow closely.  3. Repeat films in the am as well as labs.  LOS: 1 day    Braedyn Riggle E 09/02/2014, 10:38 AM Pager: [phone]

## 2014-09-02 NOTE — Progress Notes (Signed)
Patient Demographics  Christina Campos, is a 63 y.o. female, DOB - 1951/06/27, YYT:[id number]  Admit date - 09/01/2014   Admitting Physician No admitting provider for patient encounter.  Outpatient Primary MD for the patient is Irven Shelling, MD  LOS - 1   Chief Complaint  Patient presents with  . Abdominal Pain        Subjective:   [facility] today has, No headache, No chest pain,  No new weakness tingling or numbness, No Cough - SOB. Mild generalized abdominal pain with nausea  Assessment & Plan    1. SBO with transition point. Likely due to adhesions from previous bowel surgeries, Continue bowel rest, NG tube for decompression, surgery following. May require surgical correction.    2. Essential hypertension. As needed IV hydralazine.    3. Dyslipidemia. Statin once taking orally.       Code Status: Full  Family Communication: None present  Disposition Plan: Home   Procedures CT abdomen pelvis   Consults [facility]   Medications  Scheduled Meds: . heparin  5,000 Units Subcutaneous 3 times per day  . levofloxacin (LEVAQUIN) IV  750 mg Intravenous Q24H  . metoprolol  2.5 mg Intravenous 4 times per day  . pantoprazole (PROTONIX) IV  40 mg Intravenous Q24H   Continuous Infusions: . dextrose 5 % and 0.45 % NaCl with KCl 20 mEq/L 75 mL/hr at 09/02/14 0743   PRN Meds:.acetaminophen, acetaminophen, hydrALAZINE, morphine injection, ondansetron (ZOFRAN) IV  DVT Prophylaxis    Heparin   Lab Results  Component Value Date   PLT 238 09/02/2014    Antibiotics     Anti-infectives   Start     Dose/Rate Route Frequency Ordered Stop   09/01/14 1700  levofloxacin (LEVAQUIN) IVPB 750 mg     750 mg 100 mL/hr over 90 Minutes Intravenous Every 24 hours 09/01/14 1534             Objective:   Filed Vitals:   09/01/14 1357 09/01/14 1444 09/01/14 2300 09/02/14 0626  BP: 125/64  116/56 109/62  Pulse: 87  88 67  Temp: 98.2 F (36.8 C)  98.7 F (37.1 C) 98.4 F (36.9 C)  TempSrc: Oral  Oral   Resp:   16 16  Height:  5\' 7"  (1.702 m)    Weight:  90.71 kg (199 lb 15.7 oz)    SpO2: 97%  92% 94%    Wt Readings from Last 3 Encounters:  09/01/14 90.71 kg (199 lb 15.7 oz)  12/04/13 93.985 kg (207 lb 3.2 oz)  10/20/13 97.886 kg (215 lb 12.8 oz)     Intake/Output Summary (Last 24 hours) at 09/02/14 1030 Last data filed at 09/02/14 0927  Gross per 24 hour  Intake 1427.25 ml  Output    450 ml  Net 977.25 ml     Physical Exam  Awake Alert, Oriented X 3, No new F.N deficits, Normal affect [place].AT,PERRAL Supple Neck,No JVD, No cervical lymphadenopathy appriciated.  Symmetrical Chest wall movement, Good air movement bilaterally, CTAB RRR,No Gallops,Rubs or new Murmurs, No Parasternal Heave Hypoactive B.Sounds, Abd Soft, mild diffuse tenderness, NG-tube in place, No organomegaly appriciated, No rebound - guarding or rigidity. No Cyanosis, Clubbing or edema, No new Rash or bruise  Data Review   Micro Results No results found for this or any previous visit (from the past 240 hour(s)).  Radiology Reports Ct Abdomen Pelvis W Contrast  09/01/2014   CLINICAL DATA:  Initial evaluation for epigastric and lower abdominal pain  EXAM: CT ABDOMEN AND PELVIS WITH CONTRAST  TECHNIQUE: Multidetector CT imaging of the abdomen and pelvis was performed using the standard protocol following bolus administration of intravenous contrast.  CONTRAST:  181mL OMNIPAQUE IOHEXOL 300 MG/ML  SOLN  COMPARISON:  Prior radiograph from earlier the same day.  FINDINGS: Linear opacities within the bilateral lung bases are most consistent with atelectasis. No pleural or pericardial effusion.  The liver demonstrates a normal contrast enhanced appearance. Gallbladder is surgically  absent. No biliary dilatation. Spleen, adrenal glands, and pancreas demonstrate a normal contrast enhanced appearance.  Kidneys are equal in size with symmetric enhancement. Subcentimeter hypodensity within the right kidney is too small the characterize by CT, but statistically likely represents a small cyst. No nephrolithiasis, hydronephrosis, or focal enhancing renal mass.  Stomach within normal limits.  Proximal small bowel within normal limits. Multiple prominent fluid-filled loops of small bowel are seen clustered within the right lower quadrant, measuring up to 3 cm in diameter. There is an apparent transition point within the lower mid abdomen (series 2, image 69). There is fecalization of the small bowel just proximal to this. The ileum is decompressed distally. Finding is concerning for small bowel obstruction. Free fluid within the pelvis and lower abdomen is likely reactive in nature.  Colon is decompressed and within normal limits. Scattered diverticula present without acute diverticulitis.  Bladder within normal limits.  Uterus and ovaries are not visualized.  No free intraperitoneal air.  No pathologically enlarged intra-abdominal pelvic lymph nodes. Mild calcified atheromatous disease seen within the infrarenal aorta without aneurysm.  No acute osseous abnormality. No worrisome lytic or blastic osseous lesions. Prominent bilateral facet arthrosis noted at L5-S1 without pars defect.  IMPRESSION: 1. Multiple mildly dilated fluid-filled loops of small bowel clustered within the right lower quadrant with transition point in the lower mid abdomen. Findings most consistent with small bowel obstruction. Underlying adhesive disease is suspected. 2. Small volume free fluid within the lower abdomen and pelvis, likely reactive in nature. 3. Colonic diverticulosis without acute diverticulitis. 4. Status post cholecystectomy and hysterectomy.   Electronically Signed   By: Jeannine Boga M.D.   On: 09/01/2014  06:54   Dg Abd 2 Views  09/02/2014   CLINICAL DATA:  Small bowel obstruction, abdominal pain  EXAM: ABDOMEN - 2 VIEW  COMPARISON:  09/01/2014  FINDINGS: NG tube coiled within stomach with tip in proximal stomach. Distended small bowel loops with air-fluid levels are noted and left abdomen consistent with small bowel obstruction.  IMPRESSION: Distended small bowel loops with air-fluid levels in left abdomen consistent with small bowel obstruction. NG tube with tip in proximal stomach.   Electronically Signed   By: Lahoma Crocker M.D.   On: 09/02/2014 09:06   Dg Abd Acute W/chest  09/01/2014   CLINICAL DATA:  Acute onset of epigastric abdominal pain, extending to the umbilicus. Nausea and vomiting. Assess for free intra-abdominal air. Initial encounter.  EXAM: ACUTE ABDOMEN SERIES (ABDOMEN 2 VIEW & CHEST 1 VIEW)  COMPARISON:  Chest radiograph performed 10/15/2013  FINDINGS: The lungs are well-aerated. Mild left basilar atelectasis is noted. There is no evidence of pleural effusion or pneumothorax. The cardiomediastinal silhouette is within normal limits.  The visualized bowel gas pattern is unremarkable.  Scattered stool and air are seen within the colon; there is no evidence of small bowel dilatation to suggest obstruction. No free intra-abdominal air is identified on the provided upright view. Clips are noted within the right upper quadrant, reflecting prior cholecystectomy.  No acute osseous abnormalities are seen; the sacroiliac joints are unremarkable in appearance.  IMPRESSION: 1. Unremarkable bowel gas pattern; no free intra-abdominal air seen. Small to moderate amount of stool noted in the brain 2. Mild left basilar atelectasis noted; lungs otherwise clear.   Electronically Signed   By: Garald Balding M.D.   On: 09/01/2014 05:18     CBC  Recent Labs Lab 09/01/14 0125 09/01/14 1846 09/02/14 0430  WBC 13.2* 10.5 9.1  HGB 14.1 12.3 11.9*  HCT 42.2 37.7 36.8  PLT 289 247 238  MCV 89.4 90.8 92.5   MCH 29.9 29.6 29.9  MCHC 33.4 32.6 32.3  RDW 12.8 13.2 13.4  LYMPHSABS 1.6  --   --   MONOABS 0.4  --   --   EOSABS 0.1  --   --   BASOSABS 0.0  --   --     Chemistries   Recent Labs Lab 09/01/14 0125 09/01/14 1846 09/02/14 0430  NA 140  --  142  K 4.2  --  4.3  CL 100  --  105  CO2 25  --  27  GLUCOSE 150*  --  114*  BUN 15  --  12  CREATININE 0.95 0.81 0.87  CALCIUM 9.9  --  8.4  MG  --  2.0  --   AST 16  --   --   ALT 14  --   --   ALKPHOS 139*  --   --   BILITOT 0.3  --   --    ------------------------------------------------------------------------------------------------------------------ estimated creatinine clearance is 78.5 ml/min (by C-G formula based on Cr of 0.87). ------------------------------------------------------------------------------------------------------------------  Recent Labs  09/01/14 1846  HGBA1C 5.9*   ------------------------------------------------------------------------------------------------------------------ No results found for this basename: CHOL, HDL, LDLCALC, TRIG, CHOLHDL, LDLDIRECT,  in the last 72 hours ------------------------------------------------------------------------------------------------------------------ No results found for this basename: TSH, T4TOTAL, FREET3, T3FREE, THYROIDAB,  in the last 72 hours ------------------------------------------------------------------------------------------------------------------ No results found for this basename: VITAMINB12, FOLATE, FERRITIN, TIBC, IRON, RETICCTPCT,  in the last 72 hours  Coagulation profile No results found for this basename: INR, PROTIME,  in the last 168 hours  No results found for this basename: DDIMER,  in the last 72 hours  Cardiac Enzymes No results found for this basename: CK, CKMB, TROPONINI, MYOGLOBIN,  in the last 168 hours ------------------------------------------------------------------------------------------------------------------ No  components found with this basename: POCBNP,      Time Spent in minutes  35   Elwanda Moger K M.D on 09/02/2014 at 10:30 AM  Between 7am to 7pm - Pager - [phone]  After 7pm go to [email or web address] - password [email or web address]  And look for the night coverage person covering for me after hours  [facility] Office  [phone]

## 2014-09-03 ENCOUNTER — Inpatient Hospital Stay (HOSPITAL_COMMUNITY): Payer: 59

## 2014-09-03 LAB — BASIC METABOLIC PANEL
ANION GAP: 13 (ref 5–15)
BUN: 9 mg/dL (ref 6–23)
CHLORIDE: 102 meq/L (ref 96–112)
CO2: 26 mEq/L (ref 19–32)
Calcium: 8.2 mg/dL — ABNORMAL LOW (ref 8.4–10.5)
Creatinine, Ser: 0.9 mg/dL (ref 0.50–1.10)
GFR calc Af Amer: 78 mL/min — ABNORMAL LOW (ref >90)
GFR, EST NON AFRICAN AMERICAN: 68 mL/min — AB (ref >90)
Glucose, Bld: 105 mg/dL — ABNORMAL HIGH (ref 70–99)
POTASSIUM: 4.3 meq/L (ref 3.7–5.3)
SODIUM: 141 meq/L (ref 137–147)

## 2014-09-03 LAB — CBC
HCT: 34.2 % — ABNORMAL LOW (ref 36.0–46.0)
Hemoglobin: 11.2 g/dL — ABNORMAL LOW (ref 12.0–15.0)
MCH: 29.9 pg (ref 26.0–34.0)
MCHC: 32.7 g/dL (ref 30.0–36.0)
MCV: 91.2 fL (ref 78.0–100.0)
Platelets: 225 10*3/uL (ref 150–400)
RBC: 3.75 MIL/uL — ABNORMAL LOW (ref 3.87–5.11)
RDW: 13 % (ref 11.5–15.5)
WBC: 7.4 10*3/uL (ref 4.0–10.5)

## 2014-09-03 MED ORDER — BISACODYL 10 MG RE SUPP
10.0000 mg | Freq: Once | RECTAL | Status: AC
  Administered 2014-09-03: 10 mg via RECTAL
  Filled 2014-09-03: qty 1

## 2014-09-03 MED ORDER — KCL IN DEXTROSE-NACL 20-5-0.45 MEQ/L-%-% IV SOLN
INTRAVENOUS | Status: DC
  Administered 2014-09-04: 09:00:00 via INTRAVENOUS
  Filled 2014-09-03 (×2): qty 1000

## 2014-09-03 NOTE — Progress Notes (Signed)
Patient ID: Christina Campos, female   DOB: May 25, 1951, 63 y.o.   MRN: [id number]    Subjective: Pt still with some very high epigastric pain, but overall a little better.  Passed some flatus  Objective: Vital signs in last 24 hours: Temp:  [98.6 F (37 C)-98.9 F (37.2 C)] 98.9 F (37.2 C) 10/04/23 0527) Pulse Rate:  [65-91] 83 10/04/2023 0527) Resp:  [15-17] 16 October 04, 2023 0527) BP: (117-121)/(52-67) 118/66 mmHg Oct 04, 2023 0527) SpO2:  [90 %-94 %] 90 % 10-04-23 0527) Last BM Date: 08/31/14  Intake/Output from previous day: 10/08 0701 - Oct 04, 2023 0700 In: 966 [I.V.:966] Out: 1170 [Urine:850; Emesis/NG output:320] Intake/Output this shift:    PE: Abd: soft, ND, tender in epigastrium and RLQ, +BS,  Heart: regular Lungs: CTAB  Lab Results:   Recent Labs  09/02/14 0430 10/03/2014 0505  WBC 9.1 7.4  HGB 11.9* 11.2*  HCT 36.8 34.2*  PLT 238 225   BMET  Recent Labs  09/02/14 0430 03-Oct-2014 0505  NA 142 141  K 4.3 4.3  CL 105 102  CO2 27 26  GLUCOSE 114* 105*  BUN 12 9  CREATININE 0.87 0.90  CALCIUM 8.4 8.2*   PT/INR No results found for this basename: LABPROT, INR,  in the last 72 hours CMP     Component Value Date/Time   NA 141 10/03/2014 0505   K 4.3 2014-10-03 0505   CL 102 10-03-2014 0505   CO2 26 10/03/14 0505   GLUCOSE 105* October 03, 2014 0505   BUN 9 2014-10-03 0505   CREATININE 0.90 October 03, 2014 0505   CALCIUM 8.2* Oct 03, 2014 0505   PROT 8.2 09/01/2014 0125   ALBUMIN 4.2 09/01/2014 0125   AST 16 09/01/2014 0125   ALT 14 09/01/2014 0125   ALKPHOS 139* 09/01/2014 0125   BILITOT 0.3 09/01/2014 0125   GFRNONAA 68* Oct 03, 2014 0505   GFRAA 78* 10-03-14 0505   Lipase     Component Value Date/Time   LIPASE 29 09/01/2014 0125       Studies/Results: Dg Abd 2 Views  10-03-14   CLINICAL DATA:  63 year old female with small bowel obstruction. Evaluate evolution of bowel gas pattern.  EXAM: ABDOMEN - 2 VIEW  COMPARISON:  Prior abdominal radiographs 09/02/2014  FINDINGS: Similar position  of nasogastric tube coiled within the stomach. Significant interval improvement in mild gaseous distension of the small bowel in the mid abdomen. Gas and stool remain present throughout the entire course of the colon. The retained colonic stool burden is moderate and predominantly in the ascending, transverse and proximal descending colon. No evidence of free air were large volume ascites. Surgical clips in the right upper quadrant suggest prior cholecystectomy. Mild dependent atelectasis in the lower lobes.  IMPRESSION: 1. Improved bowel gas pattern consistent with resolving small bowel obstruction. 2. Unchanged moderate colonic stool burden. 3. Nasogastric tube remains in unchanged position within the stomach.   Electronically Signed   By: Jacqulynn Cadet M.D.   On: 10/03/2014 09:19   Dg Abd 2 Views  09/02/2014   CLINICAL DATA:  Small bowel obstruction, abdominal pain  EXAM: ABDOMEN - 2 VIEW  COMPARISON:  09/01/2014  FINDINGS: NG tube coiled within stomach with tip in proximal stomach. Distended small bowel loops with air-fluid levels are noted and left abdomen consistent with small bowel obstruction.  IMPRESSION: Distended small bowel loops with air-fluid levels in left abdomen consistent with small bowel obstruction. NG tube with tip in proximal stomach.   Electronically Signed   By: Orlean Bradford.D.  On: 09/02/2014 09:06    Anti-infectives: Anti-infectives   Start     Dose/Rate Route Frequency Ordered Stop   09/01/14 1700  levofloxacin (LEVAQUIN) IVPB 750 mg     750 mg 100 mL/hr over 90 Minutes Intravenous Every 24 hours 09/01/14 1534         Assessment/Plan  1. SBO  Plan: 1. X-rays show resolved bowel obstruction, but with a decent stool burden throughout her right, transverse, and descending colon.  It's not a large stool burden that I think would be causing all of her pain, but I am going to give a suppository today to get things moving to see if this makes her feel any better.  I have  written for her NGT to be clamped as well given no further SB dilatation.   LOS: 2 days    Tait Balistreri E 09/03/2014, 10:13 AM Pager: [phone]

## 2014-09-03 NOTE — Progress Notes (Signed)
Patient Demographics  Christina Campos, is a 63 y.o. female, DOB - March 10, 1951, [id number]  Admit date - 09/01/2014   Admitting Physician No admitting provider for patient encounter.  Outpatient Primary MD for the patient is Irven Shelling, MD  LOS - 2   Chief Complaint  Patient presents with  . Abdominal Pain        Subjective:   [facility] today has, No headache, No chest pain,  No new weakness tingling or numbness, No Cough - SOB. Mild generalized abdominal pain with nausea but some improvement  Assessment & Plan    1. SBO with transition point. Likely due to adhesions from previous bowel surgeriy and hysterectomy, Continue bowel rest, NG tube for decompression, surgery following. Repeat x-ray somewhat improved. Defer management of this problem to surgery.    2. Essential hypertension. As needed IV hydralazine.    3. Dyslipidemia. Statin once taking orally.       Code Status: Full  Family Communication: None present  Disposition Plan: Home   Procedures CT abdomen pelvis   Consults CCS   Medications  Scheduled Meds: . bisacodyl  10 mg Rectal Once  . heparin  5,000 Units Subcutaneous 3 times per day  . levofloxacin (LEVAQUIN) IV  750 mg Intravenous Q24H  . metoprolol  2.5 mg Intravenous 4 times per day  . pantoprazole (PROTONIX) IV  40 mg Intravenous Q24H   Continuous Infusions: . dextrose 5 % and 0.45 % NaCl with KCl 20 mEq/L 75 mL/hr at 09/02/14 2304   PRN Meds:.acetaminophen, acetaminophen, hydrALAZINE, morphine injection, ondansetron (ZOFRAN) IV  DVT Prophylaxis    Heparin   Lab Results  Component Value Date   PLT 225 09/03/2014    Antibiotics     Anti-infectives   Start     Dose/Rate Route Frequency Ordered Stop   09/01/14 1700  levofloxacin  (LEVAQUIN) IVPB 750 mg     750 mg 100 mL/hr over 90 Minutes Intravenous Every 24 hours 09/01/14 1534            Objective:   Filed Vitals:   09/02/14 1309 09/02/14 1700 09/02/14 2118 09/03/14 0527  BP: 117/53 121/67 118/59 118/66  Pulse: 89 91 86 83  Temp: 98.7 F (37.1 C)  98.6 F (37 C) 98.9 F (37.2 C)  TempSrc: Oral  Oral Oral  Resp: 15  17 16   Height:      Weight:      SpO2: 93%  94% 90%    Wt Readings from Last 3 Encounters:  09/01/14 90.71 kg (199 lb 15.7 oz)  12/04/13 93.985 kg (207 lb 3.2 oz)  10/20/13 97.886 kg (215 lb 12.8 oz)     Intake/Output Summary (Last 24 hours) at 09/03/14 1017 Last data filed at 09/03/14 0600  Gross per 24 hour  Intake    966 ml  Output   1170 ml  Net   -204 ml     Physical Exam  Awake Alert, Oriented X 3, No new F.N deficits, Normal affect [place].AT,PERRAL Supple Neck,No JVD, No cervical lymphadenopathy appriciated.  Symmetrical Chest wall movement, Good air movement bilaterally, CTAB RRR,No Gallops,Rubs or new Murmurs, No Parasternal Heave Hypoactive B.Sounds, Abd Soft, mild diffuse tenderness, NG-tube in place, No organomegaly appriciated, No  rebound - guarding or rigidity. No Cyanosis, Clubbing or edema, No new Rash or bruise      Data Review   Micro Results Recent Results (from the past 240 hour(s))  URINE CULTURE     Status: None   Collection Time    09/01/14  6:23 AM      Result Value Ref Range Status   Specimen Description URINE, CLEAN CATCH   Final   Special Requests NONE   Final   Culture  Setup Time     Final   Value: 09/01/2014 17:12     Performed at [place]     Final   Value: NO GROWTH     Performed at [facility]   Culture     Final   Value: NO GROWTH     Performed at [facility]   Report Status 09/02/2014 FINAL   Final    Radiology Reports Ct Abdomen Pelvis W Contrast  09/01/2014   CLINICAL DATA:  Initial evaluation for epigastric and lower  abdominal pain  EXAM: CT ABDOMEN AND PELVIS WITH CONTRAST  TECHNIQUE: Multidetector CT imaging of the abdomen and pelvis was performed using the standard protocol following bolus administration of intravenous contrast.  CONTRAST:  141mL OMNIPAQUE IOHEXOL 300 MG/ML  SOLN  COMPARISON:  Prior radiograph from earlier the same day.  FINDINGS: Linear opacities within the bilateral lung bases are most consistent with atelectasis. No pleural or pericardial effusion.  The liver demonstrates a normal contrast enhanced appearance. Gallbladder is surgically absent. No biliary dilatation. Spleen, adrenal glands, and pancreas demonstrate a normal contrast enhanced appearance.  Kidneys are equal in size with symmetric enhancement. Subcentimeter hypodensity within the right kidney is too small the characterize by CT, but statistically likely represents a small cyst. No nephrolithiasis, hydronephrosis, or focal enhancing renal mass.  Stomach within normal limits.  Proximal small bowel within normal limits. Multiple prominent fluid-filled loops of small bowel are seen clustered within the right lower quadrant, measuring up to 3 cm in diameter. There is an apparent transition point within the lower mid abdomen (series 2, image 69). There is fecalization of the small bowel just proximal to this. The ileum is decompressed distally. Finding is concerning for small bowel obstruction. Free fluid within the pelvis and lower abdomen is likely reactive in nature.  Colon is decompressed and within normal limits. Scattered diverticula present without acute diverticulitis.  Bladder within normal limits.  Uterus and ovaries are not visualized.  No free intraperitoneal air.  No pathologically enlarged intra-abdominal pelvic lymph nodes. Mild calcified atheromatous disease seen within the infrarenal aorta without aneurysm.  No acute osseous abnormality. No worrisome lytic or blastic osseous lesions. Prominent bilateral facet arthrosis noted at  L5-S1 without pars defect.  IMPRESSION: 1. Multiple mildly dilated fluid-filled loops of small bowel clustered within the right lower quadrant with transition point in the lower mid abdomen. Findings most consistent with small bowel obstruction. Underlying adhesive disease is suspected. 2. Small volume free fluid within the lower abdomen and pelvis, likely reactive in nature. 3. Colonic diverticulosis without acute diverticulitis. 4. Status post cholecystectomy and hysterectomy.   Electronically Signed   By: Jeannine Boga M.D.   On: 09/01/2014 06:54   Dg Abd 2 Views  09/02/2014   CLINICAL DATA:  Small bowel obstruction, abdominal pain  EXAM: ABDOMEN - 2 VIEW  COMPARISON:  09/01/2014  FINDINGS: NG tube coiled within stomach with tip in proximal stomach. Distended small bowel  loops with air-fluid levels are noted and left abdomen consistent with small bowel obstruction.  IMPRESSION: Distended small bowel loops with air-fluid levels in left abdomen consistent with small bowel obstruction. NG tube with tip in proximal stomach.   Electronically Signed   By: Lahoma Crocker M.D.   On: 09/02/2014 09:06   Dg Abd Acute W/chest  09/01/2014   CLINICAL DATA:  Acute onset of epigastric abdominal pain, extending to the umbilicus. Nausea and vomiting. Assess for free intra-abdominal air. Initial encounter.  EXAM: ACUTE ABDOMEN SERIES (ABDOMEN 2 VIEW & CHEST 1 VIEW)  COMPARISON:  Chest radiograph performed 10/15/2013  FINDINGS: The lungs are well-aerated. Mild left basilar atelectasis is noted. There is no evidence of pleural effusion or pneumothorax. The cardiomediastinal silhouette is within normal limits.  The visualized bowel gas pattern is unremarkable. Scattered stool and air are seen within the colon; there is no evidence of small bowel dilatation to suggest obstruction. No free intra-abdominal air is identified on the provided upright view. Clips are noted within the right upper quadrant, reflecting prior  cholecystectomy.  No acute osseous abnormalities are seen; the sacroiliac joints are unremarkable in appearance.  IMPRESSION: 1. Unremarkable bowel gas pattern; no free intra-abdominal air seen. Small to moderate amount of stool noted in the brain 2. Mild left basilar atelectasis noted; lungs otherwise clear.   Electronically Signed   By: Garald Balding M.D.   On: 09/01/2014 05:18     CBC  Recent Labs Lab 09/01/14 0125 09/01/14 1846 09/02/14 0430 09/03/14 0505  WBC 13.2* 10.5 9.1 7.4  HGB 14.1 12.3 11.9* 11.2*  HCT 42.2 37.7 36.8 34.2*  PLT 289 247 238 225  MCV 89.4 90.8 92.5 91.2  MCH 29.9 29.6 29.9 29.9  MCHC 33.4 32.6 32.3 32.7  RDW 12.8 13.2 13.4 13.0  LYMPHSABS 1.6  --   --   --   MONOABS 0.4  --   --   --   EOSABS 0.1  --   --   --   BASOSABS 0.0  --   --   --     Chemistries   Recent Labs Lab 09/01/14 0125 09/01/14 1846 09/02/14 0430 09/03/14 0505  NA 140  --  142 141  K 4.2  --  4.3 4.3  CL 100  --  105 102  CO2 25  --  27 26  GLUCOSE 150*  --  114* 105*  BUN 15  --  12 9  CREATININE 0.95 0.81 0.87 0.90  CALCIUM 9.9  --  8.4 8.2*  MG  --  2.0  --   --   AST 16  --   --   --   ALT 14  --   --   --   ALKPHOS 139*  --   --   --   BILITOT 0.3  --   --   --    ------------------------------------------------------------------------------------------------------------------ estimated creatinine clearance is 75.9 ml/min (by C-G formula based on Cr of 0.9). ------------------------------------------------------------------------------------------------------------------  Recent Labs  09/01/14 1846  HGBA1C 5.9*   ------------------------------------------------------------------------------------------------------------------ No results found for this basename: CHOL, HDL, LDLCALC, TRIG, CHOLHDL, LDLDIRECT,  in the last 72 hours ------------------------------------------------------------------------------------------------------------------ No results found  for this basename: TSH, T4TOTAL, FREET3, T3FREE, THYROIDAB,  in the last 72 hours ------------------------------------------------------------------------------------------------------------------ No results found for this basename: VITAMINB12, FOLATE, FERRITIN, TIBC, IRON, RETICCTPCT,  in the last 72 hours  Coagulation profile No results found for this basename: INR, PROTIME,  in the last 168  hours  No results found for this basename: DDIMER,  in the last 72 hours  Cardiac Enzymes No results found for this basename: CK, CKMB, TROPONINI, MYOGLOBIN,  in the last 168 hours ------------------------------------------------------------------------------------------------------------------ No components found with this basename: POCBNP,      Time Spent in minutes  35   Breckan Cafiero K M.D on 09/03/2014 at 10:17 AM  Between 7am to 7pm - Pager - [phone]  After 7pm go to [email or web address] - password [email or web address]  And look for the night coverage person covering for me after hours  [facility] Office  [phone]

## 2014-09-04 ENCOUNTER — Inpatient Hospital Stay (HOSPITAL_COMMUNITY): Payer: 59

## 2014-09-04 LAB — CBC
HCT: 32.6 % — ABNORMAL LOW (ref 36.0–46.0)
Hemoglobin: 10.8 g/dL — ABNORMAL LOW (ref 12.0–15.0)
MCH: 29.8 pg (ref 26.0–34.0)
MCHC: 33.1 g/dL (ref 30.0–36.0)
MCV: 90.1 fL (ref 78.0–100.0)
Platelets: 226 10*3/uL (ref 150–400)
RBC: 3.62 MIL/uL — AB (ref 3.87–5.11)
RDW: 12.5 % (ref 11.5–15.5)
WBC: 6.9 10*3/uL (ref 4.0–10.5)

## 2014-09-04 LAB — BASIC METABOLIC PANEL
ANION GAP: 11 (ref 5–15)
BUN: 8 mg/dL (ref 6–23)
CHLORIDE: 102 meq/L (ref 96–112)
CO2: 27 mEq/L (ref 19–32)
Calcium: 8.6 mg/dL (ref 8.4–10.5)
Creatinine, Ser: 0.85 mg/dL (ref 0.50–1.10)
GFR, EST AFRICAN AMERICAN: 84 mL/min — AB (ref >90)
GFR, EST NON AFRICAN AMERICAN: 72 mL/min — AB (ref >90)
Glucose, Bld: 115 mg/dL — ABNORMAL HIGH (ref 70–99)
POTASSIUM: 3.7 meq/L (ref 3.7–5.3)
Sodium: 140 mEq/L (ref 137–147)

## 2014-09-04 MED ORDER — KCL IN DEXTROSE-NACL 20-5-0.45 MEQ/L-%-% IV SOLN
INTRAVENOUS | Status: DC
  Administered 2014-09-05 – 2014-09-06 (×3): via INTRAVENOUS
  Filled 2014-09-04 (×4): qty 1000

## 2014-09-04 MED ORDER — BISACODYL 10 MG RE SUPP
10.0000 mg | Freq: Once | RECTAL | Status: AC
  Administered 2014-09-04: 10 mg via RECTAL
  Filled 2014-09-04: qty 1

## 2014-09-04 NOTE — Progress Notes (Signed)
Patient Demographics  Christina Campos, is a 63 y.o. female, DOB - 03/04/1951, JQB:[id number]  Admit date - 09/01/2014   Admitting Physician No admitting provider for patient encounter.  Outpatient Primary MD for the patient is Irven Shelling, MD  LOS - 3   Chief Complaint  Patient presents with  . Abdominal Pain        Subjective:   [facility] today has, No headache, No chest pain,  No new weakness tingling or numbness, No Cough - SOB. Mild generalized abdominal pain with nausea but some improvement, minimal flatus.  Assessment & Plan    1. SBO with transition point. Likely due to adhesions from previous bowel surgeriy and hysterectomy, Continue bowel rest, NG tube for decompression, surgery following. Repeat x-ray today again shows some improvement. Defer management of this problem to surgery. Still passing only minimal flatus.   2. Essential hypertension. As needed IV hydralazine.    3. Dyslipidemia. Statin once taking orally.       Code Status: Full  Family Communication: None present  Disposition Plan: Home   Procedures CT abdomen pelvis   Consults CCS   Medications  Scheduled Meds: . heparin  5,000 Units Subcutaneous 3 times per day  . levofloxacin (LEVAQUIN) IV  750 mg Intravenous Q24H  . metoprolol  2.5 mg Intravenous 4 times per day  . pantoprazole (PROTONIX) IV  40 mg Intravenous Q24H   Continuous Infusions: . dextrose 5 % and 0.45 % NaCl with KCl 20 mEq/L     PRN Meds:.acetaminophen, acetaminophen, hydrALAZINE, morphine injection, ondansetron (ZOFRAN) IV  DVT Prophylaxis    Heparin   Lab Results  Component Value Date   PLT 226 09/04/2014    Antibiotics     Anti-infectives   Start     Dose/Rate Route Frequency Ordered Stop   09/01/14 1700   levofloxacin (LEVAQUIN) IVPB 750 mg     750 mg 100 mL/hr over 90 Minutes Intravenous Every 24 hours 09/01/14 1534            Objective:   Filed Vitals:   09/03/14 1100 09/03/14 1341 09/03/14 2147 09/04/14 0541  BP: 118/59 140/67 122/57 113/48  Pulse: 84 87 80 79  Temp: 98.7 F (37.1 C) 98 F (36.7 C) 98.4 F (36.9 C) 98.2 F (36.8 C)  TempSrc: Oral Oral Oral Oral  Resp: 16 19 16 16   Height:      Weight:      SpO2: 92% 94% 93% 91%    Wt Readings from Last 3 Encounters:  09/01/14 90.71 kg (199 lb 15.7 oz)  12/04/13 93.985 kg (207 lb 3.2 oz)  10/20/13 97.886 kg (215 lb 12.8 oz)     Intake/Output Summary (Last 24 hours) at 09/04/14 0957 Last data filed at 09/04/14 0600  Gross per 24 hour  Intake 1335.75 ml  Output    250 ml  Net 1085.75 ml     Physical Exam  Awake Alert, Oriented X 3, No new F.N deficits, Normal affect [place].AT,PERRAL Supple Neck,No JVD, No cervical lymphadenopathy appriciated.  Symmetrical Chest wall movement, Good air movement bilaterally, CTAB RRR,No Gallops,Rubs or new Murmurs, No Parasternal Heave Hypoactive B.Sounds, Abd Soft, mild diffuse tenderness, NG-tube in place, No organomegaly appriciated, No rebound - guarding  or rigidity. No Cyanosis, Clubbing or edema, No new Rash or bruise      Data Review   Micro Results Recent Results (from the past 240 hour(s))  URINE CULTURE     Status: None   Collection Time    09/01/14  6:23 AM      Result Value Ref Range Status   Specimen Description URINE, CLEAN CATCH   Final   Special Requests NONE   Final   Culture  Setup Time     Final   Value: 09/01/2014 17:12     Performed at [place]     Final   Value: NO GROWTH     Performed at [facility]   Culture     Final   Value: NO GROWTH     Performed at [facility]   Report Status 09/02/2014 FINAL   Final    Radiology Reports Ct Abdomen Pelvis W Contrast  09/01/2014   CLINICAL DATA:  Initial  evaluation for epigastric and lower abdominal pain  EXAM: CT ABDOMEN AND PELVIS WITH CONTRAST  TECHNIQUE: Multidetector CT imaging of the abdomen and pelvis was performed using the standard protocol following bolus administration of intravenous contrast.  CONTRAST:  138mL OMNIPAQUE IOHEXOL 300 MG/ML  SOLN  COMPARISON:  Prior radiograph from earlier the same day.  FINDINGS: Linear opacities within the bilateral lung bases are most consistent with atelectasis. No pleural or pericardial effusion.  The liver demonstrates a normal contrast enhanced appearance. Gallbladder is surgically absent. No biliary dilatation. Spleen, adrenal glands, and pancreas demonstrate a normal contrast enhanced appearance.  Kidneys are equal in size with symmetric enhancement. Subcentimeter hypodensity within the right kidney is too small the characterize by CT, but statistically likely represents a small cyst. No nephrolithiasis, hydronephrosis, or focal enhancing renal mass.  Stomach within normal limits.  Proximal small bowel within normal limits. Multiple prominent fluid-filled loops of small bowel are seen clustered within the right lower quadrant, measuring up to 3 cm in diameter. There is an apparent transition point within the lower mid abdomen (series 2, image 69). There is fecalization of the small bowel just proximal to this. The ileum is decompressed distally. Finding is concerning for small bowel obstruction. Free fluid within the pelvis and lower abdomen is likely reactive in nature.  Colon is decompressed and within normal limits. Scattered diverticula present without acute diverticulitis.  Bladder within normal limits.  Uterus and ovaries are not visualized.  No free intraperitoneal air.  No pathologically enlarged intra-abdominal pelvic lymph nodes. Mild calcified atheromatous disease seen within the infrarenal aorta without aneurysm.  No acute osseous abnormality. No worrisome lytic or blastic osseous lesions. Prominent  bilateral facet arthrosis noted at L5-S1 without pars defect.  IMPRESSION: 1. Multiple mildly dilated fluid-filled loops of small bowel clustered within the right lower quadrant with transition point in the lower mid abdomen. Findings most consistent with small bowel obstruction. Underlying adhesive disease is suspected. 2. Small volume free fluid within the lower abdomen and pelvis, likely reactive in nature. 3. Colonic diverticulosis without acute diverticulitis. 4. Status post cholecystectomy and hysterectomy.   Electronically Signed   By: Jeannine Boga M.D.   On: 09/01/2014 06:54   Dg Abd 2 Views  09/02/2014   CLINICAL DATA:  Small bowel obstruction, abdominal pain  EXAM: ABDOMEN - 2 VIEW  COMPARISON:  09/01/2014  FINDINGS: NG tube coiled within stomach with tip in proximal stomach. Distended small bowel loops with air-fluid  levels are noted and left abdomen consistent with small bowel obstruction.  IMPRESSION: Distended small bowel loops with air-fluid levels in left abdomen consistent with small bowel obstruction. NG tube with tip in proximal stomach.   Electronically Signed   By: Lahoma Crocker M.D.   On: 09/02/2014 09:06   Dg Abd Acute W/chest  09/01/2014   CLINICAL DATA:  Acute onset of epigastric abdominal pain, extending to the umbilicus. Nausea and vomiting. Assess for free intra-abdominal air. Initial encounter.  EXAM: ACUTE ABDOMEN SERIES (ABDOMEN 2 VIEW & CHEST 1 VIEW)  COMPARISON:  Chest radiograph performed 10/15/2013  FINDINGS: The lungs are well-aerated. Mild left basilar atelectasis is noted. There is no evidence of pleural effusion or pneumothorax. The cardiomediastinal silhouette is within normal limits.  The visualized bowel gas pattern is unremarkable. Scattered stool and air are seen within the colon; there is no evidence of small bowel dilatation to suggest obstruction. No free intra-abdominal air is identified on the provided upright view. Clips are noted within the right upper  quadrant, reflecting prior cholecystectomy.  No acute osseous abnormalities are seen; the sacroiliac joints are unremarkable in appearance.  IMPRESSION: 1. Unremarkable bowel gas pattern; no free intra-abdominal air seen. Small to moderate amount of stool noted in the brain 2. Mild left basilar atelectasis noted; lungs otherwise clear.   Electronically Signed   By: Garald Balding M.D.   On: 09/01/2014 05:18     CBC  Recent Labs Lab 09/01/14 0125 09/01/14 1846 09/02/14 0430 09/03/14 0505 09/04/14 0310  WBC 13.2* 10.5 9.1 7.4 6.9  HGB 14.1 12.3 11.9* 11.2* 10.8*  HCT 42.2 37.7 36.8 34.2* 32.6*  PLT 289 247 238 225 226  MCV 89.4 90.8 92.5 91.2 90.1  MCH 29.9 29.6 29.9 29.9 29.8  MCHC 33.4 32.6 32.3 32.7 33.1  RDW 12.8 13.2 13.4 13.0 12.5  LYMPHSABS 1.6  --   --   --   --   MONOABS 0.4  --   --   --   --   EOSABS 0.1  --   --   --   --   BASOSABS 0.0  --   --   --   --     Chemistries   Recent Labs Lab 09/01/14 0125 09/01/14 1846 09/02/14 0430 09/03/14 0505 09/04/14 0310  NA 140  --  142 141 140  K 4.2  --  4.3 4.3 3.7  CL 100  --  105 102 102  CO2 25  --  27 26 27   GLUCOSE 150*  --  114* 105* 115*  BUN 15  --  12 9 8   CREATININE 0.95 0.81 0.87 0.90 0.85  CALCIUM 9.9  --  8.4 8.2* 8.6  MG  --  2.0  --   --   --   AST 16  --   --   --   --   ALT 14  --   --   --   --   ALKPHOS 139*  --   --   --   --   BILITOT 0.3  --   --   --   --    ------------------------------------------------------------------------------------------------------------------ estimated creatinine clearance is 80.3 ml/min (by C-G formula based on Cr of 0.85). ------------------------------------------------------------------------------------------------------------------  Recent Labs  09/01/14 1846  HGBA1C 5.9*   ------------------------------------------------------------------------------------------------------------------ No results found for this basename: CHOL, HDL, LDLCALC, TRIG,  CHOLHDL, LDLDIRECT,  in the last 72 hours ------------------------------------------------------------------------------------------------------------------ No results found for this basename: TSH, T4TOTAL,  FREET3, T3FREE, THYROIDAB,  in the last 72 hours ------------------------------------------------------------------------------------------------------------------ No results found for this basename: VITAMINB12, FOLATE, FERRITIN, TIBC, IRON, RETICCTPCT,  in the last 72 hours  Coagulation profile No results found for this basename: INR, PROTIME,  in the last 168 hours  No results found for this basename: DDIMER,  in the last 72 hours  Cardiac Enzymes No results found for this basename: CK, CKMB, TROPONINI, MYOGLOBIN,  in the last 168 hours ------------------------------------------------------------------------------------------------------------------ No components found with this basename: POCBNP,      Time Spent in minutes  35   Leondro Coryell K M.D on 09/04/2014 at 9:57 AM  Between 7am to 7pm - Pager - [phone]  After 7pm go to [email or web address] - password [email or web address]  And look for the night coverage person covering for me after hours  [facility] Office  [phone]

## 2014-09-04 NOTE — Progress Notes (Signed)
Patient ID: Christina Campos, female   DOB: Apr 16, 1951, 63 y.o.   MRN: [id number] [facility] Surgery Progress Note:   * No surgery found *  Subjective: Mental status is clear.  Pain is much better.   Objective: Vital signs in last 24 hours: Temp:  [98 F (36.7 C)-98.7 F (37.1 C)] 98.2 F (36.8 C) (10/10 0541) Pulse Rate:  [79-87] 79 (10/10 0541) Resp:  [16-19] 16 (10/10 0541) BP: (113-140)/(48-67) 113/48 mmHg (10/10 0541) SpO2:  [91 %-94 %] 91 % (10/10 0541)  Intake/Output from previous day: 10/09 0701 - 10/10 0700 In: 1335.8 [I.V.:1315.8; NG/GT:20] Out: 250 [Emesis/NG output:250] Intake/Output this shift:    Physical Exam: Work of breathing is normal.  Abdomen is flat and soft.  Some pain with deep palpation.  Minimal flatus mostly after suppository.  3  Lab Results:  Results for orders placed during the hospital encounter of 09/01/14 (from the past 48 hour(s))  CBC     Status: Abnormal   Collection Time    09/03/14  5:05 AM      Result Value Ref Range   WBC 7.4  4.0 - 10.5 K/uL   Comment: WHITE COUNT CONFIRMED ON SMEAR   RBC 3.75 (*) 3.87 - 5.11 MIL/uL   Hemoglobin 11.2 (*) 12.0 - 15.0 g/dL   HCT 34.2 (*) 36.0 - 46.0 %   MCV 91.2  78.0 - 100.0 fL   MCH 29.9  26.0 - 34.0 pg   MCHC 32.7  30.0 - 36.0 g/dL   RDW 13.0  11.5 - 15.5 %   Platelets 225  150 - 400 K/uL  BASIC METABOLIC PANEL     Status: Abnormal   Collection Time    09/03/14  5:05 AM      Result Value Ref Range   Sodium 141  137 - 147 mEq/L   Potassium 4.3  3.7 - 5.3 mEq/L   Comment: HEMOLYSIS AT THIS LEVEL MAY AFFECT RESULT   Chloride 102  96 - 112 mEq/L   CO2 26  19 - 32 mEq/L   Glucose, Bld 105 (*) 70 - 99 mg/dL   BUN 9  6 - 23 mg/dL   Creatinine, Ser 0.90  0.50 - 1.10 mg/dL   Calcium 8.2 (*) 8.4 - 10.5 mg/dL   GFR calc non Af Amer 68 (*) >90 mL/min   GFR calc Af Amer 78 (*) >90 mL/min   Comment: (NOTE)     The eGFR has been calculated using the CKD EPI equation.     This calculation has not been  validated in all clinical situations.     eGFR's persistently <90 mL/min signify possible Chronic Kidney     Disease.   Anion gap 13  5 - 15  BASIC METABOLIC PANEL     Status: Abnormal   Collection Time    09/04/14  3:10 AM      Result Value Ref Range   Sodium 140  137 - 147 mEq/L   Potassium 3.7  3.7 - 5.3 mEq/L   Chloride 102  96 - 112 mEq/L   CO2 27  19 - 32 mEq/L   Glucose, Bld 115 (*) 70 - 99 mg/dL   BUN 8  6 - 23 mg/dL   Creatinine, Ser 0.85  0.50 - 1.10 mg/dL   Calcium 8.6  8.4 - 10.5 mg/dL   GFR calc non Af Amer 72 (*) >90 mL/min   GFR calc Af Amer 84 (*) >90 mL/min   Comment: (NOTE)  The eGFR has been calculated using the CKD EPI equation.     This calculation has not been validated in all clinical situations.     eGFR's persistently <90 mL/min signify possible Chronic Kidney     Disease.   Anion gap 11  5 - 15  CBC     Status: Abnormal   Collection Time    09/04/14  3:10 AM      Result Value Ref Range   WBC 6.9  4.0 - 10.5 K/uL   RBC 3.62 (*) 3.87 - 5.11 MIL/uL   Hemoglobin 10.8 (*) 12.0 - 15.0 g/dL   HCT 32.6 (*) 36.0 - 46.0 %   MCV 90.1  78.0 - 100.0 fL   MCH 29.8  26.0 - 34.0 pg   MCHC 33.1  30.0 - 36.0 g/dL   RDW 12.5  11.5 - 15.5 %   Platelets 226  150 - 400 K/uL    Radiology/Results: Dg Abd 2 Views  09/04/2014   CLINICAL DATA:  Bowel obstruction with pain  EXAM: ABDOMEN - 2 VIEW  COMPARISON:  September 03, 2014  FINDINGS: Supine and upright abdomen images were obtained. Nasogastric tube tip and side port are in the stomach. There is contrast in the colon. There is no bowel dilatation or air-fluid level to suggest obstruction. There is a paucity of small bowel gas. No free air. Surgical clips are noted in the right upper quadrant.  IMPRESSION: Nasogastric tube tip and side port in stomach. Paucity of small bowel gas. This finding could be due to resolving obstruction with a degree of underlying ileus or enteritis. Contrast is noted in the large bowel. Air is  present in the rectum.   Electronically Signed   By: Lowella Grip M.D.   On: 09/04/2014 08:48   Dg Abd 2 Views  09/03/2014   CLINICAL DATA:  63 year old female with small bowel obstruction. Evaluate evolution of bowel gas pattern.  EXAM: ABDOMEN - 2 VIEW  COMPARISON:  Prior abdominal radiographs 09/02/2014  FINDINGS: Similar position of nasogastric tube coiled within the stomach. Significant interval improvement in mild gaseous distension of the small bowel in the mid abdomen. Gas and stool remain present throughout the entire course of the colon. The retained colonic stool burden is moderate and predominantly in the ascending, transverse and proximal descending colon. No evidence of free air were large volume ascites. Surgical clips in the right upper quadrant suggest prior cholecystectomy. Mild dependent atelectasis in the lower lobes.  IMPRESSION: 1. Improved bowel gas pattern consistent with resolving small bowel obstruction. 2. Unchanged moderate colonic stool burden. 3. Nasogastric tube remains in unchanged position within the stomach.   Electronically Signed   By: Jacqulynn Cadet M.D.   On: 09/03/2014 09:19    Anti-infectives: Anti-infectives   Start     Dose/Rate Route Frequency Ordered Stop   09/01/14 1700  levofloxacin (LEVAQUIN) IVPB 750 mg     750 mg 100 mL/hr over 90 Minutes Intravenous Every 24 hours 09/01/14 1534        Assessment/Plan: Problem List: Patient Active Problem List   Diagnosis Date Noted  . SBO (small bowel obstruction) 09/01/2014  . Asthma, chronic 09/01/2014  . Leukocytosis 09/01/2014  . Hyperglycemia 09/01/2014  . PVC (premature ventricular contraction) 09/02/2013  . Chest tightness 09/02/2013  . HTN (hypertension) 09/02/2013  . Hypercholesterolemia 09/02/2013    Hopefully resolving partial SBO with contrast in the colon and less air in the small bowel.  Will continue NG till good  flatus.  Had lap chole by Donne Hazel about a year ago.   * No surgery  found *    LOS: 3 days   Matt B. Hassell Done, MD, [facility] [phone] beeper [phone]  09/04/2014 9:32 AM

## 2014-09-05 ENCOUNTER — Inpatient Hospital Stay (HOSPITAL_COMMUNITY): Payer: 59

## 2014-09-05 LAB — BASIC METABOLIC PANEL
ANION GAP: 14 (ref 5–15)
BUN: 8 mg/dL (ref 6–23)
CO2: 25 meq/L (ref 19–32)
Calcium: 8.8 mg/dL (ref 8.4–10.5)
Chloride: 100 mEq/L (ref 96–112)
Creatinine, Ser: 0.76 mg/dL (ref 0.50–1.10)
GFR calc Af Amer: >90 mL/min (ref >90)
GFR calc non Af Amer: 89 mL/min — ABNORMAL LOW (ref >90)
Glucose, Bld: 111 mg/dL — ABNORMAL HIGH (ref 70–99)
POTASSIUM: 4.1 meq/L (ref 3.7–5.3)
SODIUM: 139 meq/L (ref 137–147)

## 2014-09-05 NOTE — Progress Notes (Signed)
Patient ID: Christina Campos, female   DOB: [birth year], 63 y.o.   MRN: [id number] [facility] Surgery Progress Note:   * No surgery found *  Subjective: Mental status is clear.  Sitting up in rocking chair.  Minimal flatus except with dulcolax Objective: Vital signs in last 24 hours: Temp:  [97.9 F (36.6 C)-98.1 F (36.7 C)] 98.1 F (36.7 C) (10/11 0546) Pulse Rate:  [75-81] 75 (10/11 0546) Resp:  [16-17] 16 (10/11 0546) BP: (113-138)/(65-72) 129/65 mmHg (10/11 0546) SpO2:  [94 %-97 %] 94 % (10/11 0546)  Intake/Output from previous day: 10/10 0701 - 10/11 0700 In: 1998.8 [I.V.:1648.8; NG/GT:50; IV Piggyback:300] Out: 1300 [Urine:1000; Emesis/NG output:300] Intake/Output this shift:    Physical Exam: Work of breathing is not elevated.  NG in place to suction.  Distention resolved  Lab Results:  Results for orders placed during the hospital encounter of 09/01/14 (from the past 48 hour(s))  BASIC METABOLIC PANEL     Status: Abnormal   Collection Time    09/04/14  3:10 AM      Result Value Ref Range   Sodium 140  137 - 147 mEq/L   Potassium 3.7  3.7 - 5.3 mEq/L   Chloride 102  96 - 112 mEq/L   CO2 27  19 - 32 mEq/L   Glucose, Bld 115 (*) 70 - 99 mg/dL   BUN 8  6 - 23 mg/dL   Creatinine, Ser 0.85  0.50 - 1.10 mg/dL   Calcium 8.6  8.4 - 10.5 mg/dL   GFR calc non Af Amer 72 (*) >90 mL/min   GFR calc Af Amer 84 (*) >90 mL/min   Comment: (NOTE)     The eGFR has been calculated using the CKD EPI equation.     This calculation has not been validated in all clinical situations.     eGFR's persistently <90 mL/min signify possible Chronic Kidney     Disease.   Anion gap 11  5 - 15  CBC     Status: Abnormal   Collection Time    09/04/14  3:10 AM      Result Value Ref Range   WBC 6.9  4.0 - 10.5 K/uL   RBC 3.62 (*) 3.87 - 5.11 MIL/uL   Hemoglobin 10.8 (*) 12.0 - 15.0 g/dL   HCT 32.6 (*) 36.0 - 46.0 %   MCV 90.1  78.0 - 100.0 fL   MCH 29.8  26.0 - 34.0 pg   MCHC 33.1  30.0 -  36.0 g/dL   RDW 12.5  11.5 - 15.5 %   Platelets 226  150 - 400 K/uL  BASIC METABOLIC PANEL     Status: Abnormal   Collection Time    09/05/14  4:33 AM      Result Value Ref Range   Sodium 139  137 - 147 mEq/L   Potassium 4.1  3.7 - 5.3 mEq/L   Comment: HEMOLYSIS AT THIS LEVEL MAY AFFECT RESULT   Chloride 100  96 - 112 mEq/L   CO2 25  19 - 32 mEq/L   Glucose, Bld 111 (*) 70 - 99 mg/dL   BUN 8  6 - 23 mg/dL   Creatinine, Ser 0.76  0.50 - 1.10 mg/dL   Calcium 8.8  8.4 - 10.5 mg/dL   GFR calc non Af Amer 89 (*) >90 mL/min   GFR calc Af Amer >90  >90 mL/min   Comment: (NOTE)     The eGFR has been calculated using  the CKD EPI equation.     This calculation has not been validated in all clinical situations.     eGFR's persistently <90 mL/min signify possible Chronic Kidney     Disease.   Anion gap 14  5 - 15    Radiology/Results: Dg Abd 2 Views  09/05/2014   CLINICAL DATA:  Recent small bowel obstruction  EXAM: ABDOMEN - 2 VIEW  COMPARISON:  CT abdomen and pelvis September 01, 2014; abdominal radiographs September 04, 2014  FINDINGS: Supine and upright images were obtained. Nasogastric tube tip and side port are in the stomach. There is contrast in the colon. Overall bowel gas pattern currently is unremarkable. No obstruction or free air. There are surgical clips the right upper quadrant. Pelvic calcification in the lower left pelvis is consistent with a phlebolith.  IMPRESSION: Overall bowel gas pattern currently unremarkable. Nasogastric tube tip and side port in stomach. Surgical clips in right upper quadrant.   Electronically Signed   By: Lowella Grip M.D.   On: 09/05/2014 09:46   Dg Abd 2 Views  09/04/2014   CLINICAL DATA:  Bowel obstruction with pain  EXAM: ABDOMEN - 2 VIEW  COMPARISON:  September 03, 2014  FINDINGS: Supine and upright abdomen images were obtained. Nasogastric tube tip and side port are in the stomach. There is contrast in the colon. There is no bowel dilatation or  air-fluid level to suggest obstruction. There is a paucity of small bowel gas. No free air. Surgical clips are noted in the right upper quadrant.  IMPRESSION: Nasogastric tube tip and side port in stomach. Paucity of small bowel gas. This finding could be due to resolving obstruction with a degree of underlying ileus or enteritis. Contrast is noted in the large bowel. Air is present in the rectum.   Electronically Signed   By: Lowella Grip M.D.   On: 09/04/2014 08:48    Anti-infectives: Anti-infectives   Start     Dose/Rate Route Frequency Ordered Stop   09/01/14 1700  levofloxacin (LEVAQUIN) IVPB 750 mg     750 mg 100 mL/hr over 90 Minutes Intravenous Every 24 hours 09/01/14 1534        Assessment/Plan: Problem List: Patient Active Problem List   Diagnosis Date Noted  . SBO (small bowel obstruction) 09/01/2014  . Asthma, chronic 09/01/2014  . Leukocytosis 09/01/2014  . Hyperglycemia 09/01/2014  . PVC (premature ventricular contraction) 09/02/2013  . Chest tightness 09/02/2013  . HTN (hypertension) 09/02/2013  . Hypercholesterolemia 09/02/2013    Will clamp NG and give some ice chips.  Observe for flatus.  xrays are about the same.  It looks like it is resolving * No surgery found *    LOS: 4 days   Matt B. Hassell Done, MD, [facility] [phone] beeper [phone]  09/05/2014 10:40 AM

## 2014-09-05 NOTE — Progress Notes (Signed)
Patient Demographics  Christina Campos, is a 63 y.o. female, DOB - 03-07-1951, HQP:[id number]  Admit date - 09/01/2014   Admitting Physician No admitting provider for patient encounter.  Outpatient Primary MD for the patient is Irven Shelling, MD  LOS - 4   Chief Complaint  Patient presents with  . Abdominal Pain        Subjective:   [facility] today has, No headache, No chest pain,  No new weakness tingling or numbness, No Cough - SOB. Mild generalized abdominal pain with nausea but some improvement, minimal flatus.  Assessment & Plan    1. SBO with transition point. Likely due to adhesions from previous bowel surgeriy and hysterectomy, Continue bowel rest, NG tube for decompression, surgery following. Repeat x-ray from today is pending. Defer management of this problem to surgery. Still passing only minimal flatus.   2. Essential hypertension. As needed IV hydralazine.    3. Dyslipidemia. Statin once taking orally.       Code Status: Full  Family Communication: None present  Disposition Plan: Home   Procedures CT abdomen pelvis   Consults [facility]   Medications  Scheduled Meds: . heparin  5,000 Units Subcutaneous 3 times per day  . levofloxacin (LEVAQUIN) IV  750 mg Intravenous Q24H  . metoprolol  2.5 mg Intravenous 4 times per day  . pantoprazole (PROTONIX) IV  40 mg Intravenous Q24H   Continuous Infusions: . dextrose 5 % and 0.45 % NaCl with KCl 20 mEq/L 75 mL/hr at 09/05/14 0136   PRN Meds:.acetaminophen, acetaminophen, hydrALAZINE, morphine injection, ondansetron (ZOFRAN) IV  DVT Prophylaxis    Heparin   Lab Results  Component Value Date   PLT 226 09/04/2014    Antibiotics     Anti-infectives   Start     Dose/Rate Route Frequency Ordered Stop   09/01/14 1700  levofloxacin (LEVAQUIN) IVPB 750 mg     750 mg 100 mL/hr over 90 Minutes Intravenous Every 24 hours 09/01/14 1534            Objective:   Filed Vitals:   09/04/14 1313 09/04/14 1835 09/04/14 2214 09/05/14 0546  BP: 138/65 113/72 127/65 129/65  Pulse: 79 81 76 75  Temp: 97.9 F (36.6 C)  97.9 F (36.6 C) 98.1 F (36.7 C)  TempSrc: Oral  Oral   Resp: 17  16 16   Height:      Weight:      SpO2: 97%  96% 94%    Wt Readings from Last 3 Encounters:  09/01/14 90.71 kg (199 lb 15.7 oz)  12/04/13 93.985 kg (207 lb 3.2 oz)  10/20/13 97.886 kg (215 lb 12.8 oz)     Intake/Output Summary (Last 24 hours) at 09/05/14 0926 Last data filed at 09/05/14 0600  Gross per 24 hour  Intake 1998.75 ml  Output   1300 ml  Net 698.75 ml     Physical Exam  Awake Alert, Oriented X 3, No new F.N deficits, Normal affect [place].AT,PERRAL Supple Neck,No JVD, No cervical lymphadenopathy appriciated.  Symmetrical Chest wall movement, Good air movement bilaterally, CTAB RRR,No Gallops,Rubs or new Murmurs, No Parasternal Heave Hypoactive B.Sounds, Abd Soft, mild diffuse tenderness, NG-tube in place, No organomegaly appriciated, No rebound - guarding or rigidity. No  Cyanosis, Clubbing or edema, No new Rash or bruise      Data Review   Micro Results Recent Results (from the past 240 hour(s))  URINE CULTURE     Status: None   Collection Time    09/01/14  6:23 AM      Result Value Ref Range Status   Specimen Description URINE, CLEAN CATCH   Final   Special Requests NONE   Final   Culture  Setup Time     Final   Value: 09/01/2014 17:12     Performed at [facility]     Final   Value: NO GROWTH     Performed at [facility]   Culture     Final   Value: NO GROWTH     Performed at [facility]   Report Status 09/02/2014 FINAL   Final    Radiology Reports Ct Abdomen Pelvis W Contrast  09/01/2014   CLINICAL DATA:  Initial evaluation for  epigastric and lower abdominal pain  EXAM: CT ABDOMEN AND PELVIS WITH CONTRAST  TECHNIQUE: Multidetector CT imaging of the abdomen and pelvis was performed using the standard protocol following bolus administration of intravenous contrast.  CONTRAST:  138mL OMNIPAQUE IOHEXOL 300 MG/ML  SOLN  COMPARISON:  Prior radiograph from earlier the same day.  FINDINGS: Linear opacities within the bilateral lung bases are most consistent with atelectasis. No pleural or pericardial effusion.  The liver demonstrates a normal contrast enhanced appearance. Gallbladder is surgically absent. No biliary dilatation. Spleen, adrenal glands, and pancreas demonstrate a normal contrast enhanced appearance.  Kidneys are equal in size with symmetric enhancement. Subcentimeter hypodensity within the right kidney is too small the characterize by CT, but statistically likely represents a small cyst. No nephrolithiasis, hydronephrosis, or focal enhancing renal mass.  Stomach within normal limits.  Proximal small bowel within normal limits. Multiple prominent fluid-filled loops of small bowel are seen clustered within the right lower quadrant, measuring up to 3 cm in diameter. There is an apparent transition point within the lower mid abdomen (series 2, image 69). There is fecalization of the small bowel just proximal to this. The ileum is decompressed distally. Finding is concerning for small bowel obstruction. Free fluid within the pelvis and lower abdomen is likely reactive in nature.  Colon is decompressed and within normal limits. Scattered diverticula present without acute diverticulitis.  Bladder within normal limits.  Uterus and ovaries are not visualized.  No free intraperitoneal air.  No pathologically enlarged intra-abdominal pelvic lymph nodes. Mild calcified atheromatous disease seen within the infrarenal aorta without aneurysm.  No acute osseous abnormality. No worrisome lytic or blastic osseous lesions. Prominent bilateral facet  arthrosis noted at L5-S1 without pars defect.  IMPRESSION: 1. Multiple mildly dilated fluid-filled loops of small bowel clustered within the right lower quadrant with transition point in the lower mid abdomen. Findings most consistent with small bowel obstruction. Underlying adhesive disease is suspected. 2. Small volume free fluid within the lower abdomen and pelvis, likely reactive in nature. 3. Colonic diverticulosis without acute diverticulitis. 4. Status post cholecystectomy and hysterectomy.   Electronically Signed   By: Jeannine Boga M.D.   On: 09/01/2014 06:54   Dg Abd 2 Views  09/02/2014   CLINICAL DATA:  Small bowel obstruction, abdominal pain  EXAM: ABDOMEN - 2 VIEW  COMPARISON:  09/01/2014  FINDINGS: NG tube coiled within stomach with tip in proximal stomach. Distended small bowel loops with air-fluid levels are noted  and left abdomen consistent with small bowel obstruction.  IMPRESSION: Distended small bowel loops with air-fluid levels in left abdomen consistent with small bowel obstruction. NG tube with tip in proximal stomach.   Electronically Signed   By: Lahoma Crocker M.D.   On: 09/02/2014 09:06   Dg Abd Acute W/chest  09/01/2014   CLINICAL DATA:  Acute onset of epigastric abdominal pain, extending to the umbilicus. Nausea and vomiting. Assess for free intra-abdominal air. Initial encounter.  EXAM: ACUTE ABDOMEN SERIES (ABDOMEN 2 VIEW & CHEST 1 VIEW)  COMPARISON:  Chest radiograph performed 10/15/2013  FINDINGS: The lungs are well-aerated. Mild left basilar atelectasis is noted. There is no evidence of pleural effusion or pneumothorax. The cardiomediastinal silhouette is within normal limits.  The visualized bowel gas pattern is unremarkable. Scattered stool and air are seen within the colon; there is no evidence of small bowel dilatation to suggest obstruction. No free intra-abdominal air is identified on the provided upright view. Clips are noted within the right upper quadrant,  reflecting prior cholecystectomy.  No acute osseous abnormalities are seen; the sacroiliac joints are unremarkable in appearance.  IMPRESSION: 1. Unremarkable bowel gas pattern; no free intra-abdominal air seen. Small to moderate amount of stool noted in the brain 2. Mild left basilar atelectasis noted; lungs otherwise clear.   Electronically Signed   By: Garald Balding M.D.   On: 09/01/2014 05:18     CBC  Recent Labs Lab 09/01/14 0125 09/01/14 1846 09/02/14 0430 09/03/14 0505 09/04/14 0310  WBC 13.2* 10.5 9.1 7.4 6.9  HGB 14.1 12.3 11.9* 11.2* 10.8*  HCT 42.2 37.7 36.8 34.2* 32.6*  PLT 289 247 238 225 226  MCV 89.4 90.8 92.5 91.2 90.1  MCH 29.9 29.6 29.9 29.9 29.8  MCHC 33.4 32.6 32.3 32.7 33.1  RDW 12.8 13.2 13.4 13.0 12.5  LYMPHSABS 1.6  --   --   --   --   MONOABS 0.4  --   --   --   --   EOSABS 0.1  --   --   --   --   BASOSABS 0.0  --   --   --   --     Chemistries   Recent Labs Lab 09/01/14 0125 09/01/14 1846 09/02/14 0430 09/03/14 0505 09/04/14 0310 09/05/14 0433  NA 140  --  142 141 140 139  K 4.2  --  4.3 4.3 3.7 4.1  CL 100  --  105 102 102 100  CO2 25  --  27 26 27 25   GLUCOSE 150*  --  114* 105* 115* 111*  BUN 15  --  12 9 8 8   CREATININE 0.95 0.81 0.87 0.90 0.85 0.76  CALCIUM 9.9  --  8.4 8.2* 8.6 8.8  MG  --  2.0  --   --   --   --   AST 16  --   --   --   --   --   ALT 14  --   --   --   --   --   ALKPHOS 139*  --   --   --   --   --   BILITOT 0.3  --   --   --   --   --    ------------------------------------------------------------------------------------------------------------------ estimated creatinine clearance is 85.3 ml/min (by C-G formula based on Cr of 0.76). ------------------------------------------------------------------------------------------------------------------ No results found for this basename: HGBA1C,  in the last 72  hours ------------------------------------------------------------------------------------------------------------------ No results found  for this basename: CHOL, HDL, LDLCALC, TRIG, CHOLHDL, LDLDIRECT,  in the last 72 hours ------------------------------------------------------------------------------------------------------------------ No results found for this basename: TSH, T4TOTAL, FREET3, T3FREE, THYROIDAB,  in the last 72 hours ------------------------------------------------------------------------------------------------------------------ No results found for this basename: VITAMINB12, FOLATE, FERRITIN, TIBC, IRON, RETICCTPCT,  in the last 72 hours  Coagulation profile No results found for this basename: INR, PROTIME,  in the last 168 hours  No results found for this basename: DDIMER,  in the last 72 hours  Cardiac Enzymes No results found for this basename: CK, CKMB, TROPONINI, MYOGLOBIN,  in the last 168 hours ------------------------------------------------------------------------------------------------------------------ No components found with this basename: POCBNP,      Time Spent in minutes  35   Ahmar Pickrell K M.D on 09/05/2014 at 9:26 AM  Between 7am to 7pm - Pager - [phone]  After 7pm go to [email or web address] - password [email or web address]  And look for the night coverage person covering for me after hours  [facility] Office  [phone]

## 2014-09-05 NOTE — Progress Notes (Deleted)
Patient ID: Christina Campos, female   DOB: 1951-09-27, 63 y.o.   MRN: [id number] [facility] Surgery Progress Note:   * No surgery found *  Subjective: Mental status is clear.  Complaining of vomiting this am.   Objective: Vital signs in last 24 hours: Temp:  [97.9 F (36.6 C)-98.1 F (36.7 C)] 98.1 F (36.7 C) (10/11 0546) Pulse Rate:  [75-81] 75 (10/11 0546) Resp:  [16-17] 16 (10/11 0546) BP: (113-138)/(65-72) 129/65 mmHg (10/11 0546) SpO2:  [94 %-97 %] 94 % (10/11 0546)  Intake/Output from previous day: 10/10 0701 - 10/11 0700 In: 1998.8 [I.V.:1648.8; NG/GT:50; IV Piggyback:300] Out: 1300 [Urine:1000; Emesis/NG output:300] Intake/Output this shift:    Physical Exam: Work of breathing is not labored.  JP is bilious.    Lab Results:  Results for orders placed during the hospital encounter of 09/01/14 (from the past 48 hour(s))  BASIC METABOLIC PANEL     Status: Abnormal   Collection Time    09/04/14  3:10 AM      Result Value Ref Range   Sodium 140  137 - 147 mEq/L   Potassium 3.7  3.7 - 5.3 mEq/L   Chloride 102  96 - 112 mEq/L   CO2 27  19 - 32 mEq/L   Glucose, Bld 115 (*) 70 - 99 mg/dL   BUN 8  6 - 23 mg/dL   Creatinine, Ser 0.85  0.50 - 1.10 mg/dL   Calcium 8.6  8.4 - 10.5 mg/dL   GFR calc non Af Amer 72 (*) >90 mL/min   GFR calc Af Amer 84 (*) >90 mL/min   Comment: (NOTE)     The eGFR has been calculated using the CKD EPI equation.     This calculation has not been validated in all clinical situations.     eGFR's persistently <90 mL/min signify possible Chronic Kidney     Disease.   Anion gap 11  5 - 15  CBC     Status: Abnormal   Collection Time    09/04/14  3:10 AM      Result Value Ref Range   WBC 6.9  4.0 - 10.5 K/uL   RBC 3.62 (*) 3.87 - 5.11 MIL/uL   Hemoglobin 10.8 (*) 12.0 - 15.0 g/dL   HCT 32.6 (*) 36.0 - 46.0 %   MCV 90.1  78.0 - 100.0 fL   MCH 29.8  26.0 - 34.0 pg   MCHC 33.1  30.0 - 36.0 g/dL   RDW 12.5  11.5 - 15.5 %   Platelets 226  150 -  400 K/uL  BASIC METABOLIC PANEL     Status: Abnormal   Collection Time    09/05/14  4:33 AM      Result Value Ref Range   Sodium 139  137 - 147 mEq/L   Potassium 4.1  3.7 - 5.3 mEq/L   Comment: HEMOLYSIS AT THIS LEVEL MAY AFFECT RESULT   Chloride 100  96 - 112 mEq/L   CO2 25  19 - 32 mEq/L   Glucose, Bld 111 (*) 70 - 99 mg/dL   BUN 8  6 - 23 mg/dL   Creatinine, Ser 0.76  0.50 - 1.10 mg/dL   Calcium 8.8  8.4 - 10.5 mg/dL   GFR calc non Af Amer 89 (*) >90 mL/min   GFR calc Af Amer >90  >90 mL/min   Comment: (NOTE)     The eGFR has been calculated using the CKD EPI equation.  This calculation has not been validated in all clinical situations.     eGFR's persistently <90 mL/min signify possible Chronic Kidney     Disease.   Anion gap 14  5 - 15    Radiology/Results: Dg Abd 2 Views  09/05/2014   CLINICAL DATA:  Recent small bowel obstruction  EXAM: ABDOMEN - 2 VIEW  COMPARISON:  CT abdomen and pelvis September 01, 2014; abdominal radiographs September 04, 2014  FINDINGS: Supine and upright images were obtained. Nasogastric tube tip and side port are in the stomach. There is contrast in the colon. Overall bowel gas pattern currently is unremarkable. No obstruction or free air. There are surgical clips the right upper quadrant. Pelvic calcification in the lower left pelvis is consistent with a phlebolith.  IMPRESSION: Overall bowel gas pattern currently unremarkable. Nasogastric tube tip and side port in stomach. Surgical clips in right upper quadrant.   Electronically Signed   By: Lowella Grip M.D.   On: 09/05/2014 09:46   Dg Abd 2 Views  09/04/2014   CLINICAL DATA:  Bowel obstruction with pain  EXAM: ABDOMEN - 2 VIEW  COMPARISON:  September 03, 2014  FINDINGS: Supine and upright abdomen images were obtained. Nasogastric tube tip and side port are in the stomach. There is contrast in the colon. There is no bowel dilatation or air-fluid level to suggest obstruction. There is a paucity of  small bowel gas. No free air. Surgical clips are noted in the right upper quadrant.  IMPRESSION: Nasogastric tube tip and side port in stomach. Paucity of small bowel gas. This finding could be due to resolving obstruction with a degree of underlying ileus or enteritis. Contrast is noted in the large bowel. Air is present in the rectum.   Electronically Signed   By: Lowella Grip M.D.   On: 09/04/2014 08:48    Anti-infectives: Anti-infectives   Start     Dose/Rate Route Frequency Ordered Stop   09/01/14 1700  levofloxacin (LEVAQUIN) IVPB 750 mg     750 mg 100 mL/hr over 90 Minutes Intravenous Every 24 hours 09/01/14 1534        Assessment/Plan: Problem List: Patient Active Problem List   Diagnosis Date Noted  . SBO (small bowel obstruction) 09/01/2014  . Asthma, chronic 09/01/2014  . Leukocytosis 09/01/2014  . Hyperglycemia 09/01/2014  . PVC (premature ventricular contraction) 09/02/2013  . Chest tightness 09/02/2013  . HTN (hypertension) 09/02/2013  . Hypercholesterolemia 09/02/2013    Bile in JP.  Nausea and vomiting this am.  Will check lipase and continue observation.   * No surgery found *    LOS: 4 days   Matt B. Hassell Done, MD, [facility] [phone] beeper [phone]  09/05/2014 10:46 AM

## 2014-09-06 LAB — BASIC METABOLIC PANEL
ANION GAP: 11 (ref 5–15)
BUN: 9 mg/dL (ref 6–23)
CO2: 26 meq/L (ref 19–32)
Calcium: 9 mg/dL (ref 8.4–10.5)
Chloride: 103 mEq/L (ref 96–112)
Creatinine, Ser: 0.76 mg/dL (ref 0.50–1.10)
GFR calc Af Amer: >90 mL/min (ref >90)
GFR, EST NON AFRICAN AMERICAN: 89 mL/min — AB (ref >90)
Glucose, Bld: 122 mg/dL — ABNORMAL HIGH (ref 70–99)
Potassium: 3.9 mEq/L (ref 3.7–5.3)
SODIUM: 140 meq/L (ref 137–147)

## 2014-09-06 MED ORDER — KCL IN DEXTROSE-NACL 20-5-0.45 MEQ/L-%-% IV SOLN
INTRAVENOUS | Status: DC
  Filled 2014-09-06 (×2): qty 1000

## 2014-09-06 MED ORDER — BISACODYL 10 MG RE SUPP
10.0000 mg | Freq: Once | RECTAL | Status: AC
  Administered 2014-09-06: 10 mg via RECTAL
  Filled 2014-09-06: qty 1

## 2014-09-06 MED ORDER — POLYETHYLENE GLYCOL 3350 17 G PO PACK
17.0000 g | PACK | Freq: Once | ORAL | Status: AC
  Administered 2014-09-06: 17 g via ORAL
  Filled 2014-09-06: qty 1

## 2014-09-06 NOTE — Progress Notes (Signed)
Patient ID: Christina Campos, female   DOB: 1951-03-16, 63 y.o.   MRN: [id number]     [facility]      [address] [place], [place] [address]    Phone: [phone] FAX: [phone]     Subjective: Small hard BM yesterday.  No n/v.  NGT out.  Walking.  Passing lots of flatus and feeling much better.   Objective:  Vital signs:  Filed Vitals:   09/05/14 0546 09/05/14 1301 09/05/14 2316 09/06/14 0512  BP: 129/65 128/65 129/80 115/55  Pulse: 75 79 104 81  Temp: 98.1 F (36.7 C) 97.6 F (36.4 C) 99.2 F (37.3 C) 98.2 F (36.8 C)  TempSrc:  Oral Oral   Resp: '16 16 17 17  ' Height:      Weight:      SpO2: 94% 98% 100% 94%    Last BM Date: 09/05/14  Intake/Output   Yesterday:  10/11 0701 - 10/12 0700 In: 1506.3 [I.V.:1506.3] Out: 0  This shift:    I/O last 3 completed shifts: In: 2381.3 [I.V.:2331.3; NG/GT:50] Out: 200 [Emesis/NG output:200]    Physical Exam: General: Pt awake/alert/oriented x4 in no acute distress Abdomen: Soft.  Nondistended.  Mild tenderness to rlq.   No evidence of peritonitis.  No incarcerated hernias.   Problem List:   Principal Problem:   SBO (small bowel obstruction) Active Problems:   HTN (hypertension)   Hypercholesterolemia   Asthma, chronic   Leukocytosis   Hyperglycemia    Results:   Labs: Results for orders placed during the hospital encounter of 09/01/14 (from the past 48 hour(s))  BASIC METABOLIC PANEL     Status: Abnormal   Collection Time    09/05/14  4:33 AM      Result Value Ref Range   Sodium 139  137 - 147 mEq/L   Potassium 4.1  3.7 - 5.3 mEq/L   Comment: HEMOLYSIS AT THIS LEVEL MAY AFFECT RESULT   Chloride 100  96 - 112 mEq/L   CO2 25  19 - 32 mEq/L   Glucose, Bld 111 (*) 70 - 99 mg/dL   BUN 8  6 - 23 mg/dL   Creatinine, Ser 0.76  0.50 - 1.10 mg/dL   Calcium 8.8  8.4 - 10.5 mg/dL   GFR calc non Af Amer 89 (*) >90 mL/min   GFR calc Af Amer >90  >90 mL/min   Comment: (NOTE)     The eGFR has been calculated using the CKD EPI equation.     This calculation has not been validated in all clinical situations.     eGFR's persistently <90 mL/min signify possible Chronic Kidney     Disease.   Anion gap 14  5 - 15  BASIC METABOLIC PANEL     Status: Abnormal   Collection Time    09/06/14  5:50 AM      Result Value Ref Range   Sodium 140  137 - 147 mEq/L   Potassium 3.9  3.7 - 5.3 mEq/L   Chloride 103  96 - 112 mEq/L   CO2 26  19 - 32 mEq/L   Glucose, Bld 122 (*) 70 - 99 mg/dL   BUN 9  6 - 23 mg/dL   Creatinine, Ser 0.76  0.50 - 1.10 mg/dL   Calcium 9.0  8.4 - 10.5 mg/dL   GFR calc non Af Amer 89 (*) >90 mL/min   GFR calc Af Amer >90  >90 mL/min  Comment: (NOTE)     The eGFR has been calculated using the CKD EPI equation.     This calculation has not been validated in all clinical situations.     eGFR's persistently <90 mL/min signify possible Chronic Kidney     Disease.   Anion gap 11  5 - 15    Imaging / Studies: Dg Abd 2 Views  09/05/2014   CLINICAL DATA:  Recent small bowel obstruction  EXAM: ABDOMEN - 2 VIEW  COMPARISON:  CT abdomen and pelvis September 01, 2014; abdominal radiographs September 04, 2014  FINDINGS: Supine and upright images were obtained. Nasogastric tube tip and side port are in the stomach. There is contrast in the colon. Overall bowel gas pattern currently is unremarkable. No obstruction or free air. There are surgical clips the right upper quadrant. Pelvic calcification in the lower left pelvis is consistent with a phlebolith.  IMPRESSION: Overall bowel gas pattern currently unremarkable. Nasogastric tube tip and side port in stomach. Surgical clips in right upper quadrant.   Electronically Signed   By: Lowella Grip M.D.   On: 09/05/2014 09:46    Medications / Allergies:  Scheduled Meds: . bisacodyl  10 mg Rectal Once  . heparin  5,000 Units Subcutaneous 3 times per day  . levofloxacin (LEVAQUIN) IV  750 mg  Intravenous Q24H  . metoprolol  2.5 mg Intravenous 4 times per day  . pantoprazole (PROTONIX) IV  40 mg Intravenous Q24H   Continuous Infusions: . dextrose 5 % and 0.45 % NaCl with KCl 20 mEq/L     PRN Meds:.acetaminophen, acetaminophen, hydrALAZINE, morphine injection, ondansetron (ZOFRAN) IV  Antibiotics: Anti-infectives   Start     Dose/Rate Route Frequency Ordered Stop   09/01/14 1700  levofloxacin (LEVAQUIN) IVPB 750 mg     750 mg 100 mL/hr over 90 Minutes Intravenous Every 24 hours 09/01/14 1534          Assessment/Plan SBO -start clears, advance diet as tolerated -give dulcolax -continue mobilization   Erby Pian, ANP-BC [facility] Pager [phone](7A-4:30P) For consults and floor pages call [phone](7A-4:30P)  09/06/2014 8:48 AM

## 2014-09-06 NOTE — Progress Notes (Signed)
Patient Demographics  Christina Campos, is a 63 y.o. female, DOB - 11-Apr-1951, TOI:[id number]  Admit date - 09/01/2014   Admitting Physician No admitting provider for patient encounter.  Outpatient Primary MD for the patient is Irven Shelling, MD  LOS - 5   Chief Complaint  Patient presents with  . Abdominal Pain        Subjective:   [facility] today has, No headache, No chest pain,  No new weakness tingling or numbness, No Cough - SOB. Mild generalized abdominal pain with nausea but some improvement, minimal flatus.  Assessment & Plan    1. SBO with transition point. Likely due to adhesions from previous bowel surgeriy and hysterectomy, improved after conservative treatment, now passing flatus, NG out. Surgery has started on clear liquid diet. We'll monitor. If stable likely discharge in the morning.    2. Essential hypertension. As needed IV hydralazine.    3. Dyslipidemia. Statin once taking orally.       Code Status: Full  Family Communication: None present  Disposition Plan: Home   Procedures CT abdomen pelvis   Consults [facility]   Medications  Scheduled Meds: . bisacodyl  10 mg Rectal Once  . heparin  5,000 Units Subcutaneous 3 times per day  . levofloxacin (LEVAQUIN) IV  750 mg Intravenous Q24H  . metoprolol  2.5 mg Intravenous 4 times per day  . pantoprazole (PROTONIX) IV  40 mg Intravenous Q24H   Continuous Infusions: . dextrose 5 % and 0.45 % NaCl with KCl 20 mEq/L 75 mL/hr at 09/06/14 0900   PRN Meds:.acetaminophen, acetaminophen, hydrALAZINE, morphine injection, ondansetron (ZOFRAN) IV  DVT Prophylaxis    Heparin   Lab Results  Component Value Date   PLT 226 09/04/2014    Antibiotics     Anti-infectives   Start     Dose/Rate Route Frequency  Ordered Stop   09/01/14 1700  levofloxacin (LEVAQUIN) IVPB 750 mg     750 mg 100 mL/hr over 90 Minutes Intravenous Every 24 hours 09/01/14 1534            Objective:   Filed Vitals:   09/05/14 0546 09/05/14 1301 09/05/14 2316 09/06/14 0512  BP: 129/65 128/65 129/80 115/55  Pulse: 75 79 104 81  Temp: 98.1 F (36.7 C) 97.6 F (36.4 C) 99.2 F (37.3 C) 98.2 F (36.8 C)  TempSrc:  Oral Oral   Resp: 16 16 17 17   Height:      Weight:      SpO2: 94% 98% 100% 94%    Wt Readings from Last 3 Encounters:  09/01/14 90.71 kg (199 lb 15.7 oz)  12/04/13 93.985 kg (207 lb 3.2 oz)  10/20/13 97.886 kg (215 lb 12.8 oz)     Intake/Output Summary (Last 24 hours) at 09/06/14 1005 Last data filed at 09/06/14 0605  Gross per 24 hour  Intake 1506.25 ml  Output      0 ml  Net 1506.25 ml     Physical Exam  Awake Alert, Oriented X 3, No new F.N deficits, Normal affect Accident.AT,PERRAL Supple Neck,No JVD, No cervical lymphadenopathy appriciated.  Symmetrical Chest wall movement, Good air movement bilaterally, CTAB RRR,No Gallops,Rubs or new Murmurs, No Parasternal Heave Hypoactive B.Sounds, Abd Soft, mild diffuse  tenderness, NG-tube in place, No organomegaly appriciated, No rebound - guarding or rigidity. No Cyanosis, Clubbing or edema, No new Rash or bruise      Data Review   Micro Results Recent Results (from the past 240 hour(s))  URINE CULTURE     Status: None   Collection Time    09/01/14  6:23 AM      Result Value Ref Range Status   Specimen Description URINE, CLEAN CATCH   Final   Special Requests NONE   Final   Culture  Setup Time     Final   Value: 09/01/2014 17:12     Performed at [facility]     Final   Value: NO GROWTH     Performed at [facility]   Culture     Final   Value: NO GROWTH     Performed at [facility]   Report Status 09/02/2014 FINAL   Final    Radiology Reports Ct Abdomen Pelvis W Contrast  09/01/2014    CLINICAL DATA:  Initial evaluation for epigastric and lower abdominal pain  EXAM: CT ABDOMEN AND PELVIS WITH CONTRAST  TECHNIQUE: Multidetector CT imaging of the abdomen and pelvis was performed using the standard protocol following bolus administration of intravenous contrast.  CONTRAST:  127mL OMNIPAQUE IOHEXOL 300 MG/ML  SOLN  COMPARISON:  Prior radiograph from earlier the same day.  FINDINGS: Linear opacities within the bilateral lung bases are most consistent with atelectasis. No pleural or pericardial effusion.  The liver demonstrates a normal contrast enhanced appearance. Gallbladder is surgically absent. No biliary dilatation. Spleen, adrenal glands, and pancreas demonstrate a normal contrast enhanced appearance.  Kidneys are equal in size with symmetric enhancement. Subcentimeter hypodensity within the right kidney is too small the characterize by CT, but statistically likely represents a small cyst. No nephrolithiasis, hydronephrosis, or focal enhancing renal mass.  Stomach within normal limits.  Proximal small bowel within normal limits. Multiple prominent fluid-filled loops of small bowel are seen clustered within the right lower quadrant, measuring up to 3 cm in diameter. There is an apparent transition point within the lower mid abdomen (series 2, image 69). There is fecalization of the small bowel just proximal to this. The ileum is decompressed distally. Finding is concerning for small bowel obstruction. Free fluid within the pelvis and lower abdomen is likely reactive in nature.  Colon is decompressed and within normal limits. Scattered diverticula present without acute diverticulitis.  Bladder within normal limits.  Uterus and ovaries are not visualized.  No free intraperitoneal air.  No pathologically enlarged intra-abdominal pelvic lymph nodes. Mild calcified atheromatous disease seen within the infrarenal aorta without aneurysm.  No acute osseous abnormality. No worrisome lytic or blastic  osseous lesions. Prominent bilateral facet arthrosis noted at L5-S1 without pars defect.  IMPRESSION: 1. Multiple mildly dilated fluid-filled loops of small bowel clustered within the right lower quadrant with transition point in the lower mid abdomen. Findings most consistent with small bowel obstruction. Underlying adhesive disease is suspected. 2. Small volume free fluid within the lower abdomen and pelvis, likely reactive in nature. 3. Colonic diverticulosis without acute diverticulitis. 4. Status post cholecystectomy and hysterectomy.   Electronically Signed   By: Jeannine Boga M.D.   On: 09/01/2014 06:54   Dg Abd 2 Views  09/02/2014   CLINICAL DATA:  Small bowel obstruction, abdominal pain  EXAM: ABDOMEN - 2 VIEW  COMPARISON:  09/01/2014  FINDINGS: NG tube coiled within stomach  with tip in proximal stomach. Distended small bowel loops with air-fluid levels are noted and left abdomen consistent with small bowel obstruction.  IMPRESSION: Distended small bowel loops with air-fluid levels in left abdomen consistent with small bowel obstruction. NG tube with tip in proximal stomach.   Electronically Signed   By: Lahoma Crocker M.D.   On: 09/02/2014 09:06   Dg Abd Acute W/chest  09/01/2014   CLINICAL DATA:  Acute onset of epigastric abdominal pain, extending to the umbilicus. Nausea and vomiting. Assess for free intra-abdominal air. Initial encounter.  EXAM: ACUTE ABDOMEN SERIES (ABDOMEN 2 VIEW & CHEST 1 VIEW)  COMPARISON:  Chest radiograph performed 10/15/2013  FINDINGS: The lungs are well-aerated. Mild left basilar atelectasis is noted. There is no evidence of pleural effusion or pneumothorax. The cardiomediastinal silhouette is within normal limits.  The visualized bowel gas pattern is unremarkable. Scattered stool and air are seen within the colon; there is no evidence of small bowel dilatation to suggest obstruction. No free intra-abdominal air is identified on the provided upright view. Clips are  noted within the right upper quadrant, reflecting prior cholecystectomy.  No acute osseous abnormalities are seen; the sacroiliac joints are unremarkable in appearance.  IMPRESSION: 1. Unremarkable bowel gas pattern; no free intra-abdominal air seen. Small to moderate amount of stool noted in the brain 2. Mild left basilar atelectasis noted; lungs otherwise clear.   Electronically Signed   By: Garald Balding M.D.   On: 09/01/2014 05:18     CBC  Recent Labs Lab 09/01/14 0125 09/01/14 1846 09/02/14 0430 09/03/14 0505 09/04/14 0310  WBC 13.2* 10.5 9.1 7.4 6.9  HGB 14.1 12.3 11.9* 11.2* 10.8*  HCT 42.2 37.7 36.8 34.2* 32.6*  PLT 289 247 238 225 226  MCV 89.4 90.8 92.5 91.2 90.1  MCH 29.9 29.6 29.9 29.9 29.8  MCHC 33.4 32.6 32.3 32.7 33.1  RDW 12.8 13.2 13.4 13.0 12.5  LYMPHSABS 1.6  --   --   --   --   MONOABS 0.4  --   --   --   --   EOSABS 0.1  --   --   --   --   BASOSABS 0.0  --   --   --   --     Chemistries   Recent Labs Lab 09/01/14 0125 09/01/14 1846 09/02/14 0430 09/03/14 0505 09/04/14 0310 09/05/14 0433 09/06/14 0550  NA 140  --  142 141 140 139 140  K 4.2  --  4.3 4.3 3.7 4.1 3.9  CL 100  --  105 102 102 100 103  CO2 25  --  27 26 27 25 26   GLUCOSE 150*  --  114* 105* 115* 111* 122*  BUN 15  --  12 9 8 8 9   CREATININE 0.95 0.81 0.87 0.90 0.85 0.76 0.76  CALCIUM 9.9  --  8.4 8.2* 8.6 8.8 9.0  MG  --  2.0  --   --   --   --   --   AST 16  --   --   --   --   --   --   ALT 14  --   --   --   --   --   --   ALKPHOS 139*  --   --   --   --   --   --   BILITOT 0.3  --   --   --   --   --   --    ------------------------------------------------------------------------------------------------------------------  estimated creatinine clearance is 85.3 ml/min (by C-G formula based on Cr of 0.76). ------------------------------------------------------------------------------------------------------------------ No results found for this basename: HGBA1C,  in the last 72  hours ------------------------------------------------------------------------------------------------------------------ No results found for this basename: CHOL, HDL, LDLCALC, TRIG, CHOLHDL, LDLDIRECT,  in the last 72 hours ------------------------------------------------------------------------------------------------------------------ No results found for this basename: TSH, T4TOTAL, FREET3, T3FREE, THYROIDAB,  in the last 72 hours ------------------------------------------------------------------------------------------------------------------ No results found for this basename: VITAMINB12, FOLATE, FERRITIN, TIBC, IRON, RETICCTPCT,  in the last 72 hours  Coagulation profile No results found for this basename: INR, PROTIME,  in the last 168 hours  No results found for this basename: DDIMER,  in the last 72 hours  Cardiac Enzymes No results found for this basename: CK, CKMB, TROPONINI, MYOGLOBIN,  in the last 168 hours ------------------------------------------------------------------------------------------------------------------ No components found with this basename: POCBNP,      Time Spent in minutes  35   Jaice Lague K M.D on 09/06/2014 at 10:05 AM  Between 7am to 7pm - Pager - [phone]  After 7pm go to [email or web address] - password [email or web address]  And look for the night coverage person covering for me after hours  [facility] Office  [phone]

## 2014-09-06 NOTE — Progress Notes (Signed)
General surgery attending:  Patient interviewed and examined. Clinical course and x-rays reviewed. Agree with assessment above.  She is planned passing plenty of flatus but no stool today. She denies pain or nausea. Abdominal exam is essentially benign.  Suspect resolving SBO. The no evidence of bowel compromise Clear liquid diet, advance as tolerated We'll give 1 dose of MiraLAX.   Edsel Petrin. Dalbert Batman, M.D., [facility] General and Minimally invasive Surgery Breast and Colorectal Surgery Office:   [phone] Pager:   [phone]

## 2014-09-07 LAB — BASIC METABOLIC PANEL
ANION GAP: 13 (ref 5–15)
BUN: 8 mg/dL (ref 6–23)
CALCIUM: 8.9 mg/dL (ref 8.4–10.5)
CO2: 25 meq/L (ref 19–32)
Chloride: 103 mEq/L (ref 96–112)
Creatinine, Ser: 0.74 mg/dL (ref 0.50–1.10)
GFR calc Af Amer: >90 mL/min (ref >90)
GFR, EST NON AFRICAN AMERICAN: 90 mL/min — AB (ref >90)
Glucose, Bld: 106 mg/dL — ABNORMAL HIGH (ref 70–99)
Potassium: 5.1 mEq/L (ref 3.7–5.3)
SODIUM: 141 meq/L (ref 137–147)

## 2014-09-07 MED ORDER — ROSUVASTATIN CALCIUM 5 MG PO TABS
5.0000 mg | ORAL_TABLET | Freq: Every day | ORAL | Status: DC
  Filled 2014-09-07: qty 1

## 2014-09-07 MED ORDER — PANTOPRAZOLE SODIUM 40 MG PO TBEC: 40.0000 mg | DELAYED_RELEASE_TABLET | Freq: Every day | ORAL | Status: DC

## 2014-09-07 NOTE — Progress Notes (Signed)
[facility] Progress Note    Subjective: Patient without complaints.  Objective: Vital signs in last 24 hours: Temp:  [97.8 F (36.6 C)-98.3 F (36.8 C)] 98.2 F (36.8 C) (10/13 0614) Pulse Rate:  [76-83] 83 (10/13 0614) Resp:  [18-19] 19 (10/13 0614) BP: (108-134)/(55-62) 108/59 mmHg (10/13 0614) SpO2:  [94 %-100 %] 94 % (10/13 0614) Last BM Date: 09/06/14  Intake/Output from previous day: 10/12 0701 - 10/13 0700 In: 1180 [P.O.:730; I.V.:450] Out: -  Intake/Output this shift:    General: No distress  Lungs: Clear  Abd: Good bowel sounds.  Passing flatus, no bowel movement yet in spite of getting Miralax  Extremities: No clinical signs or symptoms of DVT  Neuro: Intact  Lab Results:  @LABLAST2 (wbc:2,hgb:2,hct:2,plt:2) BMET  Recent Labs  09/06/14 0550 09/07/14 0446  NA 140 141  K 3.9 5.1  CL 103 103  CO2 26 25  GLUCOSE 122* 106*  BUN 9 8  CREATININE 0.76 0.74  CALCIUM 9.0 8.9   PT/INR No results found for this basename: LABPROT, INR,  in the last 72 hours ABG No results found for this basename: PHART, PCO2, PO2, HCO3,  in the last 72 hours  Studies/Results: Dg Abd 2 Views  14-Sep-2014   CLINICAL DATA:  Recent small bowel obstruction  EXAM: ABDOMEN - 2 VIEW  COMPARISON:  CT abdomen and pelvis September 01, 2014; abdominal radiographs September 04, 2014  FINDINGS: Supine and upright images were obtained. Nasogastric tube tip and side port are in the stomach. There is contrast in the colon. Overall bowel gas pattern currently is unremarkable. No obstruction or free air. There are surgical clips the right upper quadrant. Pelvic calcification in the lower left pelvis is consistent with a phlebolith.  IMPRESSION: Overall bowel gas pattern currently unremarkable. Nasogastric tube tip and side port in stomach. Surgical clips in right upper quadrant.   Electronically Signed   By: Lowella Grip M.D.   On: 09/14/14 09:46    Anti-infectives: Anti-infectives   Start      Dose/Rate Route Frequency Ordered Stop   09/01/14 1700  levofloxacin (LEVAQUIN) IVPB 750 mg  Status:  Discontinued     750 mg 100 mL/hr over 90 Minutes Intravenous Every 24 hours 09/01/14 1534 09/07/14 0743      Assessment/Plan: s/p  Advance diet Discharge  LOS: 6 days   Kathryne Eriksson. Dahlia Bailiff, MD, FACS [phone] [facility] 09/07/2014

## 2014-09-07 NOTE — Progress Notes (Signed)
Patient Demographics  Christina Campos, is a 63 y.o. female, DOB - Jun 21, 1951, [id number]  Admit date - 09/01/2014   Admitting Physician No admitting provider for patient encounter.  Outpatient Primary MD for the patient is Irven Shelling, MD  LOS - 6   Chief Complaint  Patient presents with  . Abdominal Pain       Brief summary. Pleasant 63 year old Caucasian female with history of previous hysterectomy and cholecystectomy admitted to the hospital with abdominal pain due to small bowel obstruction, she has done well gradually with conservative management with surgery following. NG tube was removed 2 days ago. She is currently on clear liquid diet passing some flatus. Switch to regular diet by general surgery today. If she remains symptom-free discharge in the morning.   Subjective:   [facility] today has, No headache, No chest pain,  No new weakness tingling or numbness, No Cough - SOB. Mild generalized abdominal pain with nausea but some improvement, minimal flatus.  Assessment & Plan    1. SBO with transition point. Likely due to adhesions from previous bowel surgeriy and hysterectomy, improved after conservative treatment, now passing flatus, NG out. Surgery has started on clear liquid diet plan to advance to regular today. We'll monitor. If stable likely discharge in the morning.    2. Essential hypertension. As needed IV hydralazine.    3. Dyslipidemia. Statin PO       Code Status: Full  Family Communication: None present  Disposition Plan: Home   Procedures CT abdomen pelvis   Consults [facility]   Medications  Scheduled Meds: . heparin  5,000 Units Subcutaneous 3 times per day  . metoprolol  2.5 mg Intravenous 4 times per day  . pantoprazole  40 mg Oral Q1200    Continuous Infusions:   PRN Meds:.acetaminophen, acetaminophen, hydrALAZINE, morphine injection, ondansetron (ZOFRAN) IV  DVT Prophylaxis    Heparin   Lab Results  Component Value Date   PLT 226 09/04/2014    Antibiotics     Anti-infectives   Start     Dose/Rate Route Frequency Ordered Stop   09/01/14 1700  levofloxacin (LEVAQUIN) IVPB 750 mg  Status:  Discontinued     750 mg 100 mL/hr over 90 Minutes Intravenous Every 24 hours 09/01/14 1534 09/07/14 0743          Objective:   Filed Vitals:   09/06/14 1405 09/06/14 1743 09/06/14 2220 09/07/14 0614  BP: 127/58 134/62 127/57 108/59  Pulse: 83 81 76 83  Temp: 98.3 F (36.8 C)  97.8 F (36.6 C) 98.2 F (36.8 C)  TempSrc: Oral  Oral Oral  Resp: 18  18 19   Height:      Weight:      SpO2: 100%  94% 94%    Wt Readings from Last 3 Encounters:  09/01/14 90.71 kg (199 lb 15.7 oz)  12/04/13 93.985 kg (207 lb 3.2 oz)  10/20/13 97.886 kg (215 lb 12.8 oz)     Intake/Output Summary (Last 24 hours) at 09/07/14 1015 Last data filed at 09/07/14 0906  Gross per 24 hour  Intake   1170 ml  Output      0 ml  Net   1170 ml     Physical Exam  Awake  Alert, Oriented X 3, No new F.N deficits, Normal affect [place].AT,PERRAL Supple Neck,No JVD, No cervical lymphadenopathy appriciated.  Symmetrical Chest wall movement, Good air movement bilaterally, CTAB RRR,No Gallops,Rubs or new Murmurs, No Parasternal Heave Hypoactive B.Sounds, Abd Soft, mild diffuse tenderness, NG-tube in place, No organomegaly appriciated, No rebound - guarding or rigidity. No Cyanosis, Clubbing or edema, No new Rash or bruise      Data Review   Micro Results Recent Results (from the past 240 hour(s))  URINE CULTURE     Status: None   Collection Time    09/01/14  6:23 AM      Result Value Ref Range Status   Specimen Description URINE, CLEAN CATCH   Final   Special Requests NONE   Final   Culture  Setup Time     Final   Value: 09/01/2014 17:12      Performed at [place]     Final   Value: NO GROWTH     Performed at [facility]   Culture     Final   Value: NO GROWTH     Performed at [facility]   Report Status 09/02/2014 FINAL   Final    Radiology Reports Ct Abdomen Pelvis W Contrast  09/01/2014   CLINICAL DATA:  Initial evaluation for epigastric and lower abdominal pain  EXAM: CT ABDOMEN AND PELVIS WITH CONTRAST  TECHNIQUE: Multidetector CT imaging of the abdomen and pelvis was performed using the standard protocol following bolus administration of intravenous contrast.  CONTRAST:  155mL OMNIPAQUE IOHEXOL 300 MG/ML  SOLN  COMPARISON:  Prior radiograph from earlier the same day.  FINDINGS: Linear opacities within the bilateral lung bases are most consistent with atelectasis. No pleural or pericardial effusion.  The liver demonstrates a normal contrast enhanced appearance. Gallbladder is surgically absent. No biliary dilatation. Spleen, adrenal glands, and pancreas demonstrate a normal contrast enhanced appearance.  Kidneys are equal in size with symmetric enhancement. Subcentimeter hypodensity within the right kidney is too small the characterize by CT, but statistically likely represents a small cyst. No nephrolithiasis, hydronephrosis, or focal enhancing renal mass.  Stomach within normal limits.  Proximal small bowel within normal limits. Multiple prominent fluid-filled loops of small bowel are seen clustered within the right lower quadrant, measuring up to 3 cm in diameter. There is an apparent transition point within the lower mid abdomen (series 2, image 69). There is fecalization of the small bowel just proximal to this. The ileum is decompressed distally. Finding is concerning for small bowel obstruction. Free fluid within the pelvis and lower abdomen is likely reactive in nature.  Colon is decompressed and within normal limits. Scattered diverticula present without acute diverticulitis.  Bladder  within normal limits.  Uterus and ovaries are not visualized.  No free intraperitoneal air.  No pathologically enlarged intra-abdominal pelvic lymph nodes. Mild calcified atheromatous disease seen within the infrarenal aorta without aneurysm.  No acute osseous abnormality. No worrisome lytic or blastic osseous lesions. Prominent bilateral facet arthrosis noted at L5-S1 without pars defect.  IMPRESSION: 1. Multiple mildly dilated fluid-filled loops of small bowel clustered within the right lower quadrant with transition point in the lower mid abdomen. Findings most consistent with small bowel obstruction. Underlying adhesive disease is suspected. 2. Small volume free fluid within the lower abdomen and pelvis, likely reactive in nature. 3. Colonic diverticulosis without acute diverticulitis. 4. Status post cholecystectomy and hysterectomy.   Electronically Signed   By: Marland Kitchen  Jeannine Boga M.D.   On: 09/01/2014 06:54   Dg Abd 2 Views  09/02/2014   CLINICAL DATA:  Small bowel obstruction, abdominal pain  EXAM: ABDOMEN - 2 VIEW  COMPARISON:  09/01/2014  FINDINGS: NG tube coiled within stomach with tip in proximal stomach. Distended small bowel loops with air-fluid levels are noted and left abdomen consistent with small bowel obstruction.  IMPRESSION: Distended small bowel loops with air-fluid levels in left abdomen consistent with small bowel obstruction. NG tube with tip in proximal stomach.   Electronically Signed   By: Lahoma Crocker M.D.   On: 09/02/2014 09:06   Dg Abd Acute W/chest  09/01/2014   CLINICAL DATA:  Acute onset of epigastric abdominal pain, extending to the umbilicus. Nausea and vomiting. Assess for free intra-abdominal air. Initial encounter.  EXAM: ACUTE ABDOMEN SERIES (ABDOMEN 2 VIEW & CHEST 1 VIEW)  COMPARISON:  Chest radiograph performed 10/15/2013  FINDINGS: The lungs are well-aerated. Mild left basilar atelectasis is noted. There is no evidence of pleural effusion or pneumothorax. The  cardiomediastinal silhouette is within normal limits.  The visualized bowel gas pattern is unremarkable. Scattered stool and air are seen within the colon; there is no evidence of small bowel dilatation to suggest obstruction. No free intra-abdominal air is identified on the provided upright view. Clips are noted within the right upper quadrant, reflecting prior cholecystectomy.  No acute osseous abnormalities are seen; the sacroiliac joints are unremarkable in appearance.  IMPRESSION: 1. Unremarkable bowel gas pattern; no free intra-abdominal air seen. Small to moderate amount of stool noted in the brain 2. Mild left basilar atelectasis noted; lungs otherwise clear.   Electronically Signed   By: Garald Balding M.D.   On: 09/01/2014 05:18     CBC  Recent Labs Lab 09/01/14 0125 09/01/14 1846 09/02/14 0430 09/03/14 0505 09/04/14 0310  WBC 13.2* 10.5 9.1 7.4 6.9  HGB 14.1 12.3 11.9* 11.2* 10.8*  HCT 42.2 37.7 36.8 34.2* 32.6*  PLT 289 247 238 225 226  MCV 89.4 90.8 92.5 91.2 90.1  MCH 29.9 29.6 29.9 29.9 29.8  MCHC 33.4 32.6 32.3 32.7 33.1  RDW 12.8 13.2 13.4 13.0 12.5  LYMPHSABS 1.6  --   --   --   --   MONOABS 0.4  --   --   --   --   EOSABS 0.1  --   --   --   --   BASOSABS 0.0  --   --   --   --     Chemistries   Recent Labs Lab 09/01/14 0125 09/01/14 1846  09/03/14 0505 09/04/14 0310 09/05/14 0433 09/06/14 0550 09/07/14 0446  NA 140  --   < > 141 140 139 140 141  K 4.2  --   < > 4.3 3.7 4.1 3.9 5.1  CL 100  --   < > 102 102 100 103 103  CO2 25  --   < > 26 27 25 26 25   GLUCOSE 150*  --   < > 105* 115* 111* 122* 106*  BUN 15  --   < > 9 8 8 9 8   CREATININE 0.95 0.81  < > 0.90 0.85 0.76 0.76 0.74  CALCIUM 9.9  --   < > 8.2* 8.6 8.8 9.0 8.9  MG  --  2.0  --   --   --   --   --   --   AST 16  --   --   --   --   --   --   --  ALT 14  --   --   --   --   --   --   --   ALKPHOS 139*  --   --   --   --   --   --   --   BILITOT 0.3  --   --   --   --   --   --   --   < >  = values in this interval not displayed. ------------------------------------------------------------------------------------------------------------------ estimated creatinine clearance is 85.3 ml/min (by C-G formula based on Cr of 0.74). ------------------------------------------------------------------------------------------------------------------ No results found for this basename: HGBA1C,  in the last 72 hours ------------------------------------------------------------------------------------------------------------------ No results found for this basename: CHOL, HDL, LDLCALC, TRIG, CHOLHDL, LDLDIRECT,  in the last 72 hours ------------------------------------------------------------------------------------------------------------------ No results found for this basename: TSH, T4TOTAL, FREET3, T3FREE, THYROIDAB,  in the last 72 hours ------------------------------------------------------------------------------------------------------------------ No results found for this basename: VITAMINB12, FOLATE, FERRITIN, TIBC, IRON, RETICCTPCT,  in the last 72 hours  Coagulation profile No results found for this basename: INR, PROTIME,  in the last 168 hours  No results found for this basename: DDIMER,  in the last 72 hours  Cardiac Enzymes No results found for this basename: CK, CKMB, TROPONINI, MYOGLOBIN,  in the last 168 hours ------------------------------------------------------------------------------------------------------------------ No components found with this basename: POCBNP,      Time Spent in minutes  35   Clara Smolen K M.D on 09/07/2014 at 10:15 AM  Between 7am to 7pm - Pager - [phone]  After 7pm go to [email or web address] - password [email or web address]  And look for the night coverage person covering for me after hours  [facility] Office  [phone]

## 2014-09-07 NOTE — Progress Notes (Signed)
Discharge paperwork given to patient. Patient is ready for discharge. No questions expressed by patient.

## 2014-09-07 NOTE — Discharge Summary (Signed)
Christina Campos, is a 63 y.o. female  DOB Mar 31, 1951  MRN [id number].  Admission date:  09/01/2014  Admitting Physician  No admitting provider for patient encounter.  Discharge Date:  09/07/2014   Primary MD  Irven Shelling, MD  Recommendations for primary care physician for things to follow:   Repeat CBC, BMP in a week.   Admission Diagnosis  SBO (small bowel obstruction) [K56.69]   Discharge Diagnosis  SBO (small bowel obstruction) [K56.69]    Principal Problem:   SBO (small bowel obstruction) Active Problems:   HTN (hypertension)   Hypercholesterolemia   Asthma, chronic   Leukocytosis   Hyperglycemia      Past Medical History  Diagnosis Date  . Hypertension   . Hyperlipidemia   . Anxiety   . Diverticulitis   . Rotator cuff tear   . Asthma   . PVC (premature ventricular contraction)   . Abnormal stress echocardiogram     with normal cath - normal coronaries and normal LV function  . SBO (small bowel obstruction) 09/01/2014  . Diabetes mellitus without complication     gestational    Past Surgical History  Procedure Laterality Date  . Abdominal hysterectomy    . Tmj repair    . Cardiac catheterization  November 2014    normal coronaries - normal LV function  . Cholecystectomy N/A 10/16/2013    Procedure: LAPAROSCOPIC CHOLECYSTECTOMY WITH INTRAOPERATIVE CHOLANGIOGRAM;  Surgeon: Rolm Bookbinder, MD;  Location: [facility];  Service: General;  Laterality: N/A;       History of present illness and  Hospital Course:     Kindly see H&P for history of present illness and admission details, please review complete Labs, Consult reports and Test reports for all details in brief  HPI  from the history and physical done on the day of admission   Christina Campos is a 63 y.o. female with pmh significant for HTN,  HLD, asthma, anxiety, hx of cholecystectomy, hysterectomy and diverticulosis; came to ED complaining of nausea, vomiting and abd pain. Patient reports symptoms started approx 12 hours prior to admission and has since then worsen. Patient denies blood in her emesis and denies any new medications or food intake in the last week or so. She reports chills/subjective fever and inability to keep things down (including meds). In ER she was found to have leukocytosis and abnormal CT scan revealing SBO with transition point. Normal LDH and afebrile.     Hospital Course    1. SBO with transition point. Likely due to adhesions from previous bowel surgeriy and hysterectomy, improved after conservative treatment, now passing flatus, NG out. Surgery advanced to regular, tolerated well cleared to discharge per general surgery.   2. Essential hypertension. Commence home regimen   3. Dyslipidemia. Statin PO    Discharge Condition: stable   Follow UP  Follow-up Information   Follow up with Irven Shelling, MD. Schedule an appointment as soon as possible for a visit in 1 week.   Specialty:  Internal Medicine   Contact  information:   [address], [place]  [address] [phone]       Follow up with [facility],MD, MD. Schedule an appointment as soon as possible for a visit in 1 week.   Specialty:  General Surgery        Discharge Instructions  and  Discharge Medications         Medication List         acetaminophen 325 MG tablet  Commonly known as:  TYLENOL  Take 650 mg by mouth every 6 (six) hours as needed.     estrogens (conjugated) 0.625 MG tablet  Commonly known as:  PREMARIN  Take 0.625 mg by mouth daily. Take daily for 21 days then do not take for 7 days.     ibuprofen 200 MG tablet  Commonly known as:  ADVIL,MOTRIN  Take 400 mg by mouth every 6 (six) hours as needed for pain.     metoprolol tartrate 25 MG tablet  Commonly known as:  LOPRESSOR  Take 25  mg by mouth daily.     rosuvastatin 10 MG tablet  Commonly known as:  CRESTOR  Take 5 mg by mouth daily.     valsartan-hydrochlorothiazide 160-12.5 MG per tablet  Commonly known as:  DIOVAN-HCT  Take 1 tablet by mouth daily.          Diet and Activity recommendation: See Discharge Instructions above   Consults obtained - [facility]   Major procedures and Radiology Reports - PLEASE review detailed and final reports for all details, in brief -       Ct Abdomen Pelvis W Contrast  09/01/2014   CLINICAL DATA:  Initial evaluation for epigastric and lower abdominal pain  EXAM: CT ABDOMEN AND PELVIS WITH CONTRAST  TECHNIQUE: Multidetector CT imaging of the abdomen and pelvis was performed using the standard protocol following bolus administration of intravenous contrast.  CONTRAST:  172mL OMNIPAQUE IOHEXOL 300 MG/ML  SOLN  COMPARISON:  Prior radiograph from earlier the same day.  FINDINGS: Linear opacities within the bilateral lung bases are most consistent with atelectasis. No pleural or pericardial effusion.  The liver demonstrates a normal contrast enhanced appearance. Gallbladder is surgically absent. No biliary dilatation. Spleen, adrenal glands, and pancreas demonstrate a normal contrast enhanced appearance.  Kidneys are equal in size with symmetric enhancement. Subcentimeter hypodensity within the right kidney is too small the characterize by CT, but statistically likely represents a small cyst. No nephrolithiasis, hydronephrosis, or focal enhancing renal mass.  Stomach within normal limits.  Proximal small bowel within normal limits. Multiple prominent fluid-filled loops of small bowel are seen clustered within the right lower quadrant, measuring up to 3 cm in diameter. There is an apparent transition point within the lower mid abdomen (series 2, image 69). There is fecalization of the small bowel just proximal to this. The ileum is decompressed distally. Finding is concerning for small bowel  obstruction. Free fluid within the pelvis and lower abdomen is likely reactive in nature.  Colon is decompressed and within normal limits. Scattered diverticula present without acute diverticulitis.  Bladder within normal limits.  Uterus and ovaries are not visualized.  No free intraperitoneal air.  No pathologically enlarged intra-abdominal pelvic lymph nodes. Mild calcified atheromatous disease seen within the infrarenal aorta without aneurysm.  No acute osseous abnormality. No worrisome lytic or blastic osseous lesions. Prominent bilateral facet arthrosis noted at L5-S1 without pars defect.  IMPRESSION: 1. Multiple mildly dilated fluid-filled loops of small bowel clustered within the right  lower quadrant with transition point in the lower mid abdomen. Findings most consistent with small bowel obstruction. Underlying adhesive disease is suspected. 2. Small volume free fluid within the lower abdomen and pelvis, likely reactive in nature. 3. Colonic diverticulosis without acute diverticulitis. 4. Status post cholecystectomy and hysterectomy.   Electronically Signed   By: Jeannine Boga M.D.   On: 09/01/2014 06:54   Dg Abd 2 Views  09/05/2014   CLINICAL DATA:  Recent small bowel obstruction  EXAM: ABDOMEN - 2 VIEW  COMPARISON:  CT abdomen and pelvis September 01, 2014; abdominal radiographs September 04, 2014  FINDINGS: Supine and upright images were obtained. Nasogastric tube tip and side port are in the stomach. There is contrast in the colon. Overall bowel gas pattern currently is unremarkable. No obstruction or free air. There are surgical clips the right upper quadrant. Pelvic calcification in the lower left pelvis is consistent with a phlebolith.  IMPRESSION: Overall bowel gas pattern currently unremarkable. Nasogastric tube tip and side port in stomach. Surgical clips in right upper quadrant.   Electronically Signed   By: Lowella Grip M.D.   On: 09/05/2014 09:46   Dg Abd 2 Views  09/04/2014    CLINICAL DATA:  Bowel obstruction with pain  EXAM: ABDOMEN - 2 VIEW  COMPARISON:  September 03, 2014  FINDINGS: Supine and upright abdomen images were obtained. Nasogastric tube tip and side port are in the stomach. There is contrast in the colon. There is no bowel dilatation or air-fluid level to suggest obstruction. There is a paucity of small bowel gas. No free air. Surgical clips are noted in the right upper quadrant.  IMPRESSION: Nasogastric tube tip and side port in stomach. Paucity of small bowel gas. This finding could be due to resolving obstruction with a degree of underlying ileus or enteritis. Contrast is noted in the large bowel. Air is present in the rectum.   Electronically Signed   By: Lowella Grip M.D.   On: 09/04/2014 08:48   Dg Abd 2 Views  09/03/2014   CLINICAL DATA:  63 year old female with small bowel obstruction. Evaluate evolution of bowel gas pattern.  EXAM: ABDOMEN - 2 VIEW  COMPARISON:  Prior abdominal radiographs 09/02/2014  FINDINGS: Similar position of nasogastric tube coiled within the stomach. Significant interval improvement in mild gaseous distension of the small bowel in the mid abdomen. Gas and stool remain present throughout the entire course of the colon. The retained colonic stool burden is moderate and predominantly in the ascending, transverse and proximal descending colon. No evidence of free air were large volume ascites. Surgical clips in the right upper quadrant suggest prior cholecystectomy. Mild dependent atelectasis in the lower lobes.  IMPRESSION: 1. Improved bowel gas pattern consistent with resolving small bowel obstruction. 2. Unchanged moderate colonic stool burden. 3. Nasogastric tube remains in unchanged position within the stomach.   Electronically Signed   By: Jacqulynn Cadet M.D.   On: 09/03/2014 09:19   Dg Abd 2 Views  09/02/2014   CLINICAL DATA:  Small bowel obstruction, abdominal pain  EXAM: ABDOMEN - 2 VIEW  COMPARISON:  09/01/2014  FINDINGS: NG  tube coiled within stomach with tip in proximal stomach. Distended small bowel loops with air-fluid levels are noted and left abdomen consistent with small bowel obstruction.  IMPRESSION: Distended small bowel loops with air-fluid levels in left abdomen consistent with small bowel obstruction. NG tube with tip in proximal stomach.   Electronically Signed   By: Orlean Bradford.D.  On: 09/02/2014 09:06   Dg Abd Acute W/chest  09/01/2014   CLINICAL DATA:  Acute onset of epigastric abdominal pain, extending to the umbilicus. Nausea and vomiting. Assess for free intra-abdominal air. Initial encounter.  EXAM: ACUTE ABDOMEN SERIES (ABDOMEN 2 VIEW & CHEST 1 VIEW)  COMPARISON:  Chest radiograph performed 10/15/2013  FINDINGS: The lungs are well-aerated. Mild left basilar atelectasis is noted. There is no evidence of pleural effusion or pneumothorax. The cardiomediastinal silhouette is within normal limits.  The visualized bowel gas pattern is unremarkable. Scattered stool and air are seen within the colon; there is no evidence of small bowel dilatation to suggest obstruction. No free intra-abdominal air is identified on the provided upright view. Clips are noted within the right upper quadrant, reflecting prior cholecystectomy.  No acute osseous abnormalities are seen; the sacroiliac joints are unremarkable in appearance.  IMPRESSION: 1. Unremarkable bowel gas pattern; no free intra-abdominal air seen. Small to moderate amount of stool noted in the brain 2. Mild left basilar atelectasis noted; lungs otherwise clear.   Electronically Signed   By: Garald Balding M.D.   On: 09/01/2014 05:18    Micro Results      Recent Results (from the past 240 hour(s))  URINE CULTURE     Status: None   Collection Time    09/01/14  6:23 AM      Result Value Ref Range Status   Specimen Description URINE, CLEAN CATCH   Final   Special Requests NONE   Final   Culture  Setup Time     Final   Value: 09/01/2014 17:12     Performed  at [facility]     Final   Value: NO GROWTH     Performed at [facility]   Culture     Final   Value: NO GROWTH     Performed at [facility]   Report Status 09/02/2014 FINAL   Final       Today   Subjective:   Christina Campos today has no headache,no chest abdominal pain,no new weakness tingling or numbness, feels much better wants to go home today.    Objective:   Blood pressure 108/59, pulse 83, temperature 98.2 F (36.8 C), temperature source Oral, resp. rate 19, height 5\' 7"  (1.702 m), weight 90.71 kg (199 lb 15.7 oz), SpO2 94.00%.   Intake/Output Summary (Last 24 hours) at 09/07/14 1139 Last data filed at 09/07/14 0906  Gross per 24 hour  Intake    720 ml  Output      0 ml  Net    720 ml    Exam Awake Alert, Oriented x 3, No new F.N deficits, Normal affect [place].AT,PERRAL Supple Neck,No JVD, No cervical lymphadenopathy appriciated.  Symmetrical Chest wall movement, Good air movement bilaterally, CTAB RRR,No Gallops,Rubs or new Murmurs, No Parasternal Heave +ve B.Sounds, Abd Soft, Non tender, No organomegaly appriciated, No rebound -guarding or rigidity. No Cyanosis, Clubbing or edema, No new Rash or bruise  Data Review   CBC w Diff: Lab Results  Component Value Date   WBC 6.9 09/04/2014   HGB 10.8* 09/04/2014   HCT 32.6* 09/04/2014   PLT 226 09/04/2014   LYMPHOPCT 12 09/01/2014   MONOPCT 3 09/01/2014   EOSPCT 1 09/01/2014   BASOPCT 0 09/01/2014    CMP: Lab Results  Component Value Date   NA 141 09/07/2014   K 5.1 09/07/2014   CL 103 09/07/2014   CO2  25 09/07/2014   BUN 8 09/07/2014   CREATININE 0.74 09/07/2014   PROT 8.2 09/01/2014   ALBUMIN 4.2 09/01/2014   BILITOT 0.3 09/01/2014   ALKPHOS 139* 09/01/2014   AST 16 09/01/2014   ALT 14 09/01/2014  .   Total Time in preparing paper work, data evaluation and todays exam - 35 minutes  Thurnell Lose M.D on 09/07/2014 at 11:39 AM  [facility]  Group Office  [phone]

## 2014-09-07 NOTE — Discharge Instructions (Signed)
Follow with Primary MD Irven Shelling, MD in 7 days   Get CBC, CMP, 2 view Chest X ray checked  by Primary MD next visit.    Activity: As tolerated with Full fall precautions use walker/cane & assistance as needed   Disposition Home    Diet: Heart Healthy soft  For Heart failure patients - Check your Weight same time everyday, if you gain over 2 pounds, or you develop in leg swelling, experience more shortness of breath or chest pain, call your Primary MD immediately. Follow Cardiac Low Salt Diet and 1.8 lit/day fluid restriction.   On your next visit with her primary care physician please Get Medicines reviewed and adjusted.  Please request your Prim.MD to go over all Hospital Tests and Procedure/Radiological results at the follow up, please get all Hospital records sent to your Prim MD by signing hospital release before you go home.   If you experience worsening of your admission symptoms, develop shortness of breath, life threatening emergency, suicidal or homicidal thoughts you must seek medical attention immediately by calling 911 or calling your MD immediately  if symptoms less severe.  You Must read complete instructions/literature along with all the possible adverse reactions/side effects for all the Medicines you take and that have been prescribed to you. Take any new Medicines after you have completely understood and accpet all the possible adverse reactions/side effects.   Do not drive, operating heavy machinery, perform activities at heights, swimming or participation in water activities or provide baby sitting services if your were admitted for syncope or siezures until you have seen by Primary MD or a Neurologist and advised to do so again.  Do not drive when taking Pain medications.    Do not take more than prescribed Pain, Sleep and Anxiety Medications  Special Instructions: If you have smoked or chewed Tobacco  in the last 2 yrs please stop smoking, stop any  regular Alcohol  and or any Recreational drug use.  Wear Seat belts while driving.   Please note  You were cared for by a hospitalist during your hospital stay. If you have any questions about your discharge medications or the care you received while you were in the hospital after you are discharged, you can call the unit and asked to speak with the hospitalist on call if the hospitalist that took care of you is not available. Once you are discharged, your primary care physician will handle any further medical issues. Please note that NO REFILLS for any discharge medications will be authorized once you are discharged, as it is imperative that you return to your primary care physician (or establish a relationship with a primary care physician if you do not have one) for your aftercare needs so that they can reassess your need for medications and monitor your lab values.  Small Bowel Obstruction A small bowel obstruction is a blockage (obstruction) of the small intestine (small bowel). The small bowel is a long, slender tube that connects the stomach to the colon. Its job is to absorb nutrients from the fluids and foods you consume into the bloodstream.  CAUSES  There are many causes of intestinal blockage. The most common ones include:  Hernias. This is a more common cause in children than adults.  Inflammatory bowel disease (enteritis and colitis).  Twisting of the bowel (volvulus).  Tumors.  Scar tissue (adhesions) from previous surgery or radiation treatment.  Recent surgery. This may cause an acute small bowel obstruction called an ileus. SYMPTOMS  Abdominal pain. This may be dull cramps or sharp pain. It may occur in one area or may be present in the entire abdomen. Pain can range from mild to severe, depending on the degree of obstruction.  Nausea and vomiting. Vomit may be greenish or yellow bile color.  Distended or swollen stomach. Abdominal bloating is a common  symptom.  Constipation.  Lack of passing gas.  Frequent belching.  Diarrhea. This may occur if runny stool is able to leak around the obstruction. DIAGNOSIS  Your caregiver can usually diagnose small bowel obstruction by taking a history, doing a physical exam, and taking X-rays. If the cause is unclear, a CT scan (computerized tomography) of your abdomen and pelvis may be needed. TREATMENT  Treatment of the blockage depends on the cause and how bad the problem is.   Sometimes, the obstruction improves with bed rest and intravenous (IV) fluids.  Resting the bowel is very important. This means following a simple diet. Sometimes, a clear liquid diet may be required for several days.  Sometimes, a small tube (nasogastric tube) is placed into the stomach to decompress the bowel. When the bowel is blocked, it usually swells up like a balloon filled with air and fluids. Decompression means that the air and fluids are removed by suction through that tube. This can help with pain, discomfort, and nausea. It can also help the obstruction resolve faster.  Surgery may be required if other treatments do not work. Bowel obstruction from a hernia may require early surgery and can be an emergency procedure. Adhesions that cause frequent or severe obstructions may also require surgery. HOME CARE INSTRUCTIONS If your bowel obstruction is only partial or incomplete, you may be allowed to go home.  Get plenty of rest.  Follow your diet as directed by your caregiver.  Only consume clear liquids until your condition improves.  Avoid solid foods as instructed. SEEK IMMEDIATE MEDICAL CARE IF:  You have increased pain or cramping.  You vomit blood.  You have uncontrolled vomiting or nausea.  You cannot drink fluids due to vomiting or pain.  You develop confusion.  You begin feeling very dry or thirsty (dehydrated).  You have severe bloating.  You have chills.  You have a fever.  You feel  extremely weak or you faint. MAKE SURE YOU:  Understand these instructions.  Will watch your condition.  Will get help right away if you are not doing well or get worse. Document Released: 01/29/2006 Document Revised: 02/04/2012 Document Reviewed: 01/26/2011 [facility] Patient Information 2015 [place], [place]. This information is not intended to replace advice given to you by your health care provider. Make sure you discuss any questions you have with your health care provider.

## 2014-09-08 NOTE — Progress Notes (Signed)
Seen and agree  

## 2014-11-04 ENCOUNTER — Encounter (HOSPITAL_COMMUNITY): Payer: Self-pay | Admitting: Cardiology

## 2014-12-24 ENCOUNTER — Other Ambulatory Visit: Payer: Self-pay | Admitting: *Deleted

## 2014-12-24 MED ORDER — METOPROLOL TARTRATE 25 MG PO TABS: 25.0000 mg | ORAL_TABLET | Freq: Every day | ORAL | Status: DC

## 2015-01-04 ENCOUNTER — Other Ambulatory Visit: Payer: Self-pay | Admitting: Internal Medicine

## 2015-01-04 ENCOUNTER — Ambulatory Visit
Admission: RE | Admit: 2015-01-04 | Discharge: 2015-01-04 | Disposition: A | Discharge status: Home / Self Care | Payer: 59 | Source: Ambulatory Visit | Attending: Internal Medicine | Admitting: Internal Medicine

## 2015-01-04 DIAGNOSIS — R059 Cough, unspecified: Secondary | ICD-10-CM

## 2015-01-04 DIAGNOSIS — R05 Cough: Secondary | ICD-10-CM

## 2015-02-15 ENCOUNTER — Other Ambulatory Visit: Payer: Self-pay

## 2015-02-15 MED ORDER — METOPROLOL TARTRATE 25 MG PO TABS: 25.0000 mg | ORAL_TABLET | Freq: Every day | ORAL | Status: DC

## 2015-03-18 ENCOUNTER — Other Ambulatory Visit: Payer: Self-pay

## 2015-03-18 MED ORDER — METOPROLOL TARTRATE 25 MG PO TABS: 25.0000 mg | ORAL_TABLET | Freq: Every day | ORAL | Status: DC

## 2015-05-06 ENCOUNTER — Other Ambulatory Visit: Payer: Self-pay | Admitting: Urology

## 2015-05-12 ENCOUNTER — Encounter (HOSPITAL_BASED_OUTPATIENT_CLINIC_OR_DEPARTMENT_OTHER): Payer: Self-pay | Admitting: *Deleted

## 2015-05-16 ENCOUNTER — Encounter (HOSPITAL_BASED_OUTPATIENT_CLINIC_OR_DEPARTMENT_OTHER): Payer: Self-pay | Admitting: *Deleted

## 2015-05-17 ENCOUNTER — Encounter (HOSPITAL_BASED_OUTPATIENT_CLINIC_OR_DEPARTMENT_OTHER): Payer: Self-pay | Admitting: *Deleted

## 2015-05-17 NOTE — Progress Notes (Signed)
NPO AFTER MN.  ARRIVE AT 0800.  NEEDS ISTAT. CURRENT EKG IN CHART AND EPIC.

## 2015-05-18 ENCOUNTER — Ambulatory Visit (HOSPITAL_BASED_OUTPATIENT_CLINIC_OR_DEPARTMENT_OTHER)
Admission: RE | Admit: 2015-05-18 | Discharge: 2015-05-18 | Disposition: A | Discharge status: Home / Self Care | Payer: 59 | Source: Ambulatory Visit | Attending: Urology | Admitting: Urology

## 2015-05-18 ENCOUNTER — Encounter (HOSPITAL_BASED_OUTPATIENT_CLINIC_OR_DEPARTMENT_OTHER)
Admission: RE | Disposition: A | Discharge status: Home / Self Care | Payer: Self-pay | Source: Ambulatory Visit | Attending: Urology

## 2015-05-18 ENCOUNTER — Ambulatory Visit (HOSPITAL_BASED_OUTPATIENT_CLINIC_OR_DEPARTMENT_OTHER): Payer: 59 | Admitting: Anesthesiology

## 2015-05-18 ENCOUNTER — Encounter (HOSPITAL_BASED_OUTPATIENT_CLINIC_OR_DEPARTMENT_OTHER): Payer: Self-pay | Admitting: Anesthesiology

## 2015-05-18 DIAGNOSIS — E785 Hyperlipidemia, unspecified: Secondary | ICD-10-CM | POA: Diagnosis not present

## 2015-05-18 DIAGNOSIS — M1990 Unspecified osteoarthritis, unspecified site: Secondary | ICD-10-CM | POA: Insufficient documentation

## 2015-05-18 DIAGNOSIS — J45909 Unspecified asthma, uncomplicated: Secondary | ICD-10-CM | POA: Insufficient documentation

## 2015-05-18 DIAGNOSIS — Z885 Allergy status to narcotic agent status: Secondary | ICD-10-CM | POA: Diagnosis not present

## 2015-05-18 DIAGNOSIS — I10 Essential (primary) hypertension: Secondary | ICD-10-CM | POA: Insufficient documentation

## 2015-05-18 DIAGNOSIS — Z888 Allergy status to other drugs, medicaments and biological substances status: Secondary | ICD-10-CM | POA: Diagnosis not present

## 2015-05-18 DIAGNOSIS — F419 Anxiety disorder, unspecified: Secondary | ICD-10-CM | POA: Insufficient documentation

## 2015-05-18 DIAGNOSIS — I493 Ventricular premature depolarization: Secondary | ICD-10-CM | POA: Diagnosis not present

## 2015-05-18 DIAGNOSIS — Z87891 Personal history of nicotine dependence: Secondary | ICD-10-CM | POA: Diagnosis not present

## 2015-05-18 DIAGNOSIS — C679 Malignant neoplasm of bladder, unspecified: Secondary | ICD-10-CM | POA: Diagnosis not present

## 2015-05-18 DIAGNOSIS — Z91040 Latex allergy status: Secondary | ICD-10-CM | POA: Insufficient documentation

## 2015-05-18 DIAGNOSIS — N3289 Other specified disorders of bladder: Secondary | ICD-10-CM | POA: Diagnosis present

## 2015-05-18 HISTORY — DX: Personal history of other diseases of the digestive system: Z87.19

## 2015-05-18 HISTORY — DX: Unspecified osteoarthritis, unspecified site: M19.90

## 2015-05-18 HISTORY — DX: Personal history of other medical treatment: Z92.89

## 2015-05-18 HISTORY — PX: CYSTOSCOPY W/ RETROGRADES: SHX1426

## 2015-05-18 HISTORY — DX: Personal history of gestational diabetes: Z86.32

## 2015-05-18 HISTORY — DX: Palpitations: R00.2

## 2015-05-18 HISTORY — PX: URETEROSCOPY: SHX842

## 2015-05-18 HISTORY — PX: TRANSURETHRAL RESECTION OF BLADDER TUMOR WITH GYRUS (TURBT-GYRUS): SHX6458

## 2015-05-18 HISTORY — DX: Other seasonal allergic rhinitis: J30.2

## 2015-05-18 HISTORY — DX: Presence of spectacles and contact lenses: Z97.3

## 2015-05-18 LAB — POCT I-STAT 4, (NA,K, GLUC, HGB,HCT)
Glucose, Bld: 93 mg/dL (ref 65–99)
HEMATOCRIT: 36 % (ref 36.0–46.0)
HEMOGLOBIN: 12.2 g/dL (ref 12.0–15.0)
POTASSIUM: 3.9 mmol/L (ref 3.5–5.1)
SODIUM: 143 mmol/L (ref 135–145)

## 2015-05-18 SURGERY — TRANSURETHRAL RESECTION OF BLADDER TUMOR WITH GYRUS (TURBT-GYRUS)
Anesthesia: General | Site: Ureter

## 2015-05-18 MED ORDER — ONDANSETRON HCL 4 MG/2ML IJ SOLN
INTRAMUSCULAR | Status: DC | PRN
  Administered 2015-05-18: 4 mg via INTRAVENOUS

## 2015-05-18 MED ORDER — DEXAMETHASONE SODIUM PHOSPHATE 4 MG/ML IJ SOLN
INTRAMUSCULAR | Status: DC | PRN
  Administered 2015-05-18: 10 mg via INTRAVENOUS

## 2015-05-18 MED ORDER — OXYCODONE-ACETAMINOPHEN 5-325 MG PO TABS
ORAL_TABLET | ORAL | Status: AC
  Filled 2015-05-18: qty 1

## 2015-05-18 MED ORDER — CEFAZOLIN SODIUM-DEXTROSE 2-3 GM-% IV SOLR
2.0000 g | INTRAVENOUS | Status: AC
  Administered 2015-05-18: 2 g via INTRAVENOUS
  Filled 2015-05-18: qty 50

## 2015-05-18 MED ORDER — OXYBUTYNIN CHLORIDE ER 10 MG PO TB24
ORAL_TABLET | ORAL | Status: AC
  Filled 2015-05-18: qty 1

## 2015-05-18 MED ORDER — OXYCODONE-ACETAMINOPHEN 5-325 MG PO TABS
1.0000 | ORAL_TABLET | Freq: Once | ORAL | Status: AC
  Administered 2015-05-18: 1 via ORAL
  Filled 2015-05-18: qty 1

## 2015-05-18 MED ORDER — OXYBUTYNIN CHLORIDE ER 10 MG PO TB24
10.0000 mg | ORAL_TABLET | Freq: Every day | ORAL | Status: DC
  Administered 2015-05-18: 10 mg via ORAL
  Filled 2015-05-18: qty 1

## 2015-05-18 MED ORDER — PROPOFOL 10 MG/ML IV BOLUS
INTRAVENOUS | Status: DC | PRN
  Administered 2015-05-18: 170 mg via INTRAVENOUS

## 2015-05-18 MED ORDER — PROMETHAZINE HCL 25 MG/ML IJ SOLN
6.2500 mg | INTRAMUSCULAR | Status: DC | PRN
  Filled 2015-05-18: qty 1

## 2015-05-18 MED ORDER — SODIUM CHLORIDE 0.9 % IR SOLN
Status: DC | PRN
  Administered 2015-05-18: 6000 mL

## 2015-05-18 MED ORDER — LACTATED RINGERS IV SOLN
INTRAVENOUS | Status: DC
  Administered 2015-05-18: 09:00:00 via INTRAVENOUS
  Filled 2015-05-18: qty 1000

## 2015-05-18 MED ORDER — MIDAZOLAM HCL 2 MG/2ML IJ SOLN
INTRAMUSCULAR | Status: AC
  Filled 2015-05-18: qty 2

## 2015-05-18 MED ORDER — OXYBUTYNIN CHLORIDE ER 10 MG PO TB24: 10.0000 mg | ORAL_TABLET | Freq: Every day | ORAL | Status: DC

## 2015-05-18 MED ORDER — CEFAZOLIN SODIUM 1-5 GM-% IV SOLN
1.0000 g | INTRAVENOUS | Status: DC
  Filled 2015-05-18: qty 50

## 2015-05-18 MED ORDER — KETOROLAC TROMETHAMINE 30 MG/ML IJ SOLN
INTRAMUSCULAR | Status: DC | PRN
  Administered 2015-05-18: 30 mg via INTRAVENOUS

## 2015-05-18 MED ORDER — FENTANYL CITRATE (PF) 100 MCG/2ML IJ SOLN
INTRAMUSCULAR | Status: AC
  Filled 2015-05-18: qty 2

## 2015-05-18 MED ORDER — LIDOCAINE HCL (CARDIAC) 20 MG/ML IV SOLN
INTRAVENOUS | Status: DC | PRN
  Administered 2015-05-18: 60 mg via INTRAVENOUS

## 2015-05-18 MED ORDER — OXYCODONE-ACETAMINOPHEN 5-325 MG PO TABS: 1.0000 | ORAL_TABLET | ORAL | Status: DC | PRN

## 2015-05-18 MED ORDER — CEFAZOLIN SODIUM-DEXTROSE 2-3 GM-% IV SOLR
INTRAVENOUS | Status: AC
Start: 2015-05-18 — End: 2015-05-18
  Filled 2015-05-18: qty 50

## 2015-05-18 MED ORDER — FENTANYL CITRATE (PF) 100 MCG/2ML IJ SOLN
25.0000 ug | INTRAMUSCULAR | Status: DC | PRN
  Administered 2015-05-18: 50 ug via INTRAVENOUS
  Filled 2015-05-18: qty 1

## 2015-05-18 MED ORDER — FENTANYL CITRATE (PF) 100 MCG/2ML IJ SOLN
INTRAMUSCULAR | Status: AC
  Filled 2015-05-18: qty 4

## 2015-05-18 MED ORDER — MIDAZOLAM HCL 5 MG/5ML IJ SOLN
INTRAMUSCULAR | Status: DC | PRN
  Administered 2015-05-18: 2 mg via INTRAVENOUS

## 2015-05-18 MED ORDER — FENTANYL CITRATE (PF) 100 MCG/2ML IJ SOLN
INTRAMUSCULAR | Status: DC | PRN
  Administered 2015-05-18 (×2): 25 ug via INTRAVENOUS
  Administered 2015-05-18 (×3): 50 ug via INTRAVENOUS

## 2015-05-18 MED ORDER — IOHEXOL 350 MG/ML SOLN
INTRAVENOUS | Status: DC | PRN
  Administered 2015-05-18: 8 mL

## 2015-05-18 SURGICAL SUPPLY — 44 items
BAG DRN ANRFLXCHMBR STRAP LEK (BAG)
BAG URINE DRAINAGE (UROLOGICAL SUPPLIES) IMPLANT
BAG URINE LEG 19OZ MD ST LTX (BAG) IMPLANT
BAG URINE LEG 500ML (DRAIN) IMPLANT
BAG URO CATCHER STRL LF (DRAPE) ×4 IMPLANT
BASKET LASER NITINOL 1.9FR (BASKET) IMPLANT
BASKET STONE 1.7 NGAGE (UROLOGICAL SUPPLIES) IMPLANT
BASKET ZERO TIP NITINOL 2.4FR (BASKET) IMPLANT
BSKT STON RTRVL 120 1.9FR (BASKET)
BSKT STON RTRVL ZERO TP 2.4FR (BASKET)
CANISTER SUCT LVC 12 LTR MEDI- (MISCELLANEOUS) IMPLANT
CATH FOLEY 2WAY SLVR  5CC 18FR (CATHETERS) ×2
CATH FOLEY 2WAY SLVR  5CC 22FR (CATHETERS)
CATH FOLEY 2WAY SLVR 30CC 20FR (CATHETERS) IMPLANT
CATH FOLEY 2WAY SLVR 5CC 18FR (CATHETERS) IMPLANT
CATH FOLEY 2WAY SLVR 5CC 22FR (CATHETERS) IMPLANT
CATH INTERMIT  6FR 70CM (CATHETERS) IMPLANT
CLOTH BEACON ORANGE TIMEOUT ST (SAFETY) ×4 IMPLANT
ELECT LOOP 22F BIPOLAR SML (ELECTROSURGICAL) ×4
ELECT REM PT RETURN 9FT ADLT (ELECTROSURGICAL) ×4
ELECTRODE LOOP 22F BIPOLAR SML (ELECTROSURGICAL) IMPLANT
ELECTRODE REM PT RTRN 9FT ADLT (ELECTROSURGICAL) ×2 IMPLANT
EVACUATOR MICROVAS BLADDER (UROLOGICAL SUPPLIES) IMPLANT
FIBER LASER FLEXIVA 365 (UROLOGICAL SUPPLIES) IMPLANT
FIBER LASER TRAC TIP (UROLOGICAL SUPPLIES) IMPLANT
GLOVE BIO SURGEON STRL SZ8 (GLOVE) ×2 IMPLANT
GLOVE BIOGEL PI IND STRL 7.0 (GLOVE) IMPLANT
GLOVE BIOGEL PI INDICATOR 7.0 (GLOVE) ×2
GLOVE SURG SS PI 6.5 STRL IVOR (GLOVE) ×2 IMPLANT
GLOVE SURG SS PI 8.0 STRL IVOR (GLOVE) ×2 IMPLANT
GOWN STRL REUS W/ TWL LRG LVL3 (GOWN DISPOSABLE) ×2 IMPLANT
GOWN STRL REUS W/ TWL XL LVL3 (GOWN DISPOSABLE) ×2 IMPLANT
GOWN STRL REUS W/TWL LRG LVL3 (GOWN DISPOSABLE) ×4
GOWN STRL REUS W/TWL XL LVL3 (GOWN DISPOSABLE) ×4
GUIDEWIRE ANG ZIPWIRE 038X150 (WIRE) ×4 IMPLANT
GUIDEWIRE STR DUAL SENSOR (WIRE) ×2 IMPLANT
IV NS IRRIG 3000ML ARTHROMATIC (IV SOLUTION) ×4 IMPLANT
MANIFOLD NEPTUNE II (INSTRUMENTS) ×2 IMPLANT
PACK CYSTO (CUSTOM PROCEDURE TRAY) ×4 IMPLANT
SET ASPIRATION TUBING (TUBING) IMPLANT
STENT URET 6FRX26 CONTOUR (STENTS) ×2 IMPLANT
SYRINGE 10CC LL (SYRINGE) ×4 IMPLANT
SYRINGE IRR TOOMEY STRL 70CC (SYRINGE) IMPLANT
TUBE FEEDING 8FR 16IN STR KANG (MISCELLANEOUS) IMPLANT

## 2015-05-18 NOTE — Transfer of Care (Signed)
Immediate Anesthesia Transfer of Care Note  Patient: Christina Campos  Procedure(s) Performed: Procedure(s) (LRB): TRANSURETHRAL RESECTION OF BLADDER TUMOR WITH GYRUS (TURBT-GYRUS) (N/A)  ATTEMPTED URETEROSCOPY LEFT (Bilateral) CYSTOSCOPY WITH  BILATERAL RETROGRADE PYELOGRAM (Bilateral)  Patient Location: PACU  Anesthesia Type: General  Level of Consciousness: awake, oriented, sedated and patient cooperative  Airway & Oxygen Therapy: Patient Spontanous Breathing and Patient connected to face mask oxygen  Post-op Assessment: Report given to PACU RN and Post -op Vital signs reviewed and stable  Post vital signs: Reviewed and stable  Complications: No apparent anesthesia complications

## 2015-05-18 NOTE — Anesthesia Preprocedure Evaluation (Addendum)
Anesthesia Evaluation  Patient identified by MRN, date of birth, ID band Patient awake    Reviewed: Allergy & Precautions, NPO status , Patient's Chart, lab work & pertinent test results  Airway Mallampati: II  TM Distance: >3 FB Neck ROM: Full    Dental no notable dental hx.    Pulmonary asthma , former smoker,  breath sounds clear to auscultation  Pulmonary exam normal       Cardiovascular Exercise Tolerance: Good hypertension, Pt. on medications and Pt. on home beta blockers Normal cardiovascular examRhythm:Regular Rate:Normal  H/O abnormal stress ECHO in 2014. Heart catheterization showed normal coronary arteries.  H/O PVCs   Neuro/Psych Anxiety negative neurological ROS     GI/Hepatic negative GI ROS, Neg liver ROS,   Endo/Other  negative endocrine ROS  Renal/GU negative Renal ROS  negative genitourinary   Musculoskeletal  (+) Arthritis -,   Abdominal   Peds negative pediatric ROS (+)  Hematology negative hematology ROS (+)   Anesthesia Other Findings   Reproductive/Obstetrics negative OB ROS                           Anesthesia Physical Anesthesia Plan  ASA: II  Anesthesia Plan: General   Post-op Pain Management:    Induction: Intravenous  Airway Management Planned: LMA  Additional Equipment:   Intra-op Plan:   Post-operative Plan: Extubation in OR  Informed Consent: I have reviewed the patients History and Physical, chart, labs and discussed the procedure including the risks, benefits and alternatives for the proposed anesthesia with the patient or authorized representative who has indicated his/her understanding and acceptance.   Dental advisory given  Plan Discussed with: CRNA  Anesthesia Plan Comments:         Anesthesia Quick Evaluation

## 2015-05-18 NOTE — Brief Op Note (Addendum)
05/18/2015  10:43 AM  PATIENT:  Christina Campos  64 y.o. female  PRE-OPERATIVE DIAGNOSIS:  BLADDER MASS  POST-OPERATIVE DIAGNOSIS:  BLADDER MASS  PROCEDURE:  Procedure(s): TRANSURETHRAL RESECTION OF BLADDER TUMOR WITH GYRUS (TURBT-GYRUS) (N/A)  ATTEMPTED URETEROSCOPY LEFT (Bilateral) CYSTOSCOPY WITH  BILATTERAL RETROGRADE PYELOGRAM (Bilateral)  SURGEON:  Surgeon(s) and Role:    * Cleon Gustin, MD - Primary  PHYSICIAN ASSISTANT:   ASSISTANTS: none   ANESTHESIA:   general  EBL:  Total I/O In: 200 [I.V.:200] Out: -   BLOOD ADMINISTERED:none  DRAINS: Urinary Catheter (Foley) , left 6x26 JJ ureteral stent  LOCAL MEDICATIONS USED:  NONE  SPECIMEN:  Excision bladder tumor  DISPOSITION OF SPECIMEN:  PATHOLOGY  COUNTS:  YES  TOURNIQUET:  * No tourniquets in log *  DICTATION: .Other Dictation: Dictation Number [id number]  PLAN OF CARE: Discharge to home after PACU  PATIENT DISPOSITION:  PACU - hemodynamically stable.   Delay start of Pharmacological VTE agent (>24hrs) due to surgical blood loss or risk of bleeding: not applicable

## 2015-05-18 NOTE — Discharge Instructions (Signed)
[facility] [phone] Post Ureteroscopy With or Without Stent Instructions  Definitions:  Ureter: The duct that transports urine from the kidney to the bladder. Stent:   A plastic hollow tube that is placed into the ureter, from the kidney to the                 bladder to prevent the ureter from swelling shut.  GENERAL INSTRUCTIONS:  Despite the fact that no skin incisions were used, the area around the ureter and bladder is raw and irritated. The stent is a foreign body which will further irritate the bladder wall. This irritation is manifested by increased frequency of urination, both day and night, and by an increase in the urge to urinate. In some, the urge to urinate is present almost always. Sometimes the urge is strong enough that you may not be able to stop yourself from urinating. The only real cure is to remove the stent and then give time for the bladder wall to heal which can't be done until the danger of the ureter swelling shut has passed, which varies.  You may see some blood in your urine while the stent is in place and a few days afterwards. Do not be alarmed, even if the urine was clear for a while. Get off your feet and drink lots of fluids until clearing occurs. If you start to pass clots or don't improve, call us.  DIET: You may return to your normal diet immediately. Because of the raw surface of your bladder, alcohol, spicy foods, acid type foods and drinks with caffeine may cause irritation or frequency and should be used in moderation. To keep your urine flowing freely and to avoid constipation, drink plenty of fluids during the day ( 8-10 glasses ). Tip: Avoid cranberry juice because it is very acidic.  ACTIVITY: Your physical activity doesn't need to be restricted. However, if you are very active, you may see some blood in your urine. We suggest that you reduce your activity under these circumstances until the bleeding has stopped.  BOWELS: It is  important to keep your bowels regular during the postoperative period. Straining with bowel movements can cause bleeding. A bowel movement every other day is reasonable. Use a mild laxative if needed, such as Milk of Magnesia 2-3 tablespoons, or 2 Dulcolax tablets. Call if you continue to have problems. If you have been taking narcotics for pain, before, during or after your surgery, you may be constipated. Take a laxative if necessary.   MEDICATION: You should resume your pre-surgery medications unless told not to. In addition you will often be given an antibiotic to prevent infection. These should be taken as prescribed until the bottles are finished unless you are having an unusual reaction to one of the drugs.  PROBLEMS YOU SHOULD REPORT TO [place]:  Fevers over 100.5 Fahrenheit.  Heavy bleeding, or clots ( See above notes about blood in urine ).  Inability to urinate.  Drug reactions ( hives, rash, nausea, vomiting, diarrhea ).  Severe burning or pain with urination that is not improving.  FOLLOW-UP: You will need a follow-up appointment to monitor your progress. Call for this appointment at the number listed above. Usually the first appointment will be about three to fourteen days after your surgery.  Indwelling Urinary Catheter Care You have been given a flexible tube (catheter) used to drain the bladder. Catheters are often used when a person has difficulty urinating due to blockage, bleeding, infection, or inability to  control bladder or bowel movements (incontinence). A catheter requires daily care to prevent infection and blockage. HOME CARE INSTRUCTIONS  Do the following to reduce the risk of infection. Antibiotic medicines cannot prevent infections. Limit the number of bacteria entering your bladder  Wash your hands for 2 minutes with soapy water before and after handling the catheter.  Wash your bottom and the entire catheter twice daily, as well as after each bowel movement.  Wash the tip of the penis or just above the vaginal opening with soap and warm water, rinse, and then wash the rectal area. Always wash from front to back.  When changing from the leg bag to overnight bag or from the overnight bag to leg bag, thoroughly clean the end of the catheter where it connects to the tubing with an alcohol wipe.  Clean the leg bag and overnight bag daily after use. Replace your drainage bags weekly.  Always keep the tubing and bag below the level of your bladder. This allows your urine to drain properly. Lifting the bag or tubing above the level of your bladder will cause dirty urine to flow back into your bladder. If you must briefly lift the bag higher than your bladder, pinch the catheter or tubing to prevent backflow.  Drink enough water and fluids to keep your urine clear or pale yellow, or as directed by your caregiver. This will flush bacteria out of the bladder. Protect tissues from injury  Attach the catheter to your leg so there is no tension on the catheter. Use adhesive tape or a leg strap. If you are using adhesive tape, remove any sticky residue left behind by the previous tape you used.  Place your leg bag on your lower leg. Fasten the straps securely and comfortably.  Do not remove the catheter yourself unless you have been instructed how to do so. Keep the urinary pathway open  Check throughout the day to be sure your catheter is working and urine is draining freely. Make sure the tubing does not become kinked.  Do not let the drainage bag overfill. SEEK IMMEDIATE MEDICAL CARE IF:   The catheter becomes blocked. Urine is not draining.  Urine is leaking.  You have any pain.  You have a fever. Document Released: 11/12/2005 Document Revised: 10/29/2012 Document Reviewed: 04/13/2010 [facility] Patient Information 2015 [place], [place]. This information is not intended to replace advice given to you by your health care provider. Make sure you discuss  any questions you have with your health care provider.       Post Anesthesia Home Care Instructions  Activity: Get plenty of rest for the remainder of the day. A responsible adult should stay with you for 24 hours following the procedure.  For the next 24 hours, DO NOT: -Drive a car -Paediatric nurse -Drink alcoholic beverages -Take any medication unless instructed by your physician -Make any legal decisions or sign important papers.  Meals: Start with liquid foods such as gelatin or soup. Progress to regular foods as tolerated. Avoid greasy, spicy, heavy foods. If nausea and/or vomiting occur, drink only clear liquids until the nausea and/or vomiting subsides. Call your physician if vomiting continues.  Special Instructions/Symptoms: Your throat may feel dry or sore from the anesthesia or the breathing tube placed in your throat during surgery. If this causes discomfort, gargle with warm salt water. The discomfort should disappear within 24 hours.  If you had a scopolamine patch placed behind your ear for the management of post- operative nausea  and/or vomiting:  1. The medication in the patch is effective for 72 hours, after which it should be removed.  Wrap patch in a tissue and discard in the trash. Wash hands thoroughly with soap and water. 2. You may remove the patch earlier than 72 hours if you experience unpleasant side effects which may include dry mouth, dizziness or visual disturbances. 3. Avoid touching the patch. Wash your hands with soap and water after contact with the patch.

## 2015-05-18 NOTE — Anesthesia Postprocedure Evaluation (Signed)
  Anesthesia Post-op Note  Patient: Christina Campos  Procedure(s) Performed: Procedure(s) (LRB): TRANSURETHRAL RESECTION OF BLADDER TUMOR WITH GYRUS (TURBT-GYRUS) (N/A)  ATTEMPTED URETEROSCOPY LEFT (Bilateral) CYSTOSCOPY WITH  BILATERAL RETROGRADE PYELOGRAM (Bilateral)  Patient Location: PACU  Anesthesia Type: General  Level of Consciousness: awake and alert   Airway and Oxygen Therapy: Patient Spontanous Breathing  Post-op Pain: mild  Post-op Assessment: Post-op Vital signs reviewed, Patient's Cardiovascular Status Stable, Respiratory Function Stable, Patent Airway and No signs of Nausea or vomiting  Last Vitals:  Filed Vitals:   05/18/15 1145  BP: 127/61  Pulse: 70  Temp:   Resp: 13    Post-op Vital Signs: stable   Complications: No apparent anesthesia complications

## 2015-05-18 NOTE — Anesthesia Procedure Notes (Signed)
Procedure Name: LMA Insertion Date/Time: 05/18/2015 9:53 AM Performed by: Denna Haggard D Pre-anesthesia Checklist: Patient identified, Emergency Drugs available, Suction available and Patient being monitored Patient Re-evaluated:Patient Re-evaluated prior to inductionOxygen Delivery Method: Circle System Utilized Preoxygenation: Pre-oxygenation with 100% oxygen Intubation Type: IV induction Ventilation: Mask ventilation without difficulty LMA: LMA inserted LMA Size: 4.0 Number of attempts: 1 Airway Equipment and Method: Bite block Placement Confirmation: positive ETCO2 Tube secured with: Tape Dental Injury: Teeth and Oropharynx as per pre-operative assessment

## 2015-05-19 ENCOUNTER — Encounter (HOSPITAL_BASED_OUTPATIENT_CLINIC_OR_DEPARTMENT_OTHER): Payer: Self-pay | Admitting: Urology

## 2015-05-19 NOTE — H&P (Signed)
Urology Admission H&P  Chief Complaint: gross hematuria  History of Present Illness: Christina Campos is a 64yo with a hx of gross hematuria who was found to have a bladder tumor on office cystoscopy. She had multiple episodes of gross painless hematuria. She also has LUTS. She denies fevers/chills/sweats.  Past Medical History  Diagnosis Date  . Hypertension   . Hyperlipidemia   . Anxiety   . PVC (premature ventricular contraction)   . History of exercise stress test     Abnormal stress ehco  09-23-2013---  negative ischemia w/  no ST changes,  after exercise there was hypokinesis in the mid/distal lateral wall and apex consistent with ischemia Positive stress echo  . History of diverticulitis of colon     Aug 2011--  resolved no surgical intervention  . History of small bowel obstruction     09-01-2014--  per CT adhesions--  resolved without surgical intervention  . Seasonal allergic rhinitis   . History of gestational diabetes   . Bladder mass   . Palpitations   . Hematuria   . OA (osteoarthritis)   . Wears glasses    Past Surgical History  Procedure Laterality Date  . Cholecystectomy N/A 10/16/2013    Procedure: LAPAROSCOPIC CHOLECYSTECTOMY WITH INTRAOPERATIVE CHOLANGIOGRAM;  Surgeon: Rolm Bookbinder, MD;  Location: [facility];  Service: General;  Laterality: N/A;  . Left heart catheterization with coronary angiogram N/A 10/07/2013    Procedure: LEFT HEART CATHETERIZATION WITH CORONARY ANGIOGRAM;  Surgeon: Peter M Martinique, MD;  Location: [facility] CATH LAB;  Service: Cardiovascular;  Laterality: N/A;  Normal coronary arteries;  Normal LV function, ef 55-65%  . Temporomandibular joint surgery  1987  . Total abdominal hysterectomy  06-17-2003    w/ Right Ovarian Cystectomy and McCall Culdoplasty  . Colonoscopy  11-09-2010  . Transurethral resection of bladder tumor with gyrus (turbt-gyrus) N/A 05/18/2015    Procedure: TRANSURETHRAL RESECTION OF BLADDER TUMOR WITH GYRUS (TURBT-GYRUS);  Surgeon:  Cleon Gustin, MD;  Location: [facility];  Service: Urology;  Laterality: N/A;  . Ureteroscopy Bilateral 05/18/2015    Procedure:  ATTEMPTED URETEROSCOPY LEFT;  Surgeon: Cleon Gustin, MD;  Location: [facility];  Service: Urology;  Laterality: Bilateral;  . Cystoscopy w/ retrogrades Bilateral 05/18/2015    Procedure: CYSTOSCOPY WITH  BILATERAL RETROGRADE PYELOGRAM;  Surgeon: Cleon Gustin, MD;  Location[facility];  Service: Urology;  Laterality: Bilateral;    Home Medications:  No prescriptions prior to admission   Allergies:  Allergies  Allergen Reactions  . Codeine Shortness Of Breath and Swelling  . Latex Itching  . Lipitor [Atorvastatin] Other (See Comments)    confusion  . Lisinopril Other (See Comments)    cough  . Wellbutrin [Bupropion] Other (See Comments)    Worsened anxiety  . Zocor [Simvastatin] Other (See Comments)    Confusion/ sleepy  . Zoloft [Sertraline] Other (See Comments)    Worsened anxiety    Family History  Problem Relation Age of Onset  . Arrhythmia Brother   . Arrhythmia Brother   . Heart disease Father   . Hypertension Father   . Heart attack Father   . Diabetes Mother   . Diabetes Maternal Grandfather   . Diabetes Paternal Grandmother    Social History:  reports that she quit smoking about 21 years ago. Her smoking use included Cigarettes. She has a 35 pack-year smoking history. She has never used smokeless tobacco. She reports that she drinks about 1.2 oz  of alcohol per week. She reports that she does not use illicit drugs.  Review of Systems  All other systems reviewed and are negative.   Physical Exam:  Vital signs in last 24 hours:   Physical Exam  Constitutional: She is oriented to person, place, and time. She appears well-developed and well-nourished.  HENT:  Head: Normocephalic and atraumatic.  Eyes: EOM are normal. Pupils are equal, round, and reactive to light.  Neck:  Normal range of motion. Neck supple.  Cardiovascular: Normal rate and regular rhythm.   Respiratory: Effort normal and breath sounds normal.  GI: Soft. She exhibits no distension.  Musculoskeletal: Normal range of motion.  Neurological: She is alert and oriented to person, place, and time.  Skin: Skin is warm and dry.  Psychiatric: She has a normal mood and affect. Her behavior is normal. Judgment and thought content normal.    Laboratory Data:  No results found for this or any previous visit (from the past 24 hour(s)). No results found for this or any previous visit (from the past 240 hour(s)). Creatinine: No results for input(s): CREATININE in the last 168 hours.  Impression/Assessment:  Bladder tumor  Plan:  Risks/benefits/alternatives to TURBT was explained to the patient and she understands and wishes to proceed with surgery  Saunders Arlington L 05/19/2015, 4:07 PM

## 2015-05-20 ENCOUNTER — Other Ambulatory Visit: Payer: Self-pay

## 2015-05-20 MED ORDER — METOPROLOL TARTRATE 25 MG PO TABS: 25.0000 mg | ORAL_TABLET | Freq: Every day | ORAL | Status: DC

## 2015-06-01 NOTE — Op Note (Signed)
Preoperative diagnosis: Bladder Tumor  Postop diagnosis: Same  Procedure: 1.  Cystoscopy 2. Bilateral retrograde pyelography 3. Intra-operative fluoroscopy, under 1 hour, with interpretation 4.Transurethral resection of bladder tumor, medium 5. Left 6x26 JJ Stent Placement   6. Left diagnostic ureteroscopy   Attending: Nicolette Bang  Anesthesia: General  Estimated blood loss: 5 cc  Drains: 1. 18 French Foley catheter 2. Left 6x26 JJ Stent  Specimens: Bladder tumor  Antibiotics: Ancef  Findings: 2 papillary bladder tumors on the right lateral wall and anterior bladder wall. Normal right retrograde pyelogram. Filling defect in the left renal pelvis. No hydrondpehrosis  Indications: Patient is a 64 year old with a history of  bladder tumor found on office cystoscopy for a workup of gross hematuria.   After discussing treatment options patient decided to proceed with transurethral resection of bladder tumor  Procedure in detail: Prior to procedure consetn was obtained. Patient was brought to the operating room and briefing was done sure correct patient, correct procedure, correct site.  General anesthesia was in administered patient was placed in the dorsal lithotomy position.  The rigid 79 French cystoscope was passed urethra and bladder.  Bladder was inspected masses or lesions and we noted a diffuse centimeter lesion on the right lateral wall as well as a 3 cm lesion in the anterior wall of the bladder.  We then cannulated the right ureteral orifice with a 6 French ureteral catheter.  A gentle retrograde was obtained in findings noted above.  We then turned our attention to the left ureteral orifice.  The left ureteral orifice was cannulated with a 6 French ureteral catheter.  A gentle retrograde was obtained in findings noted above.  We then placed a zip wire through the ureteral catheter and advanced up to the renal pelvis. We attmepted ureteroscopy but the caliber of the ureter was  too small to accomodate the semirgid ureteroscope. We then placed a 6 x 26 double-J ureteral stent over the wire.  We then removed the wire and good coil was noted in the pelvis under fluoroscopy in the bladder under direct vision.  Removed the cystoscope and placed a 1 French resectoscope in the bladder.  Using bipolar electrocautery were then removed the 2 bladder tumors.  We removed the bladder tumors down to the base exposing muscle.  We then removed the pieces and sent them for pathology.  To obtain hemostasis we then cauterized the bed of the tumors.  Once good hemostasis was noted the bladder was then drained and a 18 French Foley catheter was placed. This concluded the procedure which resulted by the patient.  Complications: None  Condition: Stable,  extubated, transferred to PACU.  Plan: Patient is to be discharged home. She is to followup in 5 days for voiding trial and pathology discussion

## 2015-06-05 ENCOUNTER — Other Ambulatory Visit: Payer: Self-pay | Admitting: Nurse Practitioner

## 2015-06-06 ENCOUNTER — Other Ambulatory Visit: Payer: Self-pay

## 2015-06-06 MED ORDER — METOPROLOL TARTRATE 25 MG PO TABS: 25.0000 mg | ORAL_TABLET | Freq: Every day | ORAL | Status: DC

## 2015-06-20 ENCOUNTER — Other Ambulatory Visit: Payer: Self-pay | Admitting: Nurse Practitioner

## 2015-06-20 DIAGNOSIS — I493 Ventricular premature depolarization: Secondary | ICD-10-CM

## 2015-06-22 NOTE — Telephone Encounter (Signed)
Refill sent in for 1 month supply. Needs FU appt arranged. Richardson Dopp, PA-C   06/22/2015 1:19 PM

## 2015-07-25 ENCOUNTER — Other Ambulatory Visit: Payer: Self-pay | Admitting: Physician Assistant

## 2015-07-26 NOTE — Telephone Encounter (Signed)
Patient has not been seen since 2014. She has had multiple notifications to call and schedule an appointment. Please advise. Thanks, MI

## 2015-07-27 ENCOUNTER — Telehealth: Payer: Self-pay

## 2015-07-27 NOTE — Telephone Encounter (Signed)
Patient called not at home spoke to husband.I received a message from refill pool.Patient needs refill,last office visit 10/20/13.Advised to have wife call me 07/28/15 to schedule follow up appointment with Dr.Jordan.I will need to schedule appointment before I can refill medication.

## 2015-09-19 ENCOUNTER — Other Ambulatory Visit: Payer: Self-pay | Admitting: Physician Assistant

## 2015-09-20 NOTE — Telephone Encounter (Signed)
Please advise on refill as patient has still failed to schedule an appointment. Thanks, MI 

## 2015-09-20 NOTE — Telephone Encounter (Signed)
Patient called not at home.Spoke to husband advised to have patient return my call.She needs refill for metoprolol.Last appointment 10/20/13 will need to schedule appointment.

## 2015-09-28 NOTE — Telephone Encounter (Signed)
Patient called no answer.Left message on personal voice mail I need to schedule you appointment before I can refill medications.Advised to call me back.

## 2016-01-04 ENCOUNTER — Other Ambulatory Visit: Payer: Self-pay | Admitting: Orthopedic Surgery

## 2016-01-04 DIAGNOSIS — G8929 Other chronic pain: Secondary | ICD-10-CM

## 2016-01-04 DIAGNOSIS — M533 Sacrococcygeal disorders, not elsewhere classified: Principal | ICD-10-CM

## 2016-03-30 ENCOUNTER — Other Ambulatory Visit: Payer: 59

## 2017-05-02 ENCOUNTER — Other Ambulatory Visit: Payer: Self-pay | Admitting: Obstetrics and Gynecology

## 2017-05-02 DIAGNOSIS — N63 Unspecified lump in unspecified breast: Secondary | ICD-10-CM

## 2017-05-02 DIAGNOSIS — R921 Mammographic calcification found on diagnostic imaging of breast: Secondary | ICD-10-CM

## 2017-07-12 ENCOUNTER — Ambulatory Visit
Admission: RE | Admit: 2017-07-12 | Discharge: 2017-07-12 | Disposition: A | Discharge status: Home / Self Care | Payer: BLUE CROSS/BLUE SHIELD | Source: Ambulatory Visit | Attending: Obstetrics and Gynecology | Admitting: Obstetrics and Gynecology

## 2017-07-12 ENCOUNTER — Other Ambulatory Visit: Payer: Self-pay | Admitting: Obstetrics and Gynecology

## 2017-07-12 ENCOUNTER — Ambulatory Visit
Admission: RE | Admit: 2017-07-12 | Discharge: 2017-07-12 | Disposition: A | Discharge status: Home / Self Care | Payer: 59 | Source: Ambulatory Visit | Attending: Obstetrics and Gynecology | Admitting: Obstetrics and Gynecology

## 2017-07-12 DIAGNOSIS — N63 Unspecified lump in unspecified breast: Secondary | ICD-10-CM

## 2017-07-12 DIAGNOSIS — R921 Mammographic calcification found on diagnostic imaging of breast: Secondary | ICD-10-CM

## 2017-07-18 ENCOUNTER — Other Ambulatory Visit: Payer: Self-pay | Admitting: Obstetrics and Gynecology

## 2017-07-18 DIAGNOSIS — R921 Mammographic calcification found on diagnostic imaging of breast: Secondary | ICD-10-CM

## 2017-07-18 DIAGNOSIS — N63 Unspecified lump in unspecified breast: Secondary | ICD-10-CM

## 2017-07-19 ENCOUNTER — Ambulatory Visit
Admission: RE | Admit: 2017-07-19 | Discharge: 2017-07-19 | Disposition: A | Discharge status: Home / Self Care | Payer: BLUE CROSS/BLUE SHIELD | Source: Ambulatory Visit | Attending: Obstetrics and Gynecology | Admitting: Obstetrics and Gynecology

## 2017-07-19 DIAGNOSIS — R921 Mammographic calcification found on diagnostic imaging of breast: Secondary | ICD-10-CM

## 2017-07-19 DIAGNOSIS — N63 Unspecified lump in unspecified breast: Secondary | ICD-10-CM

## 2017-10-28 ENCOUNTER — Other Ambulatory Visit: Payer: Self-pay | Admitting: Urology

## 2017-11-14 ENCOUNTER — Encounter (HOSPITAL_BASED_OUTPATIENT_CLINIC_OR_DEPARTMENT_OTHER): Payer: Self-pay | Admitting: *Deleted

## 2017-11-15 ENCOUNTER — Encounter (HOSPITAL_BASED_OUTPATIENT_CLINIC_OR_DEPARTMENT_OTHER): Payer: Self-pay | Admitting: *Deleted

## 2017-11-15 ENCOUNTER — Other Ambulatory Visit: Payer: Self-pay

## 2017-11-15 NOTE — Progress Notes (Signed)
SPOKE W/ PT VIA PHONE FOR PRE-OP INTERVIEW.   NPO AFTER MN W/ EXCEPTION CLEAR LIQUIDS UNTIL 0600 (NO CREAM [employer]ODUCTS).  ARRIVE AT 1000.  NEEDS HG.

## 2017-11-22 ENCOUNTER — Other Ambulatory Visit: Payer: Self-pay

## 2017-11-22 ENCOUNTER — Ambulatory Visit (HOSPITAL_BASED_OUTPATIENT_CLINIC_OR_DEPARTMENT_OTHER): Payer: BLUE CROSS/BLUE SHIELD | Admitting: Anesthesiology

## 2017-11-22 ENCOUNTER — Encounter (HOSPITAL_BASED_OUTPATIENT_CLINIC_OR_DEPARTMENT_OTHER)
Admission: RE | Disposition: A | Discharge status: Home / Self Care | Payer: Self-pay | Source: Ambulatory Visit | Attending: Urology

## 2017-11-22 ENCOUNTER — Encounter (HOSPITAL_BASED_OUTPATIENT_CLINIC_OR_DEPARTMENT_OTHER): Payer: Self-pay | Admitting: *Deleted

## 2017-11-22 ENCOUNTER — Ambulatory Visit (HOSPITAL_BASED_OUTPATIENT_CLINIC_OR_DEPARTMENT_OTHER)
Admission: RE | Admit: 2017-11-22 | Discharge: 2017-11-22 | Disposition: A | Discharge status: Home / Self Care | Payer: BLUE CROSS/BLUE SHIELD | Source: Ambulatory Visit | Attending: Urology | Admitting: Urology

## 2017-11-22 DIAGNOSIS — E785 Hyperlipidemia, unspecified: Secondary | ICD-10-CM | POA: Diagnosis not present

## 2017-11-22 DIAGNOSIS — K56609 Unspecified intestinal obstruction, unspecified as to partial versus complete obstruction: Secondary | ICD-10-CM | POA: Diagnosis not present

## 2017-11-22 DIAGNOSIS — Z79899 Other long term (current) drug therapy: Secondary | ICD-10-CM | POA: Diagnosis not present

## 2017-11-22 DIAGNOSIS — F419 Anxiety disorder, unspecified: Secondary | ICD-10-CM | POA: Diagnosis not present

## 2017-11-22 DIAGNOSIS — I10 Essential (primary) hypertension: Secondary | ICD-10-CM | POA: Diagnosis not present

## 2017-11-22 DIAGNOSIS — I493 Ventricular premature depolarization: Secondary | ICD-10-CM | POA: Insufficient documentation

## 2017-11-22 DIAGNOSIS — C676 Malignant neoplasm of ureteric orifice: Secondary | ICD-10-CM | POA: Insufficient documentation

## 2017-11-22 DIAGNOSIS — M1990 Unspecified osteoarthritis, unspecified site: Secondary | ICD-10-CM | POA: Diagnosis not present

## 2017-11-22 DIAGNOSIS — D494 Neoplasm of unspecified behavior of bladder: Secondary | ICD-10-CM | POA: Diagnosis not present

## 2017-11-22 DIAGNOSIS — Z87891 Personal history of nicotine dependence: Secondary | ICD-10-CM | POA: Insufficient documentation

## 2017-11-22 HISTORY — DX: Malignant neoplasm of bladder, unspecified: C67.9

## 2017-11-22 HISTORY — PX: CYSTOSCOPY W/ RETROGRADES: SHX1426

## 2017-11-22 HISTORY — DX: Personal history of other diseases of the circulatory system: Z86.79

## 2017-11-22 HISTORY — PX: TRANSURETHRAL RESECTION OF BLADDER TUMOR: SHX2575

## 2017-11-22 SURGERY — TURBT (TRANSURETHRAL RESECTION OF BLADDER TUMOR)
Anesthesia: General | Site: Renal

## 2017-11-22 MED ORDER — FENTANYL CITRATE (PF) 100 MCG/2ML IJ SOLN
INTRAMUSCULAR | Status: AC
  Filled 2017-11-22: qty 2

## 2017-11-22 MED ORDER — FENTANYL CITRATE (PF) 100 MCG/2ML IJ SOLN
INTRAMUSCULAR | Status: DC | PRN
  Administered 2017-11-22: 50 ug via INTRAVENOUS

## 2017-11-22 MED ORDER — MEPERIDINE HCL 25 MG/ML IJ SOLN
6.2500 mg | INTRAMUSCULAR | Status: DC | PRN
  Filled 2017-11-22: qty 1

## 2017-11-22 MED ORDER — KETOROLAC TROMETHAMINE 30 MG/ML IJ SOLN
INTRAMUSCULAR | Status: AC
  Filled 2017-11-22: qty 1

## 2017-11-22 MED ORDER — TRAMADOL HCL 50 MG PO TABS: 50.0000 mg | ORAL_TABLET | Freq: Four times a day (QID) | ORAL | 0 refills | Status: AC | PRN

## 2017-11-22 MED ORDER — ONDANSETRON HCL 4 MG/2ML IJ SOLN
INTRAMUSCULAR | Status: DC | PRN
  Administered 2017-11-22: 4 mg via INTRAVENOUS

## 2017-11-22 MED ORDER — DEXAMETHASONE SODIUM PHOSPHATE 10 MG/ML IJ SOLN
INTRAMUSCULAR | Status: DC | PRN
  Administered 2017-11-22: 10 mg via INTRAVENOUS

## 2017-11-22 MED ORDER — STERILE WATER FOR INJECTION IJ SOLN
INTRAMUSCULAR | Status: DC | PRN
  Administered 2017-11-22: 3000 mL

## 2017-11-22 MED ORDER — DEXAMETHASONE SODIUM PHOSPHATE 10 MG/ML IJ SOLN
INTRAMUSCULAR | Status: AC
  Filled 2017-11-22: qty 1

## 2017-11-22 MED ORDER — ONDANSETRON HCL 4 MG/2ML IJ SOLN
INTRAMUSCULAR | Status: AC
  Filled 2017-11-22: qty 2

## 2017-11-22 MED ORDER — LIDOCAINE 2% (20 MG/ML) 5 ML SYRINGE
INTRAMUSCULAR | Status: DC | PRN
  Administered 2017-11-22: 10 mg via INTRAVENOUS

## 2017-11-22 MED ORDER — CEFAZOLIN SODIUM-DEXTROSE 2-4 GM/100ML-% IV SOLN
INTRAVENOUS | Status: AC
  Filled 2017-11-22: qty 100

## 2017-11-22 MED ORDER — MIDAZOLAM HCL 2 MG/2ML IJ SOLN
0.5000 mg | Freq: Once | INTRAMUSCULAR | Status: DC | PRN
  Filled 2017-11-22: qty 2

## 2017-11-22 MED ORDER — FENTANYL CITRATE (PF) 100 MCG/2ML IJ SOLN
25.0000 ug | INTRAMUSCULAR | Status: DC | PRN
  Filled 2017-11-22: qty 1

## 2017-11-22 MED ORDER — MIDAZOLAM HCL 2 MG/2ML IJ SOLN
INTRAMUSCULAR | Status: DC | PRN
  Administered 2017-11-22: 2 mg via INTRAVENOUS

## 2017-11-22 MED ORDER — KETOROLAC TROMETHAMINE 30 MG/ML IJ SOLN
INTRAMUSCULAR | Status: DC | PRN
  Administered 2017-11-22: 30 mg via INTRAVENOUS

## 2017-11-22 MED ORDER — LACTATED RINGERS IV SOLN
INTRAVENOUS | Status: DC
  Administered 2017-11-22 (×2): via INTRAVENOUS
  Filled 2017-11-22: qty 1000

## 2017-11-22 MED ORDER — PROPOFOL 10 MG/ML IV BOLUS
INTRAVENOUS | Status: AC
  Filled 2017-11-22: qty 40

## 2017-11-22 MED ORDER — MIDAZOLAM HCL 2 MG/2ML IJ SOLN
INTRAMUSCULAR | Status: AC
  Filled 2017-11-22: qty 2

## 2017-11-22 MED ORDER — SCOPOLAMINE 1 MG/3DAYS TD PT72
MEDICATED_PATCH | TRANSDERMAL | Status: DC | PRN
  Administered 2017-11-22: 1 via TRANSDERMAL

## 2017-11-22 MED ORDER — PROMETHAZINE HCL 25 MG/ML IJ SOLN
6.2500 mg | INTRAMUSCULAR | Status: DC | PRN
  Filled 2017-11-22: qty 1

## 2017-11-22 MED ORDER — IOHEXOL 300 MG/ML  SOLN
INTRAMUSCULAR | Status: DC | PRN
  Administered 2017-11-22: 10 mL

## 2017-11-22 MED ORDER — CEFAZOLIN SODIUM-DEXTROSE 2-4 GM/100ML-% IV SOLN
2.0000 g | INTRAVENOUS | Status: AC
  Administered 2017-11-22: 2 g via INTRAVENOUS
  Filled 2017-11-22: qty 100

## 2017-11-22 MED ORDER — LIDOCAINE 2% (20 MG/ML) 5 ML SYRINGE
INTRAMUSCULAR | Status: AC
  Filled 2017-11-22: qty 10

## 2017-11-22 MED ORDER — PROPOFOL 10 MG/ML IV BOLUS
INTRAVENOUS | Status: DC | PRN
  Administered 2017-11-22: 200 mg via INTRAVENOUS

## 2017-11-22 SURGICAL SUPPLY — 32 items
BAG DRAIN URO-CYSTO SKYTR STRL (DRAIN) ×6 IMPLANT
BAG DRN ANRFLXCHMBR STRAP LEK (BAG)
BAG DRN UROCATH (DRAIN) ×2
BAG URINE DRAINAGE (UROLOGICAL SUPPLIES) IMPLANT
BAG URINE LEG 19OZ MD ST LTX (BAG) IMPLANT
CATH FOLEY 3WAY 30CC 22F (CATHETERS) ×2 IMPLANT
CATH INTERMIT  6FR 70CM (CATHETERS) IMPLANT
CLOTH BEACON ORANGE TIMEOUT ST (SAFETY) ×4 IMPLANT
ELECT REM PT RETURN 9FT ADLT (ELECTROSURGICAL)
ELECTRODE REM PT RTRN 9FT ADLT (ELECTROSURGICAL) ×2 IMPLANT
EVACUATOR MICROVAS BLADDER (UROLOGICAL SUPPLIES) IMPLANT
GLOVE BIO SURGEON STRL SZ8 (GLOVE) ×2 IMPLANT
GLOVE BIOGEL PI IND STRL 6.5 (GLOVE) IMPLANT
GLOVE BIOGEL PI INDICATOR 6.5 (GLOVE) ×4
GLOVE SURG SS PI 6.5 STRL IVOR (GLOVE) ×2 IMPLANT
GLOVE SURG SS PI 8.0 STRL IVOR (GLOVE) ×2 IMPLANT
GOWN STRL REUS W/TWL LRG LVL3 (GOWN DISPOSABLE) ×4 IMPLANT
GOWN STRL REUS W/TWL XL LVL3 (GOWN DISPOSABLE) ×4 IMPLANT
GUIDEWIRE ANG ZIPWIRE 038X150 (WIRE) ×4 IMPLANT
GUIDEWIRE STR DUAL SENSOR (WIRE) IMPLANT
INFUSOR MANOMETER BAG 3000ML (MISCELLANEOUS) ×4 IMPLANT
IV NS IRRIG 3000ML ARTHROMATIC (IV SOLUTION) ×8 IMPLANT
KIT RM TURNOVER CYSTO AR (KITS) ×4 IMPLANT
MANIFOLD NEPTUNE II (INSTRUMENTS) ×2 IMPLANT
NS IRRIG 500ML POUR BTL (IV SOLUTION) ×4 IMPLANT
PACK CYSTO (CUSTOM PROCEDURE TRAY) ×4 IMPLANT
PLUG CATH AND CAP STER (CATHETERS) IMPLANT
SYR 30ML LL (SYRINGE) ×4 IMPLANT
SYRINGE 10CC LL (SYRINGE) ×4 IMPLANT
SYRINGE IRR TOOMEY STRL 70CC (SYRINGE) IMPLANT
TUBE CONNECTING 12'X1/4 (SUCTIONS)
TUBE CONNECTING 12X1/4 (SUCTIONS) IMPLANT

## 2017-11-22 NOTE — Op Note (Signed)
.  Preoperative diagnosis: bladder tumor  Postoperative diagnosis: Same  Procedure: 1 cystoscopy 2. bilateral retrograde pyelography 3.  Intraoperative fluoroscopy, under one hour, with interpretation 4. Transurethral resection of bladder tumor, small  Attending: Rosie Fate  Anesthesia: General  Estimated blood loss: Minimal  Drains: none  Specimens: right lateral wall bladder tumor  Antibiotics: ancef  Findings:  0.5cm papillary right lateral wall tumor.  Ureteral orifices in normal anatomic location. No hydronephrosis or filling defects in either collecting system  Indications: Patient is a 66 year old female with a history of bladder tumor found on office cystoscopy.  After discussing treatment options, they decided proceed with transurethral resection of a bladder tumor.  Procedure her in detail: The patient was brought to the operating room and a brief timeout was done to ensure correct patient, correct procedure, correct site.  General anesthesia was administered patient was placed in dorsal lithotomy position.  Their genitalia was then prepped and draped in usual sterile fashion.  A rigid 93 French cystoscope was passed in the urethra and the bladder.  Bladder was inspected and we noted a  0.5cm bladder tumor.  the ureteral orifices were in the normal orthotopic locations.  a 6 french ureteral catheter was then instilled into the left ureteral orifice.  a gentle retrograde was obtained and findings noted above. We then turned our attention to the right side. a 6 french ureteral catheter was then instilled into the right ureteral orifice.  a gentle retrograde was obtained and findings noted above. We then removed the cystoscope and placed a resectoscope into the bladder. Using the  Cold cup biopsy forceps the bladder tumor was removed. We then used a bugbee to obtain hemostasis.  We then re-inspected the bladder and found no residula bleeding.  the bladder was then drained and  this concluded the procedure which was well tolerated by patient.  Complications: None  Condition: Stable, extubated, transferred to PACU  Plan: Patient is to be discharged home and followup in 5 days for pathology discussion.

## 2017-11-22 NOTE — Transfer of Care (Signed)
Immediate Anesthesia Transfer of Care Note  Patient: Christina Campos  Procedure(s) Performed: TRANSURETHRAL RESECTION OF BLADDER TUMOR (TURBT) (N/A ) CYSTOSCOPY WITH RETROGRADE PYELOGRAM (Bilateral Renal)  Patient Location: PACU  Anesthesia Type:General  Level of Consciousness: awake, alert , oriented and patient cooperative  Airway & Oxygen Therapy: Patient Spontanous Breathing and Patient connected to nasal cannula oxygen  Post-op Assessment: Report given to RN and Post -op Vital signs reviewed and stable  Post vital signs: Reviewed and stable  Last Vitals:  Vitals:   11/22/17 1005  BP: (!) 146/74  Pulse: 67  Resp: 16  Temp: 36.8 C  SpO2: 97%    Last Pain:  Vitals:   11/22/17 1021  TempSrc:   PainSc: 3       Patients Stated Pain Goal: 4 (36/64/40 3474)  Complications: No apparent anesthesia complications

## 2017-11-22 NOTE — Anesthesia Postprocedure Evaluation (Signed)
Anesthesia Post Note  Patient: Christina Campos  Procedure(s) Performed: TRANSURETHRAL RESECTION OF BLADDER TUMOR (TURBT) (N/A Bladder) CYSTOSCOPY WITH RETROGRADE PYELOGRAM (Bilateral Renal)     Patient location during evaluation: PACU Anesthesia Type: General Level of consciousness: awake and alert, patient cooperative and oriented Pain management: pain level controlled Vital Signs Assessment: post-procedure vital signs reviewed and stable Respiratory status: spontaneous breathing, nonlabored ventilation and respiratory function stable Cardiovascular status: blood pressure returned to baseline and stable Postop Assessment: no apparent nausea or vomiting Anesthetic complications: no    Last Vitals:  Vitals:   11/22/17 1400 11/22/17 1445  BP: (!) 148/88 (!) 146/73  Pulse: 74 70  Resp: 17 16  Temp:  36.6 C  SpO2: 97% 100%    Last Pain:  Vitals:   11/22/17 1021  TempSrc:   PainSc: 3                  Jylian Pappalardo,E. Rabon Scholle

## 2017-11-22 NOTE — H&P (Signed)
Urology Admission H&P  Chief Complaint: bladder tumor  History of Present Illness: Ms Sachdev is a 66yo with a hx of low grade bladder cancer who had a tumor recurrance found on office cystoscopy  Past Medical History:  Diagnosis Date  . Anxiety   . Bladder cancer [facility]) urologist-  dr Alyson Ingles   dx 06/ 2016  . History of diverticulitis of colon    Aug 2011--  resolved no surgical intervention  . History of exercise stress test    Abnormal stress ehco  09-23-2013---  negative ischemia w/  no ST changes,  after exercise there was hypokinesis in the mid/distal lateral wall and apex consistent with ischemia Positive stress echo  . History of gestational diabetes   . History of hypertension    PT CHANGED STRESSFUL JOB AND HYPERTENSION RESOLVED , NO MEDICATION SINCE 07/ 2018 APPROX.  Marland Kitchen History of small bowel obstruction    09-01-2014--  per CT adhesions--  resolved without surgical intervention  . Hyperlipidemia   . OA (osteoarthritis)   . Palpitations   . PVC (premature ventricular contraction)   . Seasonal allergic rhinitis   . Wears glasses    Past Surgical History:  Procedure Laterality Date  . BREAST BIOPSY    . CHOLECYSTECTOMY N/A 10/16/2013   Procedure: LAPAROSCOPIC CHOLECYSTECTOMY WITH INTRAOPERATIVE CHOLANGIOGRAM;  Surgeon: Rolm Bookbinder, MD;  Location: [place];  Service: General;  Laterality: N/A;  . COLONOSCOPY  11-09-2010  . CYSTOSCOPY W/ RETROGRADES Bilateral 05/18/2015   Procedure: CYSTOSCOPY WITH  BILATERAL RETROGRADE PYELOGRAM;  Surgeon: Cleon Gustin, MD;  Location: [facility];  Service: Urology;  Laterality: Bilateral;  . LEFT HEART CATHETERIZATION WITH CORONARY ANGIOGRAM N/A 10/07/2013   Procedure: LEFT HEART CATHETERIZATION WITH CORONARY ANGIOGRAM;  Surgeon: Peter M Martinique, MD;  Location: [facility] CATH LAB;  Service: Cardiovascular;  Laterality: N/A;  Normal coronary arteries;  Normal LV function, ef 55-65%  . [place]   . TOTAL ABDOMINAL HYSTERECTOMY  06-17-2003   w/ Right Ovarian Cystectomy and McCall Culdoplasty  . TRANSURETHRAL RESECTION OF BLADDER TUMOR WITH GYRUS (TURBT-GYRUS) N/A 05/18/2015   Procedure: TRANSURETHRAL RESECTION OF BLADDER TUMOR WITH GYRUS (TURBT-GYRUS);  Surgeon: Cleon Gustin, MD;  Location: [facility];  Service: Urology;  Laterality: N/A;  . URETEROSCOPY Bilateral 05/18/2015   Procedure:  ATTEMPTED URETEROSCOPY LEFT;  Surgeon: Cleon Gustin, MD;  Location: [facility];  Service: Urology;  Laterality: Bilateral;    Home Medications:  Current Facility-Administered Medications  Medication Dose Route Frequency Provider Last Rate Last Dose  . ceFAZolin (ANCEF) IVPB 2g/100 mL premix  2 g Intravenous 30 min Pre-Op Nyzier Boivin, Candee Furbish, MD      . lactated ringers infusion   Intravenous Continuous Audry Pili, MD 50 mL/hr at 11/22/17 1049     Allergies:  Allergies  Allergen Reactions  . Codeine Shortness Of Breath and Swelling  . Latex Itching  . Lipitor [Atorvastatin] Other (See Comments)    confusion  . Lisinopril Other (See Comments)    cough  . Wellbutrin [Bupropion] Other (See Comments)    Worsened anxiety  . Zocor [Simvastatin] Other (See Comments)    Confusion/ sleepy  . Zoloft [Sertraline] Other (See Comments)    Worsened anxiety    Family History  Problem Relation Age of Onset  . Arrhythmia Brother   . Arrhythmia Brother   . Heart disease Father   . Hypertension Father   . Heart attack Father   .  Diabetes Mother   . Diabetes Maternal Grandfather   . Diabetes Paternal Grandmother   . Breast cancer Maternal Aunt    Social History:  reports that she quit smoking about 23 years ago. Her smoking use included cigarettes. She has a 35.00 pack-year smoking history. she has never used smokeless tobacco. She reports that she drinks about 1.2 oz of alcohol per week. She reports that she does not use drugs.  Review of Systems  All  other systems reviewed and are negative.   Physical Exam:  Vital signs in last 24 hours: Temp:  [98.2 F (36.8 C)] 98.2 F (36.8 C) (12/28 1005) Pulse Rate:  [67] 67 (12/28 1005) Resp:  [16] 16 (12/28 1005) BP: (146)/(74) 146/74 (12/28 1005) SpO2:  [97 %] 97 % (12/28 1005) Weight:  [93.7 kg (206 lb 8 oz)] 93.7 kg (206 lb 8 oz) (12/28 1005) Physical Exam  Constitutional: She is oriented to person, place, and time. She appears well-developed and well-nourished.  HENT:  Head: Normocephalic and atraumatic.  Eyes: EOM are normal. Pupils are equal, round, and reactive to light.  Neck: Normal range of motion. No thyromegaly present.  Cardiovascular: Normal rate and regular rhythm.  Respiratory: Effort normal. No respiratory distress.  GI: Soft. She exhibits no distension.  Musculoskeletal: Normal range of motion. She exhibits no edema.  Neurological: She is alert and oriented to person, place, and time.  Skin: Skin is warm and dry.  Psychiatric: She has a normal mood and affect. Her behavior is normal. Judgment and thought content normal.    Laboratory Data:  No results found for this or any previous visit (from the past 24 hour(s)). No results found for this or any previous visit (from the past 240 hour(s)). Creatinine: No results for input(s): CREATININE in the last 168 hours. Baseline Creatinine: unknwon  Impression/Assessment:  66yo with bladder tumor  Plan:  The risks/benefits/alternatives to TURBT was explained to the patient and she understands and wishes to proceed with surgery  Nicolette Bang 11/22/2017, 12:27 PM

## 2017-11-22 NOTE — Anesthesia Preprocedure Evaluation (Addendum)
Anesthesia Evaluation  Patient identified by MRN, date of birth, ID band Patient awake    Reviewed: Allergy & Precautions, NPO status , Patient's Chart, lab work & pertinent test results  History of Anesthesia Complications Negative for: history of anesthetic complications  Airway Mallampati: II  TM Distance: >3 FB Neck ROM: Full   Comment: Pt reports TMJ syndrome Dental  (+) Dental Advisory Given, Teeth Intact   Pulmonary COPD (last inhaler 6 months ago),  COPD inhaler, former smoker,    breath sounds clear to auscultation       Cardiovascular hypertension, Pt. on medications (-) anginanegative cardio ROS   Rhythm:Regular Rate:Normal  '14 Cath: nomal LVF, normal coronaries   Neuro/Psych negative neurological ROS     GI/Hepatic negative GI ROS, Neg liver ROS,   Endo/Other  Morbid obesity  Renal/GU negative Renal ROS     Musculoskeletal  (+) Arthritis ,   Abdominal (+) + obese,   Peds  Hematology negative hematology ROS (+)   Anesthesia Other Findings   Reproductive/Obstetrics                           Anesthesia Physical Anesthesia Plan  ASA: II  Anesthesia Plan: General   Post-op Pain Management:    Induction: Intravenous  PONV Risk Score and Plan: 3 and Ondansetron, Dexamethasone, Midazolam and Scopolamine patch - Pre-op  Airway Management Planned: LMA  Additional Equipment:   Intra-op Plan:   Post-operative Plan:   Informed Consent: I have reviewed the patients History and Physical, chart, labs and discussed the procedure including the risks, benefits and alternatives for the proposed anesthesia with the patient or authorized representative who has indicated his/her understanding and acceptance.   Dental advisory given  Plan Discussed with: CRNA and Surgeon  Anesthesia Plan Comments: (Plan routine monitors, GA- LMA OK)        Anesthesia Quick Evaluation

## 2017-11-22 NOTE — Discharge Instructions (Signed)
Bladder Biopsy, Care After Refer to this sheet in the next few weeks. These instructions provide you with information about caring for yourself after your procedure. Your health care provider may also give you more specific instructions. Your treatment has been planned according to current medical practices, but problems sometimes occur. Call your health care provider if you have any problems or questions after your procedure. What can I expect after the procedure? After the procedure, it is common to have:  Mild pain in your bladder or kidney area during urination.  Minor burning during urination.  Small amounts of blood in your urine.  A sudden urge to urinate.  A need to urinate more often than usual.  Follow these instructions at home: Medicines  Take over-the-counter and prescription medicines only as told by your health care provider.  If you were prescribed an antibiotic medicine, take it as told by your health care provider. Do not stop taking the antibiotic even if you start to feel better. General instructions   Take a warm bath to relieve any burning sensations around your urethra.  Hold a warm, damp washcloth over the urethral area to ease pain.  Return to your normal activities as told by your health care provider. Ask your health care provider what activities are safe for you.  Do not drive for 24 hours if you received a medicine to help you relax (sedative) during your procedure. Ask your health care provider when it is safe for you to drive.  It is your responsibility to get the results of your procedure. Ask your health care provider or the department performing the procedure when your results will be ready.  Keep all follow-up visits as told by your health care provider. This is important. Contact a health care provider if:  You have a fever.  Your symptoms do not improve within 24 hours and you continue to have: ? Burning during urination. ? Increasing  amounts of blood in your urine. ? Pain during urination. ? An urgent need to urinate. ? A need to urinate more often than usual. Get help right away if:  You have a lot of bleeding or more bleeding.  You have severe pain.  You are unable to urinate.  You have bright red blood in your urine.  You are passing blood clots in your urine.  You have a fever.  You have swelling, redness, or pain in your legs.  You have difficulty breathing. This information is not intended to replace advice given to you by your health care provider. Make sure you discuss any questions you have with your health care provider. Document Released: 11/29/2015 Document Revised: 04/19/2016 Document Reviewed: 11/29/2015 Elsevier Interactive Patient Education  2018 [place] [facility] Home Care Instructions  Activity: Get plenty of rest for the remainder of the day. A responsible individual must stay with you for 24 hours following the procedure.  For the next 24 hours, DO NOT: -Drive a car -Paediatric nurse -Drink alcoholic beverages -Take any medication unless instructed by your physician -Make any legal decisions or sign important papers.  Meals: Start with liquid foods such as gelatin or soup. Progress to regular foods as tolerated. Avoid greasy, spicy, heavy foods. If nausea and/or vomiting occur, drink only clear liquids until the nausea and/or vomiting subsides. Call your physician if vomiting continues.  Special Instructions/Symptoms: Your throat may feel dry or sore from the anesthesia or the breathing tube placed in your throat during surgery. If this  causes discomfort, gargle with warm salt water. The discomfort should disappear within 24 hours.  If you had a scopolamine patch placed behind your ear for the management of post- operative nausea and/or vomiting:  1. The medication in the patch is effective for 72 hours, after which it should be removed.  Wrap patch in a tissue and  discard in the trash. Wash hands thoroughly with soap and water. 2. You may remove the patch earlier than 72 hours if you experience unpleasant side effects which may include dry mouth, dizziness or visual disturbances. 3. Avoid touching the patch. Wash your hands with soap and water after contact with the patch.

## 2017-11-22 NOTE — Anesthesia Procedure Notes (Signed)
Procedure Name: LMA Insertion Date/Time: 11/22/2017 12:57 PM Performed by: Wanita Chamberlain, CRNA Pre-anesthesia Checklist: Patient identified, Timeout performed, Emergency Drugs available, Suction available and Patient being monitored Patient Re-evaluated:Patient Re-evaluated prior to induction Oxygen Delivery Method: Circle system utilized Preoxygenation: Pre-oxygenation with 100% oxygen Induction Type: IV induction Ventilation: Mask ventilation without difficulty LMA: LMA inserted LMA Size: 4.0 Number of attempts: 1 Placement Confirmation: breath sounds checked- equal and bilateral and positive ETCO2 Tube secured with: Tape Dental Injury: Teeth and Oropharynx as per pre-operative assessment

## 2017-11-25 ENCOUNTER — Encounter (HOSPITAL_BASED_OUTPATIENT_CLINIC_OR_DEPARTMENT_OTHER): Payer: Self-pay | Admitting: Urology

## 2017-11-27 LAB — POCT HEMOGLOBIN-HEMACUE: HEMOGLOBIN: 13.4 g/dL (ref 12.0–15.0)

## 2018-04-02 DIAGNOSIS — F419 Anxiety disorder, unspecified: Secondary | ICD-10-CM | POA: Diagnosis not present

## 2018-04-11 DIAGNOSIS — L292 Pruritus vulvae: Secondary | ICD-10-CM | POA: Diagnosis not present

## 2018-04-11 DIAGNOSIS — N763 Subacute and chronic vulvitis: Secondary | ICD-10-CM | POA: Diagnosis not present

## 2018-04-16 DIAGNOSIS — F419 Anxiety disorder, unspecified: Secondary | ICD-10-CM | POA: Diagnosis not present

## 2018-04-25 DIAGNOSIS — F419 Anxiety disorder, unspecified: Secondary | ICD-10-CM | POA: Diagnosis not present

## 2018-05-01 DIAGNOSIS — F419 Anxiety disorder, unspecified: Secondary | ICD-10-CM | POA: Diagnosis not present

## 2018-05-08 DIAGNOSIS — C672 Malignant neoplasm of lateral wall of bladder: Secondary | ICD-10-CM | POA: Diagnosis not present

## 2018-05-14 DIAGNOSIS — H1012 Acute atopic conjunctivitis, left eye: Secondary | ICD-10-CM | POA: Diagnosis not present

## 2018-05-15 DIAGNOSIS — F419 Anxiety disorder, unspecified: Secondary | ICD-10-CM | POA: Diagnosis not present

## 2018-05-18 DIAGNOSIS — B349 Viral infection, unspecified: Secondary | ICD-10-CM | POA: Diagnosis not present

## 2018-05-18 DIAGNOSIS — H109 Unspecified conjunctivitis: Secondary | ICD-10-CM | POA: Diagnosis not present

## 2018-05-18 DIAGNOSIS — J029 Acute pharyngitis, unspecified: Secondary | ICD-10-CM | POA: Diagnosis not present

## 2018-06-09 DIAGNOSIS — F419 Anxiety disorder, unspecified: Secondary | ICD-10-CM | POA: Diagnosis not present

## 2018-06-26 DIAGNOSIS — F419 Anxiety disorder, unspecified: Secondary | ICD-10-CM | POA: Diagnosis not present

## 2018-07-24 DIAGNOSIS — F419 Anxiety disorder, unspecified: Secondary | ICD-10-CM | POA: Diagnosis not present

## 2018-08-07 DIAGNOSIS — F419 Anxiety disorder, unspecified: Secondary | ICD-10-CM | POA: Diagnosis not present

## 2018-09-04 DIAGNOSIS — F419 Anxiety disorder, unspecified: Secondary | ICD-10-CM | POA: Diagnosis not present

## 2018-09-18 DIAGNOSIS — F419 Anxiety disorder, unspecified: Secondary | ICD-10-CM | POA: Diagnosis not present

## 2018-10-02 DIAGNOSIS — F419 Anxiety disorder, unspecified: Secondary | ICD-10-CM | POA: Diagnosis not present

## 2018-10-16 DIAGNOSIS — F419 Anxiety disorder, unspecified: Secondary | ICD-10-CM | POA: Diagnosis not present

## 2018-11-06 DIAGNOSIS — F419 Anxiety disorder, unspecified: Secondary | ICD-10-CM | POA: Diagnosis not present

## 2018-11-27 DIAGNOSIS — F419 Anxiety disorder, unspecified: Secondary | ICD-10-CM | POA: Diagnosis not present

## 2018-11-28 ENCOUNTER — Other Ambulatory Visit: Payer: Self-pay | Admitting: Internal Medicine

## 2018-11-28 DIAGNOSIS — Z23 Encounter for immunization: Secondary | ICD-10-CM | POA: Diagnosis not present

## 2018-11-28 DIAGNOSIS — Z0000 Encounter for general adult medical examination without abnormal findings: Secondary | ICD-10-CM | POA: Diagnosis not present

## 2018-11-28 DIAGNOSIS — E7800 Pure hypercholesterolemia, unspecified: Secondary | ICD-10-CM | POA: Diagnosis not present

## 2018-11-28 DIAGNOSIS — C672 Malignant neoplasm of lateral wall of bladder: Secondary | ICD-10-CM | POA: Diagnosis not present

## 2018-11-28 DIAGNOSIS — Z1159 Encounter for screening for other viral diseases: Secondary | ICD-10-CM | POA: Diagnosis not present

## 2018-11-28 DIAGNOSIS — I10 Essential (primary) hypertension: Secondary | ICD-10-CM | POA: Diagnosis not present

## 2018-11-28 DIAGNOSIS — Z13820 Encounter for screening for osteoporosis: Secondary | ICD-10-CM | POA: Diagnosis not present

## 2018-11-28 DIAGNOSIS — Z1389 Encounter for screening for other disorder: Secondary | ICD-10-CM | POA: Diagnosis not present

## 2018-11-28 DIAGNOSIS — Z8551 Personal history of malignant neoplasm of bladder: Secondary | ICD-10-CM | POA: Diagnosis not present

## 2018-11-28 DIAGNOSIS — F419 Anxiety disorder, unspecified: Secondary | ICD-10-CM | POA: Diagnosis not present

## 2018-12-11 DIAGNOSIS — F419 Anxiety disorder, unspecified: Secondary | ICD-10-CM | POA: Diagnosis not present

## 2018-12-25 DIAGNOSIS — F419 Anxiety disorder, unspecified: Secondary | ICD-10-CM | POA: Diagnosis not present

## 2019-01-16 DIAGNOSIS — F419 Anxiety disorder, unspecified: Secondary | ICD-10-CM | POA: Diagnosis not present

## 2019-01-23 ENCOUNTER — Other Ambulatory Visit: Payer: BLUE CROSS/BLUE SHIELD

## 2019-01-29 DIAGNOSIS — F419 Anxiety disorder, unspecified: Secondary | ICD-10-CM | POA: Diagnosis not present

## 2019-01-30 DIAGNOSIS — J069 Acute upper respiratory infection, unspecified: Secondary | ICD-10-CM | POA: Diagnosis not present

## 2019-02-19 DIAGNOSIS — F419 Anxiety disorder, unspecified: Secondary | ICD-10-CM | POA: Diagnosis not present

## 2019-02-26 DIAGNOSIS — R05 Cough: Secondary | ICD-10-CM | POA: Diagnosis not present

## 2019-02-26 DIAGNOSIS — R0981 Nasal congestion: Secondary | ICD-10-CM | POA: Diagnosis not present

## 2019-02-26 DIAGNOSIS — R509 Fever, unspecified: Secondary | ICD-10-CM | POA: Diagnosis not present

## 2019-03-02 ENCOUNTER — Other Ambulatory Visit: Payer: Self-pay | Admitting: Internal Medicine

## 2019-03-02 DIAGNOSIS — Z1231 Encounter for screening mammogram for malignant neoplasm of breast: Secondary | ICD-10-CM

## 2019-03-05 DIAGNOSIS — F419 Anxiety disorder, unspecified: Secondary | ICD-10-CM | POA: Diagnosis not present

## 2019-03-11 DIAGNOSIS — F419 Anxiety disorder, unspecified: Secondary | ICD-10-CM | POA: Diagnosis not present

## 2019-03-25 DIAGNOSIS — F419 Anxiety disorder, unspecified: Secondary | ICD-10-CM | POA: Diagnosis not present

## 2019-03-27 ENCOUNTER — Other Ambulatory Visit: Payer: Self-pay

## 2019-04-01 DIAGNOSIS — F419 Anxiety disorder, unspecified: Secondary | ICD-10-CM | POA: Diagnosis not present

## 2019-04-08 DIAGNOSIS — F419 Anxiety disorder, unspecified: Secondary | ICD-10-CM | POA: Diagnosis not present

## 2019-04-15 DIAGNOSIS — F419 Anxiety disorder, unspecified: Secondary | ICD-10-CM | POA: Diagnosis not present

## 2019-04-22 DIAGNOSIS — F419 Anxiety disorder, unspecified: Secondary | ICD-10-CM | POA: Diagnosis not present

## 2019-04-29 DIAGNOSIS — F419 Anxiety disorder, unspecified: Secondary | ICD-10-CM | POA: Diagnosis not present

## 2019-05-06 DIAGNOSIS — F419 Anxiety disorder, unspecified: Secondary | ICD-10-CM | POA: Diagnosis not present

## 2019-05-13 DIAGNOSIS — F419 Anxiety disorder, unspecified: Secondary | ICD-10-CM | POA: Diagnosis not present

## 2019-05-20 DIAGNOSIS — F419 Anxiety disorder, unspecified: Secondary | ICD-10-CM | POA: Diagnosis not present

## 2019-05-27 DIAGNOSIS — F419 Anxiety disorder, unspecified: Secondary | ICD-10-CM | POA: Diagnosis not present

## 2019-05-28 ENCOUNTER — Observation Stay (HOSPITAL_COMMUNITY): Payer: Medicare Other

## 2019-05-28 ENCOUNTER — Inpatient Hospital Stay (HOSPITAL_COMMUNITY)
Admission: EM | Admit: 2019-05-28 | Discharge: 2019-05-31 | DRG: 390 | Disposition: A | Discharge status: Home / Self Care | Payer: Medicare Other | Attending: Surgery | Admitting: Surgery

## 2019-05-28 ENCOUNTER — Encounter (HOSPITAL_COMMUNITY): Payer: Self-pay

## 2019-05-28 ENCOUNTER — Other Ambulatory Visit: Payer: Self-pay

## 2019-05-28 ENCOUNTER — Emergency Department (HOSPITAL_COMMUNITY): Payer: Medicare Other

## 2019-05-28 DIAGNOSIS — Z87891 Personal history of nicotine dependence: Secondary | ICD-10-CM

## 2019-05-28 DIAGNOSIS — K5730 Diverticulosis of large intestine without perforation or abscess without bleeding: Secondary | ICD-10-CM | POA: Diagnosis not present

## 2019-05-28 DIAGNOSIS — E785 Hyperlipidemia, unspecified: Secondary | ICD-10-CM | POA: Diagnosis present

## 2019-05-28 DIAGNOSIS — K56609 Unspecified intestinal obstruction, unspecified as to partial versus complete obstruction: Secondary | ICD-10-CM | POA: Diagnosis not present

## 2019-05-28 DIAGNOSIS — F419 Anxiety disorder, unspecified: Secondary | ICD-10-CM | POA: Diagnosis not present

## 2019-05-28 DIAGNOSIS — R110 Nausea: Secondary | ICD-10-CM | POA: Diagnosis not present

## 2019-05-28 DIAGNOSIS — Z906 Acquired absence of other parts of urinary tract: Secondary | ICD-10-CM | POA: Diagnosis not present

## 2019-05-28 DIAGNOSIS — Z8551 Personal history of malignant neoplasm of bladder: Secondary | ICD-10-CM

## 2019-05-28 DIAGNOSIS — Z90710 Acquired absence of both cervix and uterus: Secondary | ICD-10-CM

## 2019-05-28 DIAGNOSIS — R112 Nausea with vomiting, unspecified: Secondary | ICD-10-CM | POA: Diagnosis not present

## 2019-05-28 DIAGNOSIS — Z03818 Encounter for observation for suspected exposure to other biological agents ruled out: Secondary | ICD-10-CM | POA: Diagnosis not present

## 2019-05-28 DIAGNOSIS — Z888 Allergy status to other drugs, medicaments and biological substances status: Secondary | ICD-10-CM

## 2019-05-28 DIAGNOSIS — Z79890 Hormone replacement therapy (postmenopausal): Secondary | ICD-10-CM

## 2019-05-28 DIAGNOSIS — R1084 Generalized abdominal pain: Secondary | ICD-10-CM | POA: Diagnosis not present

## 2019-05-28 DIAGNOSIS — R109 Unspecified abdominal pain: Secondary | ICD-10-CM | POA: Diagnosis not present

## 2019-05-28 DIAGNOSIS — Z8719 Personal history of other diseases of the digestive system: Secondary | ICD-10-CM

## 2019-05-28 DIAGNOSIS — Z20828 Contact with and (suspected) exposure to other viral communicable diseases: Secondary | ICD-10-CM | POA: Diagnosis not present

## 2019-05-28 DIAGNOSIS — R1031 Right lower quadrant pain: Secondary | ICD-10-CM | POA: Diagnosis not present

## 2019-05-28 DIAGNOSIS — Z79899 Other long term (current) drug therapy: Secondary | ICD-10-CM

## 2019-05-28 DIAGNOSIS — Z9049 Acquired absence of other specified parts of digestive tract: Secondary | ICD-10-CM

## 2019-05-28 DIAGNOSIS — Z91040 Latex allergy status: Secondary | ICD-10-CM

## 2019-05-28 DIAGNOSIS — Z885 Allergy status to narcotic agent status: Secondary | ICD-10-CM

## 2019-05-28 DIAGNOSIS — K56600 Partial intestinal obstruction, unspecified as to cause: Secondary | ICD-10-CM | POA: Diagnosis not present

## 2019-05-28 LAB — COMPREHENSIVE METABOLIC PANEL
ALT: 17 U/L (ref 0–44)
AST: 19 U/L (ref 15–41)
Albumin: 4.3 g/dL (ref 3.5–5.0)
Alkaline Phosphatase: 101 U/L (ref 38–126)
Anion gap: 11 (ref 5–15)
BUN: 15 mg/dL (ref 8–23)
CO2: 23 mmol/L (ref 22–32)
Calcium: 9.6 mg/dL (ref 8.9–10.3)
Chloride: 105 mmol/L (ref 98–111)
Creatinine, Ser: 0.88 mg/dL (ref 0.44–1.00)
GFR calc Af Amer: >60 mL/min (ref >60)
GFR calc non Af Amer: >60 mL/min (ref >60)
Glucose, Bld: 127 mg/dL — ABNORMAL HIGH (ref 70–99)
Potassium: 4 mmol/L (ref 3.5–5.1)
Sodium: 139 mmol/L (ref 135–145)
Total Bilirubin: 0.6 mg/dL (ref 0.3–1.2)
Total Protein: 7.5 g/dL (ref 6.5–8.1)

## 2019-05-28 LAB — CBC WITH DIFFERENTIAL/PLATELET
Abs Immature Granulocytes: 0.04 10*3/uL (ref 0.00–0.07)
Basophils Absolute: 0 10*3/uL (ref 0.0–0.1)
Basophils Relative: 0 %
Eosinophils Absolute: 0 10*3/uL (ref 0.0–0.5)
Eosinophils Relative: 0 %
HCT: 44.9 % (ref 36.0–46.0)
Hemoglobin: 14.4 g/dL (ref 12.0–15.0)
Immature Granulocytes: 0 %
Lymphocytes Relative: 10 %
Lymphs Abs: 1.1 10*3/uL (ref 0.7–4.0)
MCH: 29.4 pg (ref 26.0–34.0)
MCHC: 32.1 g/dL (ref 30.0–36.0)
MCV: 91.6 fL (ref 80.0–100.0)
Monocytes Absolute: 0.4 10*3/uL (ref 0.1–1.0)
Monocytes Relative: 3 %
Neutro Abs: 9.9 10*3/uL — ABNORMAL HIGH (ref 1.7–7.7)
Neutrophils Relative %: 87 %
Platelets: 219 10*3/uL (ref 150–400)
RBC: 4.9 MIL/uL (ref 3.87–5.11)
RDW: 13.2 % (ref 11.5–15.5)
WBC: 11.5 10*3/uL — ABNORMAL HIGH (ref 4.0–10.5)
nRBC: 0 % (ref 0.0–0.2)

## 2019-05-28 LAB — URINALYSIS, ROUTINE W REFLEX MICROSCOPIC
Bilirubin Urine: NEGATIVE
Glucose, UA: NEGATIVE mg/dL
Hgb urine dipstick: NEGATIVE
Ketones, ur: NEGATIVE mg/dL
Leukocytes,Ua: NEGATIVE
Nitrite: NEGATIVE
Protein, ur: NEGATIVE mg/dL
Specific Gravity, Urine: 1.005 (ref 1.005–1.030)
pH: 7 (ref 5.0–8.0)

## 2019-05-28 LAB — SARS CORONAVIRUS 2 BY RT PCR (HOSPITAL ORDER, PERFORMED IN ~~LOC~~ HOSPITAL LAB): SARS Coronavirus 2: NEGATIVE

## 2019-05-28 LAB — LIPASE, BLOOD: Lipase: 34 U/L (ref 11–51)

## 2019-05-28 LAB — LACTIC ACID, PLASMA: Lactic Acid, Venous: 0.8 mmol/L (ref 0.5–1.9)

## 2019-05-28 MED ORDER — PANTOPRAZOLE SODIUM 40 MG IV SOLR
40.0000 mg | Freq: Every day | INTRAVENOUS | Status: DC
  Administered 2019-05-28 – 2019-05-30 (×3): 40 mg via INTRAVENOUS
  Filled 2019-05-28 (×3): qty 40

## 2019-05-28 MED ORDER — MORPHINE SULFATE (PF) 4 MG/ML IV SOLN
4.0000 mg | Freq: Once | INTRAVENOUS | Status: AC
  Administered 2019-05-28: 11:00:00 4 mg via INTRAVENOUS
  Filled 2019-05-28: qty 1

## 2019-05-28 MED ORDER — SODIUM CHLORIDE 0.9 % IV BOLUS
1000.0000 mL | Freq: Once | INTRAVENOUS | Status: AC
  Administered 2019-05-28: 11:00:00 1000 mL via INTRAVENOUS

## 2019-05-28 MED ORDER — ONDANSETRON HCL 4 MG/2ML IJ SOLN
4.0000 mg | Freq: Once | INTRAMUSCULAR | Status: AC
  Administered 2019-05-28: 4 mg via INTRAVENOUS
  Filled 2019-05-28: qty 2

## 2019-05-28 MED ORDER — METOPROLOL TARTRATE 5 MG/5ML IV SOLN: 5.0000 mg | Freq: Four times a day (QID) | INTRAVENOUS | Status: DC | PRN

## 2019-05-28 MED ORDER — ENOXAPARIN SODIUM 40 MG/0.4ML ~~LOC~~ SOLN
40.0000 mg | SUBCUTANEOUS | Status: DC
  Administered 2019-05-28 – 2019-05-30 (×3): 40 mg via SUBCUTANEOUS
  Filled 2019-05-28 (×3): qty 0.4

## 2019-05-28 MED ORDER — KCL IN DEXTROSE-NACL 20-5-0.45 MEQ/L-%-% IV SOLN
INTRAVENOUS | Status: DC
  Administered 2019-05-28 – 2019-05-31 (×6): via INTRAVENOUS
  Filled 2019-05-28 (×7): qty 1000

## 2019-05-28 MED ORDER — DIATRIZOATE MEGLUMINE & SODIUM 66-10 % PO SOLN
90.0000 mL | Freq: Once | ORAL | Status: AC
  Administered 2019-05-28: 90 mL via ORAL
  Filled 2019-05-28: qty 90

## 2019-05-28 MED ORDER — ONDANSETRON 4 MG PO TBDP: 4.0000 mg | ORAL_TABLET | Freq: Four times a day (QID) | ORAL | Status: DC | PRN

## 2019-05-28 MED ORDER — MORPHINE SULFATE (PF) 2 MG/ML IV SOLN
2.0000 mg | INTRAVENOUS | Status: DC | PRN
  Administered 2019-05-28: 20:00:00 4 mg via INTRAVENOUS
  Filled 2019-05-28: qty 2

## 2019-05-28 MED ORDER — ONDANSETRON HCL 4 MG/2ML IJ SOLN
4.0000 mg | Freq: Four times a day (QID) | INTRAMUSCULAR | Status: DC | PRN
  Administered 2019-05-28 – 2019-05-29 (×3): 4 mg via INTRAVENOUS
  Filled 2019-05-28 (×4): qty 2

## 2019-05-28 MED ORDER — IOPAMIDOL (ISOVUE-300) INJECTION 61%
100.0000 mL | Freq: Once | INTRAVENOUS | Status: AC | PRN
  Administered 2019-05-28: 13:00:00 100 mL via INTRAVENOUS

## 2019-05-28 NOTE — ED Provider Notes (Signed)
[facility] EMERGENCY DEPARTMENT Provider Note   CSN: [id number] Arrival date & time: 05/28/19  1010    History   Chief Complaint Chief Complaint  Patient presents with   Abdominal Pain    HPI Christina Campos is a 68 y.o. female.     Christina Campos is a 68 y.o. female with history of hypertension, hyperlipidemia, asthma, small bowel obstruction and bladder cancer s/p resection, who presents to the ED for evaluation of central abdominal pain.  Patient reports pain started last night and has worsened throughout the night and this morning.  She reports that she began feeling very nauseated and unwell yesterday, this morning had episode of nonbloody, nonbilious emesis.  She reports no associated fevers or chills.  She reports for the past few days she has been constipated overnight she passed some liquidy, pasty stools but has not had a solid bowel movement in multiple days.  Reports very decreased appetite.  No dysuria or urinary frequency.  No pelvic pain, vaginal bleeding or discharge.  No chest pain or shortness of breath, no cough.  No sick contacts.  She has not taken anything for her symptoms prior to arrival, no other aggravating or alleviating factors.  History of small bowel obstruction in 2015, with very similar symptoms.     Past Medical History:  Diagnosis Date   Anxiety    Bladder cancer [facility]) urologist-  dr Alyson Ingles   dx 06/ 2016   History of diverticulitis of colon    Aug 2011--  resolved no surgical intervention   History of exercise stress test    Abnormal stress ehco  09-23-2013---  negative ischemia w/  no ST changes,  after exercise there was hypokinesis in the mid/distal lateral wall and apex consistent with ischemia Positive stress echo   History of gestational diabetes    History of hypertension    PT CHANGED STRESSFUL JOB AND HYPERTENSION RESOLVED , NO MEDICATION SINCE 07/ 2018 APPROX.   History of small bowel obstruction    09-01-2014--  per CT adhesions--  resolved without surgical intervention   Hyperlipidemia    OA (osteoarthritis)    Palpitations    PVC (premature ventricular contraction)    Seasonal allergic rhinitis    Wears glasses     Patient Active Problem List   Diagnosis Date Noted   SBO (small bowel obstruction) ([place]) 09/01/2014   Asthma, chronic 09/01/2014   Leukocytosis 09/01/2014   Hyperglycemia 09/01/2014   PVC (premature ventricular contraction) 09/02/2013   Chest tightness 09/02/2013   HTN (hypertension) 09/02/2013   Hypercholesterolemia 09/02/2013    Past Surgical History:  Procedure Laterality Date   BREAST BIOPSY     CHOLECYSTECTOMY N/A 10/16/2013   Procedure: LAPAROSCOPIC CHOLECYSTECTOMY WITH INTRAOPERATIVE CHOLANGIOGRAM;  Surgeon: Rolm Bookbinder, MD;  Location: [facility];  Service: General;  Laterality: N/A;   COLONOSCOPY  11-09-2010   CYSTOSCOPY W/ RETROGRADES Bilateral 05/18/2015   Procedure: CYSTOSCOPY WITH  BILATERAL RETROGRADE PYELOGRAM;  Surgeon: Cleon Gustin, MD;  Location: [facility];  Service: Urology;  Laterality: Bilateral;   CYSTOSCOPY W/ RETROGRADES Bilateral 11/22/2017   Procedure: CYSTOSCOPY WITH RETROGRADE PYELOGRAM;  Surgeon: Cleon Gustin, MD;  Location: [facility];  Service: Urology;  Laterality: Bilateral;   LEFT HEART CATHETERIZATION WITH CORONARY ANGIOGRAM N/A 10/07/2013   Procedure: LEFT HEART CATHETERIZATION WITH CORONARY ANGIOGRAM;  Surgeon: Peter M Martinique, MD;  Location: [facility] CATH LAB;  Service: Cardiovascular;  Laterality: N/A;  Normal coronary arteries;  Normal LV function, ef 55-65%   TEMPOROMANDIBULAR JOINT SURGERY  1987   TOTAL ABDOMINAL HYSTERECTOMY  06-17-2003   w/ Right Ovarian Cystectomy and McCall Culdoplasty   TRANSURETHRAL RESECTION OF BLADDER TUMOR N/A 11/22/2017   Procedure: TRANSURETHRAL RESECTION OF BLADDER TUMOR (TURBT);  Surgeon: Cleon Gustin, MD;  Location: [facility];  Service: Urology;  Laterality: N/A;   TRANSURETHRAL RESECTION OF BLADDER TUMOR WITH GYRUS (TURBT-GYRUS) N/A 05/18/2015   Procedure: TRANSURETHRAL RESECTION OF BLADDER TUMOR WITH GYRUS (TURBT-GYRUS);  Surgeon: Cleon Gustin, MD;  Location: [facility];  Service: Urology;  Laterality: N/A;   URETEROSCOPY Bilateral 05/18/2015   Procedure:  ATTEMPTED URETEROSCOPY LEFT;  Surgeon: Cleon Gustin, MD;  Location: [facility];  Service: Urology;  Laterality: Bilateral;     OB History   No obstetric history on file.      Home Medications    Prior to Admission medications   Medication Sig Start Date End Date Taking? Authorizing Provider  acetaminophen (TYLENOL) 325 MG tablet Take 650 mg by mouth every 6 (six) hours as needed.    [provider]  estrogens, conjugated, (PREMARIN) 0.625 MG tablet Take 0.625 mg by mouth every evening.     [provider]  ibuprofen (ADVIL,MOTRIN) 200 MG tablet Take 400 mg by mouth every 6 (six) hours as needed for pain.     [provider]  rosuvastatin (CRESTOR) 5 MG tablet Take 5 mg by mouth at bedtime.     [provider]  vortioxetine HBr (TRINTELLIX) 10 MG TABS Take 1 tablet by mouth every evening.    [provider]    Family History Family History  Problem Relation Age of Onset   Arrhythmia Brother    Arrhythmia Brother    Heart disease Father    Hypertension Father    Heart attack Father    Diabetes Mother    Diabetes Maternal Grandfather    Diabetes Paternal Grandmother    Breast cancer Maternal Aunt     Social History Social History   Tobacco Use   Smoking status: Former Smoker    Packs/day: 1.00    Years: 35.00    Pack years: 35.00    Types: Cigarettes    Quit date: 12/04/1993    Years since quitting: 25.4   Smokeless tobacco: Never Used  Substance Use Topics   Alcohol use: Yes    Alcohol/week: 2.0 standard drinks     Types: 2 Glasses of wine per week   Drug use: No     Allergies   Codeine, Latex, Lipitor [atorvastatin], Lisinopril, Wellbutrin [bupropion], Zocor [simvastatin], and Zoloft [sertraline]   Review of Systems Review of Systems  Constitutional: Negative for chills and fever.  HENT: Negative.   Respiratory: Negative for cough and shortness of breath.   Cardiovascular: Negative for chest pain.  Gastrointestinal: Positive for abdominal pain, constipation, nausea and vomiting. Negative for blood in stool.  Genitourinary: Negative for dysuria, flank pain, frequency, pelvic pain and vaginal bleeding.  Musculoskeletal: Negative for arthralgias and myalgias.  Skin: Negative for color change and rash.  Neurological: Negative for dizziness, speech difficulty and light-headedness.  All other systems reviewed and are negative.    Physical Exam Updated Vital Signs BP 138/79    Pulse 77    Temp 97.6 F (36.4 C) (Oral)    Resp 16    Ht 5\' 7"  (1.702 m)    Wt 72.6 kg    SpO2 99%  BMI 25.06 kg/m   Physical Exam Vitals signs and nursing note reviewed.  Constitutional:      General: She is not in acute distress.    Appearance: She is well-developed and normal weight. She is not ill-appearing or diaphoretic.  HENT:     Head: Normocephalic and atraumatic.     Mouth/Throat:     Mouth: Mucous membranes are moist.     Pharynx: Oropharynx is clear.  Eyes:     General:        Right eye: No discharge.        Left eye: No discharge.     Pupils: Pupils are equal, round, and reactive to light.  Neck:     Musculoskeletal: Neck supple.  Cardiovascular:     Rate and Rhythm: Normal rate and regular rhythm.     Heart sounds: Normal heart sounds. No murmur. No friction rub. No gallop.   Pulmonary:     Effort: Pulmonary effort is normal. No respiratory distress.     Breath sounds: Normal breath sounds. No wheezing or rales.     Comments: Respirations equal and unlabored, patient able to speak in full  sentences, lungs clear to auscultation bilaterally Abdominal:     General: Bowel sounds are decreased. There is distension.     Palpations: Abdomen is soft. There is no mass.     Tenderness: There is generalized abdominal tenderness. There is guarding. There is no rebound.     Comments: Demented is minimally distended but soft, bowel sounds are present, but hypoactive, generalized tenderness throughout the abdomen with mild guarding, no rebound tenderness, no CVA tenderness.  No hernias noted.  Musculoskeletal:        General: No deformity.  Skin:    General: Skin is warm and dry.     Capillary Refill: Capillary refill takes less than 2 seconds.  Neurological:     Mental Status: She is alert.     Coordination: Coordination normal.     Comments: Speech is clear, able to follow commands Moves extremities without ataxia, coordination intact  Psychiatric:        Mood and Affect: Mood normal.        Behavior: Behavior normal.      ED Treatments / Results  Labs (all labs ordered are listed, but only abnormal results are displayed) Labs Reviewed  COMPREHENSIVE METABOLIC PANEL - Abnormal; Notable for the following components:      Result Value   Glucose, Bld 127 (*)    All other components within normal limits  CBC WITH DIFFERENTIAL/PLATELET - Abnormal; Notable for the following components:   WBC 11.5 (*)    Neutro Abs 9.9 (*)    All other components within normal limits  URINALYSIS, ROUTINE W REFLEX MICROSCOPIC - Abnormal; Notable for the following components:   Color, Urine STRAW (*)    All other components within normal limits  SARS CORONAVIRUS 2 (HOSPITAL ORDER, [facility])  LIPASE, BLOOD  LACTIC ACID, PLASMA  LACTIC ACID, PLASMA  CBC  CREATININE, SERUM    EKG None  Radiology Ct Abdomen Pelvis W Contrast  Result Date: 05/28/2019 CLINICAL DATA:  Abdominal pain, history of cholecystectomy history of bladder cancer EXAM: CT ABDOMEN AND PELVIS  WITH CONTRAST TECHNIQUE: Multidetector CT imaging of the abdomen and pelvis was performed using the standard protocol following bolus administration of intravenous contrast. CONTRAST:  18mL ISOVUE-300 IOPAMIDOL (ISOVUE-300) INJECTION 61% COMPARISON:  09/01/2014 FINDINGS: Lower chest: No acute abnormality. Hepatobiliary: No  solid liver abnormality is seen. No gallstones, gallbladder wall thickening, or biliary dilatation. Pancreas: Unremarkable. No pancreatic ductal dilatation or surrounding inflammatory changes. Spleen: Normal in size without significant abnormality. Adrenals/Urinary Tract: Adrenal glands are unremarkable. Kidneys are normal, without renal calculi, solid lesion, or hydronephrosis. Bladder is unremarkable. Stomach/Bowel: Stomach is within normal limits. Appendix appears normal. The distal small bowel is diffusely fluid filled with multiple segments of thickening, narrowed appearing bowel in the low abdomen and pelvis (series 3, image 80, series 6, image 75). Severe descending and sigmoid diverticulosis. Vascular/Lymphatic: Aortic atherosclerosis. No enlarged abdominal or pelvic lymph nodes. Reproductive: Status post hysterectomy. Other: Small, fat containing low midline ventral hernia (series 3, image 60). Small volume nonspecific free fluid in the low abdomen and pelvis. Musculoskeletal: No acute or significant osseous findings. IMPRESSION: 1. The distal small bowel is diffusely fluid filled with multiple segments of thickening, narrowed appearing bowel in the low abdomen and pelvis (series 3, image 80, series 6, image 75). This appearance is generally suspicious for bowel stricture, adhesion, and/or partial obstruction, and in keeping with appearance on prior CT dated 2015. Differential considerations include inflammatory bowel disease such as Crohn's disease. Correlate with referable clinical history, if present. 2. Small volume nonspecific free fluid in the low abdomen and pelvis, of uncertain  etiology. 3.  Postoperative findings of cholecystectomy and hysterectomy. 4.  Normal appendix. Electronically Signed   By: Eddie Candle M.D.   On: 05/28/2019 13:43    Procedures Procedures (including critical care time)  Medications Ordered in ED Medications  sodium chloride 0.9 % bolus 1,000 mL (has no administration in time range)  ondansetron (ZOFRAN) injection 4 mg (has no administration in time range)  morphine 4 MG/ML injection 4 mg (has no administration in time range)     Initial Impression / Assessment and Plan / ED Course  I have reviewed the triage vital signs and the nursing notes.  Pertinent labs & imaging results that were available during my care of the patient were reviewed by me and considered in my medical decision making (see chart for details).  Patient presents with central abdominal pain, vomiting and constipation, history of SBO in the past and this feels similar.  Afebrile with normal vitals on arrival.  Appears uncomfortable with generalized tenderness on exam with some mild guarding.  She is not currently actively vomiting.  Given story I do suspect that this could again be a small bowel obstruction will get abdominal labs and CT abdomen pelvis.  Pain and nausea medication provided as well as IV fluids.  Labs show slight leukocytosis of 11.5 with left shift, normal hemoglobin, glucose of 127 but no other acute electrolyte derangements, normal renal and liver function.  Lipase normal.  Urinalysis with no signs of infection.  CT abdomen pelvis consistent with small bowel obstruction with multiple fluid-filled segments of thickened small bowel there is an area suspicious for stricture, adhesion or partial obstruction in the lower abdomen and pelvis.  This has very similar appearance to CT 2015 when patient had bowel obstruction.  Will consult general surgery.  On reevaluation patient currently has good pain control and is not actively vomiting.  Case discussed with  Brigid Re with general surgery who will plan to see and admit the patient, they will see the patient before placing NG tube.  Final Clinical Impressions(s) / ED Diagnoses   Final diagnoses:  SBO (small bowel obstruction) [facility])    ED Discharge Orders    None  Jacqlyn Larsen, PA-C 05/28/19 1534    Charlesetta Shanks, MD 06/01/19 1121

## 2019-05-28 NOTE — ED Triage Notes (Addendum)
Christina Campos- pts husband [phone]

## 2019-05-28 NOTE — ED Triage Notes (Signed)
Pt c/o epigastric pain that is in the center of her abd that started after her dinner last night, she became nauseated throughout the night & endorses vomiting only once so far, has had 4 "loose/pasty" stools" since this began as well (per pt).

## 2019-05-28 NOTE — Plan of Care (Signed)
  Problem: Clinical Measurements: Goal: Ability to maintain clinical measurements within normal limits will improve Outcome: Progressing   Problem: Coping: Goal: Level of anxiety will decrease Outcome: Progressing   Problem: Elimination: Goal: Will not experience complications related to bowel motility Outcome: Progressing Goal: Will not experience complications related to urinary retention Outcome: Progressing   Problem: Pain Managment: Goal: General experience of comfort will improve Outcome: Progressing

## 2019-05-28 NOTE — H&P (Signed)
[facility] Admission Note  Christina Campos Apr 17, 1951  [id number].    Requesting MD: Carole Civil Chief Complaint/Reason for Consult: sbo HPI:  Patient is a 68 year old female who presented to [facility] with upper abdominal pain. Pain is sharp, stabbing and mostly located in epigastrium. Pain has progressively worsened since onset and feels similar to the last time patient had a bowel obstruction in 2015. That obstruction resolved without surgical intervention. Patient reports associated nausea with bilious emesis. Having some small paste-like stools but not passing any flatus. Denies fever, chills, chest pain, SOB, urinary symptoms. PMH otherwise significant for hx of bladder cancer s/p resection. Other past abdominal surgeries include abdominal hysterectomy and laparoscopic cholecystectomy. No blood thinning medications. Occasional alcohol use, denies illicit drug or tobacco use. Allergic to codeine but tolerates other pain medications.   ROS: Review of Systems  Constitutional: Negative for chills and fever.  Respiratory: Negative for cough and shortness of breath.   Cardiovascular: Negative for chest pain and palpitations.  Gastrointestinal: Positive for abdominal pain, nausea and vomiting. Negative for blood in stool, constipation, diarrhea and melena.  Genitourinary: Negative for dysuria, frequency, hematuria and urgency.  All other systems reviewed and are negative.   Family History  Problem Relation Age of Onset  . Arrhythmia Brother   . Arrhythmia Brother   . Heart disease Father   . Hypertension Father   . Heart attack Father   . Diabetes Mother   . Diabetes Maternal Grandfather   . Diabetes Paternal Grandmother   . Breast cancer Maternal Aunt     Past Medical History:  Diagnosis Date  . Anxiety   . Bladder cancer [facility]) urologist-  dr Alyson Ingles   dx 06/ 2016  . History of diverticulitis of colon    Aug 2011--  resolved no surgical intervention  . History of  exercise stress test    Abnormal stress ehco  09-23-2013---  negative ischemia w/  no ST changes,  after exercise there was hypokinesis in the mid/distal lateral wall and apex consistent with ischemia Positive stress echo  . History of gestational diabetes   . History of hypertension    PT CHANGED STRESSFUL JOB AND HYPERTENSION RESOLVED , NO MEDICATION SINCE 07/ 2018 APPROX.  Marland Kitchen History of small bowel obstruction    09-01-2014--  per CT adhesions--  resolved without surgical intervention  . Hyperlipidemia   . OA (osteoarthritis)   . Palpitations   . PVC (premature ventricular contraction)   . Seasonal allergic rhinitis   . Wears glasses     Past Surgical History:  Procedure Laterality Date  . BREAST BIOPSY    . CHOLECYSTECTOMY N/A 10/16/2013   Procedure: LAPAROSCOPIC CHOLECYSTECTOMY WITH INTRAOPERATIVE CHOLANGIOGRAM;  Surgeon: Rolm Bookbinder, MD;  Location: [facility];  Service: General;  Laterality: N/A;  . COLONOSCOPY  11-09-2010  . CYSTOSCOPY W/ RETROGRADES Bilateral 05/18/2015   Procedure: CYSTOSCOPY WITH  BILATERAL RETROGRADE PYELOGRAM;  Surgeon: Cleon Gustin, MD;  Location: [facility];  Service: Urology;  Laterality: Bilateral;  . CYSTOSCOPY W/ RETROGRADES Bilateral 11/22/2017   Procedure: CYSTOSCOPY WITH RETROGRADE PYELOGRAM;  Surgeon: Cleon Gustin, MD;  Location: [facility];  Service: Urology;  Laterality: Bilateral;  . LEFT HEART CATHETERIZATION WITH CORONARY ANGIOGRAM N/A 10/07/2013   Procedure: LEFT HEART CATHETERIZATION WITH CORONARY ANGIOGRAM;  Surgeon: Peter M Martinique, MD;  Location: [facility] CATH LAB;  Service: Cardiovascular;  Laterality: N/A;  Normal coronary arteries;  Normal LV function, ef 55-65%  . TEMPOROMANDIBULAR  JOINT SURGERY  1987  . TOTAL ABDOMINAL HYSTERECTOMY  06-17-2003   w/ Right Ovarian Cystectomy and McCall Culdoplasty  . TRANSURETHRAL RESECTION OF BLADDER TUMOR N/A 11/22/2017   Procedure: TRANSURETHRAL RESECTION OF  BLADDER TUMOR (TURBT);  Surgeon: Cleon Gustin, MD;  Location[facility];  Service: Urology;  Laterality: N/A;  . TRANSURETHRAL RESECTION OF BLADDER TUMOR WITH GYRUS (TURBT-GYRUS) N/A 05/18/2015   Procedure: TRANSURETHRAL RESECTION OF BLADDER TUMOR WITH GYRUS (TURBT-GYRUS);  Surgeon: Cleon Gustin, MD;  Location: [facility];  Service: Urology;  Laterality: N/A;  . URETEROSCOPY Bilateral 05/18/2015   Procedure:  ATTEMPTED URETEROSCOPY LEFT;  Surgeon: Cleon Gustin, MD;  [facility]  Service: Urology;  Laterality: Bilateral;    Social History:  reports that she quit smoking about 25 years ago. Her smoking use included cigarettes. She has a 35.00 pack-year smoking history. She has never used smokeless tobacco. She reports current alcohol use of about 2.0 standard drinks of alcohol per week. She reports that she does not use drugs.  Allergies:  Allergies  Allergen Reactions  . Codeine Shortness Of Breath and Swelling  . Latex Itching  . Lipitor [Atorvastatin] Other (See Comments)    confusion  . Lisinopril Other (See Comments)    cough  . Onion Nausea And Vomiting    Raw Acute pain in gastric area  . Wellbutrin [Bupropion] Other (See Comments)    Worsened anxiety  . Zocor [Simvastatin] Other (See Comments)    Confusion/ sleepy  . Zoloft [Sertraline] Other (See Comments)    Worsened anxiety    (Not in a hospital admission)   Blood pressure 133/63, pulse 81, temperature 97.6 F (36.4 C), temperature source Oral, resp. rate 10, height 5\' 7"  (1.702 m), weight 72.6 kg, SpO2 98 %. Physical Exam: Physical Exam Constitutional:      General: She is not in acute distress.    Appearance: She is well-developed and normal weight. She is not toxic-appearing.  HENT:     Head: Normocephalic and atraumatic.     Right Ear: External ear normal.     Left Ear: External ear normal.     Nose: Nose normal.  Eyes:     General:  Lids are normal.     Extraocular Movements: Extraocular movements intact.     Conjunctiva/sclera: Conjunctivae normal.  Neck:     Musculoskeletal: Normal range of motion and neck supple.  Cardiovascular:     Rate and Rhythm: Normal rate and regular rhythm.     Pulses:          Radial pulses are 2+ on the right side and 2+ on the left side.       Dorsalis pedis pulses are 2+ on the right side and 2+ on the left side.  Pulmonary:     Effort: Pulmonary effort is normal.     Breath sounds: Normal breath sounds.  Abdominal:     General: Abdomen is flat. There is no distension.     Palpations: Abdomen is soft. There is no hepatomegaly or splenomegaly.     Tenderness: There is no abdominal tenderness. There is no guarding or rebound.     Hernia: No hernia is present.  Musculoskeletal:     Comments: ROM grossly intact in all 4 extremities  Skin:    General: Skin is warm and dry.     Findings: No rash.  Neurological:     Mental Status: She is alert and oriented to  person, place, and time.  Psychiatric:        Attention and Perception: Attention and perception normal.        Mood and Affect: Mood and affect normal.        Speech: Speech normal.        Behavior: Behavior is cooperative.     Results for orders placed or performed during the hospital encounter of 05/28/19 (from the past 48 hour(s))  Urinalysis, Routine w reflex microscopic     Status: Abnormal   Collection Time: 05/28/19 10:42 AM  Result Value Ref Range   Color, Urine STRAW (A) YELLOW   APPearance CLEAR CLEAR   Specific Gravity, Urine 1.005 1.005 - 1.030   pH 7.0 5.0 - 8.0   Glucose, UA NEGATIVE NEGATIVE mg/dL   Hgb urine dipstick NEGATIVE NEGATIVE   Bilirubin Urine NEGATIVE NEGATIVE   Ketones, ur NEGATIVE NEGATIVE mg/dL   Protein, ur NEGATIVE NEGATIVE mg/dL   Nitrite NEGATIVE NEGATIVE   Leukocytes,Ua NEGATIVE NEGATIVE    Comment: Performed at [facility] [address], [place], [place] [address]   Comprehensive metabolic panel     Status: Abnormal   Collection Time: 05/28/19 11:00 AM  Result Value Ref Range   Sodium 139 135 - 145 mmol/L   Potassium 4.0 3.5 - 5.1 mmol/L   Chloride 105 98 - 111 mmol/L   CO2 23 22 - 32 mmol/L   Glucose, Bld 127 (H) 70 - 99 mg/dL   BUN 15 8 - 23 mg/dL   Creatinine, Ser 0.88 0.44 - 1.00 mg/dL   Calcium 9.6 8.9 - 10.3 mg/dL   Total Protein 7.5 6.5 - 8.1 g/dL   Albumin 4.3 3.5 - 5.0 g/dL   AST 19 15 - 41 U/L   ALT 17 0 - 44 U/L   Alkaline Phosphatase 101 38 - 126 U/L   Total Bilirubin 0.6 0.3 - 1.2 mg/dL   GFR calc non Af Amer >60 >60 mL/min   GFR calc Af Amer >60 >60 mL/min   Anion gap 11 5 - 15    Comment: Performed at [facility], [place] [address] [place], [place] [address]  Lipase, blood     Status: None   Collection Time: 05/28/19 11:00 AM  Result Value Ref Range   Lipase 34 11 - 51 U/L    Comment: Performed at [place] [address], [place], [place] [address]  CBC with Differential     Status: Abnormal   Collection Time: 05/28/19 11:00 AM  Result Value Ref Range   WBC 11.5 (H) 4.0 - 10.5 K/uL   RBC 4.90 3.87 - 5.11 MIL/uL   Hemoglobin 14.4 12.0 - 15.0 g/dL   HCT 44.9 36.0 - 46.0 %   MCV 91.6 80.0 - 100.0 fL   MCH 29.4 26.0 - 34.0 pg   MCHC 32.1 30.0 - 36.0 g/dL   RDW 13.2 11.5 - 15.5 %   Platelets 219 150 - 400 K/uL   nRBC 0.0 0.0 - 0.2 %   Neutrophils Relative % 87 %   Neutro Abs 9.9 (H) 1.7 - 7.7 K/uL   Lymphocytes Relative 10 %   Lymphs Abs 1.1 0.7 - 4.0 K/uL   Monocytes Relative 3 %   Monocytes Absolute 0.4 0.1 - 1.0 K/uL   Eosinophils Relative 0 %   Eosinophils Absolute 0.0 0.0 - 0.5 K/uL   Basophils Relative 0 %   Basophils Absolute 0.0 0.0 - 0.1 K/uL  Immature Granulocytes 0 %   Abs Immature Granulocytes 0.04 0.00 - 0.07 K/uL    Comment: Performed at [facility], [place] [address] [place], [place] [address]   Ct Abdomen Pelvis W Contrast  Result Date: 05/28/2019 CLINICAL DATA:  Abdominal  pain, history of cholecystectomy history of bladder cancer EXAM: CT ABDOMEN AND PELVIS WITH CONTRAST TECHNIQUE: Multidetector CT imaging of the abdomen and pelvis was performed using the standard protocol following bolus administration of intravenous contrast. CONTRAST:  195mL ISOVUE-300 IOPAMIDOL (ISOVUE-300) INJECTION 61% COMPARISON:  09/01/2014 FINDINGS: Lower chest: No acute abnormality. Hepatobiliary: No solid liver abnormality is seen. No gallstones, gallbladder wall thickening, or biliary dilatation. Pancreas: Unremarkable. No pancreatic ductal dilatation or surrounding inflammatory changes. Spleen: Normal in size without significant abnormality. Adrenals/Urinary Tract: Adrenal glands are unremarkable. Kidneys are normal, without renal calculi, solid lesion, or hydronephrosis. Bladder is unremarkable. Stomach/Bowel: Stomach is within normal limits. Appendix appears normal. The distal small bowel is diffusely fluid filled with multiple segments of thickening, narrowed appearing bowel in the low abdomen and pelvis (series 3, image 80, series 6, image 75). Severe descending and sigmoid diverticulosis. Vascular/Lymphatic: Aortic atherosclerosis. No enlarged abdominal or pelvic lymph nodes. Reproductive: Status post hysterectomy. Other: Small, fat containing low midline ventral hernia (series 3, image 60). Small volume nonspecific free fluid in the low abdomen and pelvis. Musculoskeletal: No acute or significant osseous findings. IMPRESSION: 1. The distal small bowel is diffusely fluid filled with multiple segments of thickening, narrowed appearing bowel in the low abdomen and pelvis (series 3, image 80, series 6, image 75). This appearance is generally suspicious for bowel stricture, adhesion, and/or partial obstruction, and in keeping with appearance on prior CT dated 2015. Differential considerations include inflammatory bowel disease such as Crohn's disease. Correlate with referable clinical history, if  present. 2. Small volume nonspecific free fluid in the low abdomen and pelvis, of uncertain etiology. 3.  Postoperative findings of cholecystectomy and hysterectomy. 4.  Normal appendix. Electronically Signed   By: Eddie Candle M.D.   On: 05/28/2019 13:43      Assessment/Plan Hx of Bladder cancer s/p resection   SBO - CT with diffusely fluid filled multiple segments of thickening, narrowed appearing bowel, concerning for psbo - abdominal exam fairly benign - no nausea or vomiting currently - sbo protocol with PO contrast - mobilize as able - admit to observation - hopefully this will resolve without surgical intervention  Brigid Re, [facility] 05/28/2019, 3:23 PM Pager: [place]: [phone]

## 2019-05-28 NOTE — Progress Notes (Signed)
Patient admitted to unit from ED, VSS. Patient settled in bed and oriented to unit and hospital routine. Patient c/o nausea, zofran administered per orders.  Pt resting comfortably in bed with call bell in reach and side rails up. Instructed patient to utilize call bell for assistance. Will continue to monitor closely for remainder of shift.

## 2019-05-28 NOTE — ED Triage Notes (Signed)
Reports epigastric pain that is similar to when she had a blockage in the past. Pt reports minimal vomiting and "pasty" diarrhea

## 2019-05-28 NOTE — ED Notes (Signed)
CT contacted & told that pt is ready for her scan.

## 2019-05-29 ENCOUNTER — Observation Stay (HOSPITAL_COMMUNITY): Payer: Medicare Other

## 2019-05-29 DIAGNOSIS — Z8551 Personal history of malignant neoplasm of bladder: Secondary | ICD-10-CM | POA: Diagnosis not present

## 2019-05-29 DIAGNOSIS — Z79899 Other long term (current) drug therapy: Secondary | ICD-10-CM | POA: Diagnosis not present

## 2019-05-29 DIAGNOSIS — Z906 Acquired absence of other parts of urinary tract: Secondary | ICD-10-CM | POA: Diagnosis not present

## 2019-05-29 DIAGNOSIS — Z79890 Hormone replacement therapy (postmenopausal): Secondary | ICD-10-CM | POA: Diagnosis not present

## 2019-05-29 DIAGNOSIS — Z90710 Acquired absence of both cervix and uterus: Secondary | ICD-10-CM | POA: Diagnosis not present

## 2019-05-29 DIAGNOSIS — Z91040 Latex allergy status: Secondary | ICD-10-CM | POA: Diagnosis not present

## 2019-05-29 DIAGNOSIS — Z20828 Contact with and (suspected) exposure to other viral communicable diseases: Secondary | ICD-10-CM | POA: Diagnosis present

## 2019-05-29 DIAGNOSIS — Z885 Allergy status to narcotic agent status: Secondary | ICD-10-CM | POA: Diagnosis not present

## 2019-05-29 DIAGNOSIS — E785 Hyperlipidemia, unspecified: Secondary | ICD-10-CM | POA: Diagnosis present

## 2019-05-29 DIAGNOSIS — K56609 Unspecified intestinal obstruction, unspecified as to partial versus complete obstruction: Secondary | ICD-10-CM | POA: Diagnosis present

## 2019-05-29 DIAGNOSIS — Z87891 Personal history of nicotine dependence: Secondary | ICD-10-CM | POA: Diagnosis not present

## 2019-05-29 DIAGNOSIS — K56600 Partial intestinal obstruction, unspecified as to cause: Secondary | ICD-10-CM | POA: Diagnosis not present

## 2019-05-29 DIAGNOSIS — Z888 Allergy status to other drugs, medicaments and biological substances status: Secondary | ICD-10-CM | POA: Diagnosis not present

## 2019-05-29 DIAGNOSIS — Z8719 Personal history of other diseases of the digestive system: Secondary | ICD-10-CM | POA: Diagnosis not present

## 2019-05-29 DIAGNOSIS — R109 Unspecified abdominal pain: Secondary | ICD-10-CM | POA: Diagnosis not present

## 2019-05-29 DIAGNOSIS — Z9049 Acquired absence of other specified parts of digestive tract: Secondary | ICD-10-CM | POA: Diagnosis not present

## 2019-05-29 DIAGNOSIS — F419 Anxiety disorder, unspecified: Secondary | ICD-10-CM | POA: Diagnosis present

## 2019-05-29 LAB — CBC
HCT: 34.2 % — ABNORMAL LOW (ref 36.0–46.0)
Hemoglobin: 11.1 g/dL — ABNORMAL LOW (ref 12.0–15.0)
MCH: 30.1 pg (ref 26.0–34.0)
MCHC: 32.5 g/dL (ref 30.0–36.0)
MCV: 92.7 fL (ref 80.0–100.0)
Platelets: 153 10*3/uL (ref 150–400)
RBC: 3.69 MIL/uL — ABNORMAL LOW (ref 3.87–5.11)
RDW: 13.3 % (ref 11.5–15.5)
WBC: 4.5 10*3/uL (ref 4.0–10.5)
nRBC: 0 % (ref 0.0–0.2)

## 2019-05-29 LAB — BASIC METABOLIC PANEL
Anion gap: 5 (ref 5–15)
BUN: 11 mg/dL (ref 8–23)
CO2: 27 mmol/L (ref 22–32)
Calcium: 8.3 mg/dL — ABNORMAL LOW (ref 8.9–10.3)
Chloride: 110 mmol/L (ref 98–111)
Creatinine, Ser: 0.76 mg/dL (ref 0.44–1.00)
GFR calc Af Amer: >60 mL/min (ref >60)
GFR calc non Af Amer: >60 mL/min (ref >60)
Glucose, Bld: 108 mg/dL — ABNORMAL HIGH (ref 70–99)
Potassium: 3.9 mmol/L (ref 3.5–5.1)
Sodium: 142 mmol/L (ref 135–145)

## 2019-05-29 MED ORDER — ACETAMINOPHEN 500 MG PO TABS
1000.0000 mg | ORAL_TABLET | Freq: Four times a day (QID) | ORAL | Status: DC | PRN
  Administered 2019-05-29 – 2019-05-31 (×7): 1000 mg via ORAL
  Filled 2019-05-29 (×6): qty 2

## 2019-05-29 NOTE — Progress Notes (Signed)
Patient ID: Christina Campos, female   DOB: 1951-07-11, 68 y.o.   MRN: [id number] [facility] Surgery Progress Note:   * No surgery found *  Subjective: Mental status is clear and without complaints Objective: Vital signs in last 24 hours: Temp:  [97.6 F (36.4 C)-98 F (36.7 C)] 97.6 F (36.4 C) (07/03 0959) Pulse Rate:  [64-85] 70 (07/03 0959) Resp:  [10-18] 18 (07/03 0959) BP: (101-152)/(56-79) 112/62 (07/03 0959) SpO2:  [94 %-100 %] 96 % (07/03 0959) Weight:  [72.6 kg] 72.6 kg (07/02 1029)  Intake/Output from previous day: 07/02 0701 - 07/03 0700 In: 2000.7 [I.V.:1000.7; IV Piggyback:1000] Out: 500 [Urine:500] Intake/Output this shift: No intake/output data recorded.  Physical Exam: Work of breathing is normal;  Passing flatus-no nausea   Lab Results:  Results for orders placed or performed during the hospital encounter of 05/28/19 (from the past 48 hour(s))  Urinalysis, Routine w reflex microscopic     Status: Abnormal   Collection Time: 05/28/19 10:42 AM  Result Value Ref Range   Color, Urine STRAW (A) YELLOW   APPearance CLEAR CLEAR   Specific Gravity, Urine 1.005 1.005 - 1.030   pH 7.0 5.0 - 8.0   Glucose, UA NEGATIVE NEGATIVE mg/dL   Hgb urine dipstick NEGATIVE NEGATIVE   Bilirubin Urine NEGATIVE NEGATIVE   Ketones, ur NEGATIVE NEGATIVE mg/dL   Protein, ur NEGATIVE NEGATIVE mg/dL   Nitrite NEGATIVE NEGATIVE   Leukocytes,Ua NEGATIVE NEGATIVE    Comment: Performed at [facility] [address], [place], [place] [address]  Comprehensive metabolic panel     Status: Abnormal   Collection Time: 05/28/19 11:00 AM  Result Value Ref Range   Sodium 139 135 - 145 mmol/L   Potassium 4.0 3.5 - 5.1 mmol/L   Chloride 105 98 - 111 mmol/L   CO2 23 22 - 32 mmol/L   Glucose, Bld 127 (H) 70 - 99 mg/dL   BUN 15 8 - 23 mg/dL   Creatinine, Ser 0.88 0.44 - 1.00 mg/dL   Calcium 9.6 8.9 - 10.3 mg/dL   Total Protein 7.5 6.5 - 8.1 g/dL   Albumin 4.3 3.5 - 5.0 g/dL   AST  19 15 - 41 U/L   ALT 17 0 - 44 U/L   Alkaline Phosphatase 101 38 - 126 U/L   Total Bilirubin 0.6 0.3 - 1.2 mg/dL   GFR calc non Af Amer >60 >60 mL/min   GFR calc Af Amer >60 >60 mL/min   Anion gap 11 5 - 15    Comment: Performed at [facility], [place] [address] [place], [place] [address]  Lipase, blood     Status: None   Collection Time: 05/28/19 11:00 AM  Result Value Ref Range   Lipase 34 11 - 51 U/L    Comment: Performed at [facility] [address], [place], [place] [address]  CBC with Differential     Status: Abnormal   Collection Time: 05/28/19 11:00 AM  Result Value Ref Range   WBC 11.5 (H) 4.0 - 10.5 K/uL   RBC 4.90 3.87 - 5.11 MIL/uL   Hemoglobin 14.4 12.0 - 15.0 g/dL   HCT 44.9 36.0 - 46.0 %   MCV 91.6 80.0 - 100.0 fL   MCH 29.4 26.0 - 34.0 pg   MCHC 32.1 30.0 - 36.0 g/dL   RDW 13.2 11.5 - 15.5 %   Platelets 219 150 - 400 K/uL   nRBC 0.0 0.0 - 0.2 %  Neutrophils Relative % 87 %   Neutro Abs 9.9 (H) 1.7 - 7.7 K/uL   Lymphocytes Relative 10 %   Lymphs Abs 1.1 0.7 - 4.0 K/uL   Monocytes Relative 3 %   Monocytes Absolute 0.4 0.1 - 1.0 K/uL   Eosinophils Relative 0 %   Eosinophils Absolute 0.0 0.0 - 0.5 K/uL   Basophils Relative 0 %   Basophils Absolute 0.0 0.0 - 0.1 K/uL   Immature Granulocytes 0 %   Abs Immature Granulocytes 0.04 0.00 - 0.07 K/uL    Comment: Performed at [facility] [address] [place], [place] [address]  SARS Coronavirus 2 (CEPHEID - Performed in [facility] hospital lab), Hosp Order     Status: None   Collection Time: 05/28/19  3:04 PM   Specimen: Nasopharyngeal Swab  Result Value Ref Range   SARS Coronavirus 2 NEGATIVE NEGATIVE    Comment: (NOTE) If result is NEGATIVE SARS-CoV-2 target nucleic acids are NOT DETECTED. The SARS-CoV-2 RNA is generally detectable in upper and lower  respiratory specimens during the acute phase of infection. The lowest  concentration of SARS-CoV-2 viral copies this assay can detect is  250  copies / mL. A negative result does not preclude SARS-CoV-2 infection  and should not be used as the sole basis for treatment or other  patient management decisions.  A negative result may occur with  improper specimen collection / handling, submission of specimen other  than nasopharyngeal swab, presence of viral mutation(s) within the  areas targeted by this assay, and inadequate number of viral copies  (<250 copies / mL). A negative result must be combined with clinical  observations, patient history, and epidemiological information. If result is POSITIVE SARS-CoV-2 target nucleic acids are DETECTED. The SARS-CoV-2 RNA is generally detectable in upper and lower  respiratory specimens dur ing the acute phase of infection.  Positive  results are indicative of active infection with SARS-CoV-2.  Clinical  correlation with patient history and other diagnostic information is  necessary to determine patient infection status.  Positive results do  not rule out bacterial infection or co-infection with other viruses. If result is PRESUMPTIVE POSTIVE SARS-CoV-2 nucleic acids MAY BE PRESENT.   A presumptive positive result was obtained on the submitted specimen  and confirmed on repeat testing.  While 2019 novel coronavirus  (SARS-CoV-2) nucleic acids may be present in the submitted sample  additional confirmatory testing may be necessary for epidemiological  and / or clinical management purposes  to differentiate between  SARS-CoV-2 and other Sarbecovirus currently known to infect humans.  If clinically indicated additional testing with an alternate test  methodology [phone]) is advised. The SARS-CoV-2 RNA is generally  detectable in upper and lower respiratory sp ecimens during the acute  phase of infection. The expected result is Negative. Fact Sheet for Patients:  StrictlyIdeas.no Fact Sheet for Healthcare  Providers: BankingDealers.co.za This test is not yet approved or cleared by the [place] FDA and has been authorized for detection and/or diagnosis of SARS-CoV-2 by FDA under an Emergency Use Authorization (EUA).  This EUA will remain in effect (meaning this test can be used) for the duration of the COVID-19 declaration under Section 564(b)(1) of the Act, 21 U.S.C. section 360bbb-3(b)(1), unless the authorization is terminated or revoked sooner. Performed at [facility], [place] [address] [place], [place] [address]   Lactic acid, plasma     Status: None   Collection Time: 05/28/19  3:10 PM  Result Value  Ref Range   Lactic Acid, Venous 0.8 0.5 - 1.9 mmol/L    Comment: Performed at [facility] [address] [place], [place] [address]  Basic metabolic panel     Status: Abnormal   Collection Time: 05/29/19  3:59 AM  Result Value Ref Range   Sodium 142 135 - 145 mmol/L   Potassium 3.9 3.5 - 5.1 mmol/L   Chloride 110 98 - 111 mmol/L   CO2 27 22 - 32 mmol/L   Glucose, Bld 108 (H) 70 - 99 mg/dL   BUN 11 8 - 23 mg/dL   Creatinine, Ser 0.76 0.44 - 1.00 mg/dL   Calcium 8.3 (L) 8.9 - 10.3 mg/dL   GFR calc non Af Amer >60 >60 mL/min   GFR calc Af Amer >60 >60 mL/min   Anion gap 5 5 - 15    Comment: Performed at [facility], [place] [address], [place], [place] [address]  CBC     Status: Abnormal   Collection Time: 05/29/19  3:59 AM  Result Value Ref Range   WBC 4.5 4.0 - 10.5 K/uL   RBC 3.69 (L) 3.87 - 5.11 MIL/uL   Hemoglobin 11.1 (L) 12.0 - 15.0 g/dL    Comment: REPEATED TO VERIFY DELTA CHECK NOTED    HCT 34.2 (L) 36.0 - 46.0 %   MCV 92.7 80.0 - 100.0 fL   MCH 30.1 26.0 - 34.0 pg   MCHC 32.5 30.0 - 36.0 g/dL   RDW 13.3 11.5 - 15.5 %   Platelets 153 150 - 400 K/uL   nRBC 0.0 0.0 - 0.2 %    Comment: Performed at [facility], [place] [address], [place], [place] [address]    Radiology/Results: Ct Abdomen Pelvis W Contrast  Result  Date: 05/28/2019 CLINICAL DATA:  Abdominal pain, history of cholecystectomy history of bladder cancer EXAM: CT ABDOMEN AND PELVIS WITH CONTRAST TECHNIQUE: Multidetector CT imaging of the abdomen and pelvis was performed using the standard protocol following bolus administration of intravenous contrast. CONTRAST:  125mL ISOVUE-300 IOPAMIDOL (ISOVUE-300) INJECTION 61% COMPARISON:  09/01/2014 FINDINGS: Lower chest: No acute abnormality. Hepatobiliary: No solid liver abnormality is seen. No gallstones, gallbladder wall thickening, or biliary dilatation. Pancreas: Unremarkable. No pancreatic ductal dilatation or surrounding inflammatory changes. Spleen: Normal in size without significant abnormality. Adrenals/Urinary Tract: Adrenal glands are unremarkable. Kidneys are normal, without renal calculi, solid lesion, or hydronephrosis. Bladder is unremarkable. Stomach/Bowel: Stomach is within normal limits. Appendix appears normal. The distal small bowel is diffusely fluid filled with multiple segments of thickening, narrowed appearing bowel in the low abdomen and pelvis (series 3, image 80, series 6, image 75). Severe descending and sigmoid diverticulosis. Vascular/Lymphatic: Aortic atherosclerosis. No enlarged abdominal or pelvic lymph nodes. Reproductive: Status post hysterectomy. Other: Small, fat containing low midline ventral hernia (series 3, image 60). Small volume nonspecific free fluid in the low abdomen and pelvis. Musculoskeletal: No acute or significant osseous findings. IMPRESSION: 1. The distal small bowel is diffusely fluid filled with multiple segments of thickening, narrowed appearing bowel in the low abdomen and pelvis (series 3, image 80, series 6, image 75). This appearance is generally suspicious for bowel stricture, adhesion, and/or partial obstruction, and in keeping with appearance on prior CT dated 2015. Differential considerations include inflammatory bowel disease such as Crohn's disease. Correlate  with referable clinical history, if present. 2. Small volume nonspecific free fluid in the low abdomen and pelvis, of uncertain etiology. 3.  Postoperative findings of cholecystectomy and  hysterectomy. 4.  Normal appendix. Electronically Signed   By: Eddie Candle M.D.   On: 05/28/2019 13:43   Dg Abd Portable 1v-small Bowel Obstruction Protocol-initial, 8 Hr Delay  Result Date: 05/28/2019 CLINICAL DATA:  Small-bowel obstruction EXAM: PORTABLE ABDOMEN - 1 VIEW COMPARISON:  CT 05/28/2019 FINDINGS: Small amount of retained contrast within the stomach. Contrast is present throughout the small bowel without significant distention of small bowel loops. Contrast filled loops of pelvic bowel, likely reflect dilated small bowel as noted on CT. No definitive contrast within the colon. There is excreted contrast in the bladder. IMPRESSION: Diffuse enteral contrast within the stomach and small bowel, majority of small bowel does not appear distended. Slightly dilated appearing contrast is present within probable dilated small bowel in the pelvis measuring up to 3.7 cm. No definitive colon contrast is noted. Electronically Signed   By: Donavan Foil M.D.   On: 05/28/2019 19:27    Anti-infectives: Anti-infectives (From admission, onward)   None      Assessment/Plan: Problem List: Patient Active Problem List   Diagnosis Date Noted  . SBO (small bowel obstruction) ([place]) 09/01/2014  . Asthma, chronic 09/01/2014  . Leukocytosis 09/01/2014  . Hyperglycemia 09/01/2014  . PVC (premature ventricular contraction) 09/02/2013  . Chest tightness 09/02/2013  . HTN (hypertension) 09/02/2013  . Hypercholesterolemia 09/02/2013    Xray from today showed contrast in colon;  Begun on clear liquids * No surgery found *    LOS: 0 days   Matt B. Hassell Done, MD, [facility] [phone] beeper [phone]  05/29/2019 10:02 AM

## 2019-05-29 NOTE — Plan of Care (Signed)

## 2019-05-29 NOTE — Progress Notes (Signed)
Called Radiology because 8 hour delayed film was not done at 0330, they will come up and do it now.

## 2019-05-30 NOTE — Progress Notes (Signed)
Patient ID: Christina Campos, female   DOB: 1951/10/21, 68 y.o.   MRN: [id number] [facility] Surgery Progress Note:   * No surgery found *  Subjective: Mental status is clear.  Sitting up.  Had some cramps and blow out diarrhea last night;   Objective: Vital signs in last 24 hours: Temp:  [97.6 F (36.4 C)-98.6 F (37 C)] 97.9 F (36.6 C) (07/04 0435) Pulse Rate:  [62-70] 64 (07/04 0435) Resp:  [16-18] 18 (07/04 0435) BP: (111-120)/(54-65) 117/54 (07/04 0435) SpO2:  [94 %-98 %] 94 % (07/04 0435)  Intake/Output from previous day: 07/03 0701 - 07/04 0700 In: 150 [P.O.:150] Out: 500 [Urine:500] Intake/Output this shift: No intake/output data recorded.  Physical Exam: Work of breathing is not labored.  Some cramps  Lab Results:  Results for orders placed or performed during the hospital encounter of 05/28/19 (from the past 48 hour(s))  Urinalysis, Routine w reflex microscopic     Status: Abnormal   Collection Time: 05/28/19 10:42 AM  Result Value Ref Range   Color, Urine STRAW (A) YELLOW   APPearance CLEAR CLEAR   Specific Gravity, Urine 1.005 1.005 - 1.030   pH 7.0 5.0 - 8.0   Glucose, UA NEGATIVE NEGATIVE mg/dL   Hgb urine dipstick NEGATIVE NEGATIVE   Bilirubin Urine NEGATIVE NEGATIVE   Ketones, ur NEGATIVE NEGATIVE mg/dL   Protein, ur NEGATIVE NEGATIVE mg/dL   Nitrite NEGATIVE NEGATIVE   Leukocytes,Ua NEGATIVE NEGATIVE    Comment: Performed at [facility] [address] [place], [place] [address]  Comprehensive metabolic panel     Status: Abnormal   Collection Time: 05/28/19 11:00 AM  Result Value Ref Range   Sodium 139 135 - 145 mmol/L   Potassium 4.0 3.5 - 5.1 mmol/L   Chloride 105 98 - 111 mmol/L   CO2 23 22 - 32 mmol/L   Glucose, Bld 127 (H) 70 - 99 mg/dL   BUN 15 8 - 23 mg/dL   Creatinine, Ser 0.88 0.44 - 1.00 mg/dL   Calcium 9.6 8.9 - 10.3 mg/dL   Total Protein 7.5 6.5 - 8.1 g/dL   Albumin 4.3 3.5 - 5.0 g/dL   AST 19 15 - 41 U/L   ALT 17 0 - 44  U/L   Alkaline Phosphatase 101 38 - 126 U/L   Total Bilirubin 0.6 0.3 - 1.2 mg/dL   GFR calc non Af Amer >60 >60 mL/min   GFR calc Af Amer >60 >60 mL/min   Anion gap 11 5 - 15    Comment: Performed at [facility], [place] [address], [place], [place] [address]  Lipase, blood     Status: None   Collection Time: 05/28/19 11:00 AM  Result Value Ref Range   Lipase 34 11 - 51 U/L    Comment: Performed at [facility] [address], [place], [place] [address]  CBC with Differential     Status: Abnormal   Collection Time: 05/28/19 11:00 AM  Result Value Ref Range   WBC 11.5 (H) 4.0 - 10.5 K/uL   RBC 4.90 3.87 - 5.11 MIL/uL   Hemoglobin 14.4 12.0 - 15.0 g/dL   HCT 44.9 36.0 - 46.0 %   MCV 91.6 80.0 - 100.0 fL   MCH 29.4 26.0 - 34.0 pg   MCHC 32.1 30.0 - 36.0 g/dL   RDW 13.2 11.5 - 15.5 %   Platelets 219 150 - 400 K/uL   nRBC 0.0 0.0 - 0.2 %  Neutrophils Relative % 87 %   Neutro Abs 9.9 (H) 1.7 - 7.7 K/uL   Lymphocytes Relative 10 %   Lymphs Abs 1.1 0.7 - 4.0 K/uL   Monocytes Relative 3 %   Monocytes Absolute 0.4 0.1 - 1.0 K/uL   Eosinophils Relative 0 %   Eosinophils Absolute 0.0 0.0 - 0.5 K/uL   Basophils Relative 0 %   Basophils Absolute 0.0 0.0 - 0.1 K/uL   Immature Granulocytes 0 %   Abs Immature Granulocytes 0.04 0.00 - 0.07 K/uL    Comment: Performed at [facility] [address] [place], [place] [address]  SARS Coronavirus 2 (CEPHEID - Performed in [facility] hospital lab), Hosp Order     Status: None   Collection Time: 05/28/19  3:04 PM   Specimen: Nasopharyngeal Swab  Result Value Ref Range   SARS Coronavirus 2 NEGATIVE NEGATIVE    Comment: (NOTE) If result is NEGATIVE SARS-CoV-2 target nucleic acids are NOT DETECTED. The SARS-CoV-2 RNA is generally detectable in upper and lower  respiratory specimens during the acute phase of infection. The lowest  concentration of SARS-CoV-2 viral copies this assay can detect is 250  copies / mL. A negative  result does not preclude SARS-CoV-2 infection  and should not be used as the sole basis for treatment or other  patient management decisions.  A negative result may occur with  improper specimen collection / handling, submission of specimen other  than nasopharyngeal swab, presence of viral mutation(s) within the  areas targeted by this assay, and inadequate number of viral copies  (<250 copies / mL). A negative result must be combined with clinical  observations, patient history, and epidemiological information. If result is POSITIVE SARS-CoV-2 target nucleic acids are DETECTED. The SARS-CoV-2 RNA is generally detectable in upper and lower  respiratory specimens dur ing the acute phase of infection.  Positive  results are indicative of active infection with SARS-CoV-2.  Clinical  correlation with patient history and other diagnostic information is  necessary to determine patient infection status.  Positive results do  not rule out bacterial infection or co-infection with other viruses. If result is PRESUMPTIVE POSTIVE SARS-CoV-2 nucleic acids MAY BE PRESENT.   A presumptive positive result was obtained on the submitted specimen  and confirmed on repeat testing.  While 2019 novel coronavirus  (SARS-CoV-2) nucleic acids may be present in the submitted sample  additional confirmatory testing may be necessary for epidemiological  and / or clinical management purposes  to differentiate between  SARS-CoV-2 and other Sarbecovirus currently known to infect humans.  If clinically indicated additional testing with an alternate test  methodology [phone]) is advised. The SARS-CoV-2 RNA is generally  detectable in upper and lower respiratory sp ecimens during the acute  phase of infection. The expected result is Negative. Fact Sheet for Patients:  StrictlyIdeas.no Fact Sheet for Healthcare Providers: BankingDealers.co.za This test is not yet  approved or cleared by the [place] FDA and has been authorized for detection and/or diagnosis of SARS-CoV-2 by FDA under an Emergency Use Authorization (EUA).  This EUA will remain in effect (meaning this test can be used) for the duration of the COVID-19 declaration under Section 564(b)(1) of the Act, 21 U.S.C. section 360bbb-3(b)(1), unless the authorization is terminated or revoked sooner. Performed at [facility], [place] [address], [place], [place] [address]   Lactic acid, plasma     Status: None   Collection Time: 05/28/19  3:10 PM  Result Value  Ref Range   Lactic Acid, Venous 0.8 0.5 - 1.9 mmol/L    Comment: Performed at [facility] [address], [place], [place] [address]  Basic metabolic panel     Status: Abnormal   Collection Time: 05/29/19  3:59 AM  Result Value Ref Range   Sodium 142 135 - 145 mmol/L   Potassium 3.9 3.5 - 5.1 mmol/L   Chloride 110 98 - 111 mmol/L   CO2 27 22 - 32 mmol/L   Glucose, Bld 108 (H) 70 - 99 mg/dL   BUN 11 8 - 23 mg/dL   Creatinine, Ser 0.76 0.44 - 1.00 mg/dL   Calcium 8.3 (L) 8.9 - 10.3 mg/dL   GFR calc non Af Amer >60 >60 mL/min   GFR calc Af Amer >60 >60 mL/min   Anion gap 5 5 - 15    Comment: Performed at [facility], [place] [address] [place], [place] [address]  CBC     Status: Abnormal   Collection Time: 05/29/19  3:59 AM  Result Value Ref Range   WBC 4.5 4.0 - 10.5 K/uL   RBC 3.69 (L) 3.87 - 5.11 MIL/uL   Hemoglobin 11.1 (L) 12.0 - 15.0 g/dL    Comment: REPEATED TO VERIFY DELTA CHECK NOTED    HCT 34.2 (L) 36.0 - 46.0 %   MCV 92.7 80.0 - 100.0 fL   MCH 30.1 26.0 - 34.0 pg   MCHC 32.5 30.0 - 36.0 g/dL   RDW 13.3 11.5 - 15.5 %   Platelets 153 150 - 400 K/uL   nRBC 0.0 0.0 - 0.2 %    Comment: Performed at [facility], [place] [address] [place], [place] [address]    Radiology/Results: Ct Abdomen Pelvis W Contrast  Result Date: 05/28/2019 CLINICAL DATA:  Abdominal pain, history of cholecystectomy  history of bladder cancer EXAM: CT ABDOMEN AND PELVIS WITH CONTRAST TECHNIQUE: Multidetector CT imaging of the abdomen and pelvis was performed using the standard protocol following bolus administration of intravenous contrast. CONTRAST:  112mL ISOVUE-300 IOPAMIDOL (ISOVUE-300) INJECTION 61% COMPARISON:  09/01/2014 FINDINGS: Lower chest: No acute abnormality. Hepatobiliary: No solid liver abnormality is seen. No gallstones, gallbladder wall thickening, or biliary dilatation. Pancreas: Unremarkable. No pancreatic ductal dilatation or surrounding inflammatory changes. Spleen: Normal in size without significant abnormality. Adrenals/Urinary Tract: Adrenal glands are unremarkable. Kidneys are normal, without renal calculi, solid lesion, or hydronephrosis. Bladder is unremarkable. Stomach/Bowel: Stomach is within normal limits. Appendix appears normal. The distal small bowel is diffusely fluid filled with multiple segments of thickening, narrowed appearing bowel in the low abdomen and pelvis (series 3, image 80, series 6, image 75). Severe descending and sigmoid diverticulosis. Vascular/Lymphatic: Aortic atherosclerosis. No enlarged abdominal or pelvic lymph nodes. Reproductive: Status post hysterectomy. Other: Small, fat containing low midline ventral hernia (series 3, image 60). Small volume nonspecific free fluid in the low abdomen and pelvis. Musculoskeletal: No acute or significant osseous findings. IMPRESSION: 1. The distal small bowel is diffusely fluid filled with multiple segments of thickening, narrowed appearing bowel in the low abdomen and pelvis (series 3, image 80, series 6, image 75). This appearance is generally suspicious for bowel stricture, adhesion, and/or partial obstruction, and in keeping with appearance on prior CT dated 2015. Differential considerations include inflammatory bowel disease such as Crohn's disease. Correlate with referable clinical history, if present. 2. Small volume nonspecific  free fluid in the low abdomen and pelvis, of uncertain etiology. 3.  Postoperative findings of cholecystectomy and  hysterectomy. 4.  Normal appendix. Electronically Signed   By: Eddie Candle M.D.   On: 05/28/2019 13:43   Dg Abd Portable 1v-small Bowel Obstruction Protocol-24 Hr Delay  Result Date: 05/29/2019 CLINICAL DATA:  Small bowel obstruction. EXAM: PORTABLE ABDOMEN - 1 VIEW COMPARISON:  May 28, 2019. FINDINGS: The bowel gas pattern is normal. Radiopaque contrast is identified throughout colon. There is diverticulosis of colon. Prior cholecystectomy clips are noted. IMPRESSION: Radiopaque contrast is identified throughout colon. There is no evidence of small bowel obstruction. Electronically Signed   By: Abelardo Diesel M.D.   On: 05/29/2019 12:06   Dg Abd Portable 1v-small Bowel Obstruction Protocol-initial, 8 Hr Delay  Result Date: 05/28/2019 CLINICAL DATA:  Small-bowel obstruction EXAM: PORTABLE ABDOMEN - 1 VIEW COMPARISON:  CT 05/28/2019 FINDINGS: Small amount of retained contrast within the stomach. Contrast is present throughout the small bowel without significant distention of small bowel loops. Contrast filled loops of pelvic bowel, likely reflect dilated small bowel as noted on CT. No definitive contrast within the colon. There is excreted contrast in the bladder. IMPRESSION: Diffuse enteral contrast within the stomach and small bowel, majority of small bowel does not appear distended. Slightly dilated appearing contrast is present within probable dilated small bowel in the pelvis measuring up to 3.7 cm. No definitive colon contrast is noted. Electronically Signed   By: Donavan Foil M.D.   On: 05/28/2019 19:27    Anti-infectives: Anti-infectives (From admission, onward)   None      Assessment/Plan: Problem List: Patient Active Problem List   Diagnosis Date Noted  . SBO (small bowel obstruction) ([place]) 09/01/2014  . Asthma, chronic 09/01/2014  . Leukocytosis 09/01/2014  .  Hyperglycemia 09/01/2014  . PVC (premature ventricular contraction) 09/02/2013  . Chest tightness 09/02/2013  . HTN (hypertension) 09/02/2013  . Hypercholesterolemia 09/02/2013    Will advance to full liquids today;  If she tolerates, she could go home in AM * No surgery found *    LOS: 1 day   Matt B. Hassell Done, MD, [facility] [phone] beeper [phone]  05/30/2019 9:15 AM

## 2019-05-30 NOTE — Plan of Care (Signed)
  Problem: Education: Goal: Knowledge of General Education information will improve Description Including pain rating scale, medication(s)/side effects and non-pharmacologic comfort measures Outcome: Progressing   

## 2019-05-31 NOTE — Discharge Summary (Signed)
Physician Discharge Summary  Patient ID: Christina Campos MRN: [id number] DOB/AGE: [birth year] 68 y.o.  PCP: Lavone Orn, MD  Admit date: 05/28/2019 Discharge date: 05/31/2019  Admission Diagnoses:  Small bowel obstruction  Discharge Diagnoses:  same  Active Problems:   SBO (small bowel obstruction) [facility])   Surgery:  none  Discharged Condition: improved  Hospital Course:   Admitted with SBO;  Small bowel protocol with contrast going to colon and flatus and BMs following.  Tolerating full liquids and ready for discharge.    Consults: none  Significant Diagnostic Studies: none    Discharge Exam: Blood pressure (!) 108/59, pulse 72, temperature (!) 97.4 F (36.3 C), temperature source Oral, resp. rate 17, height 5\' 7"  (1.702 m), weight 72.6 kg, SpO2 96 %. No abdominal pain at present  Disposition: Discharge disposition: 01-Home or Self Care       Discharge Instructions    Call MD for:  persistant nausea and vomiting   Complete by: As directed    Call MD for:  severe uncontrolled pain   Complete by: As directed    Diet - low sodium heart healthy   Complete by: As directed    Increase activity slowly   Complete by: As directed      Allergies as of 05/31/2019      Reactions   Codeine Shortness Of Breath, Swelling   Latex Itching   Lipitor [atorvastatin] Other (See Comments)   confusion   Lisinopril Other (See Comments)   cough   Onion Nausea And Vomiting   Raw Acute pain in gastric area   Wellbutrin [bupropion] Other (See Comments)   Worsened anxiety   Zocor [simvastatin] Other (See Comments)   Confusion/ sleepy   Zoloft [sertraline] Other (See Comments)   Worsened anxiety      Medication List    TAKE these medications   acetaminophen 325 MG tablet Commonly known as: TYLENOL Take 650 mg by mouth every 6 (six) hours as needed.   estrogens (conjugated) 0.625 MG tablet Commonly known as: PREMARIN Take 0.625 mg by mouth every evening.   ibuprofen 200  MG tablet Commonly known as: ADVIL Take 400 mg by mouth every 6 (six) hours as needed for pain.      Follow-up Information    Lavone Orn, MD.   Specialty: Internal Medicine Contact information: [address] [place] [address] [place].   Specialty: Emergency Medicine Why: Return to ED with new or worsening symptoms or concerns Contact information: [address] [id number] [place]          Signed: Pedro Earls 05/31/2019, 9:34 AM

## 2019-05-31 NOTE — Discharge Instructions (Signed)
Bowel Obstruction °A bowel obstruction means that something is blocking the small or large bowel. The bowel is also called the intestine. It is the long tube that connects the stomach to the opening of the butt (anus). When something blocks the bowel, food and fluids cannot pass through like normal. This condition needs to be treated. Treatment depends on the cause of the problem and how bad the problem is. °What are the causes? °Common causes of this condition include: °· Scar tissue (adhesions) from past surgery or from high-energy X-rays (radiation). °· Recent surgery in the belly. This affects how food moves in the bowel. °· Some diseases, such as: °? Irritation of the lining of the digestive tract (Crohn's disease). °? Irritation of small pouches in the bowel (diverticulitis). °· Growths or tumors. °· A bulging organ (hernia). °· Twisting of the bowel (volvulus). °· A foreign body. °· Slipping of a part of the bowel into another part (intussusception). °What are the signs or symptoms? °Symptoms of this condition include: °· Pain in the belly. °· Feeling sick to your stomach (nauseous). °· Throwing up (vomiting). °· Bloating in the belly. °· Being unable to pass gas. °· Trouble pooping (constipation). °· Watery poop (diarrhea). °· A lot of belching. °How is this diagnosed? °This condition may be diagnosed based on: °· A physical exam. °· Medical history. °· Imaging tests, such as X-ray or CT scan. °· Blood tests. °· Urine tests. °How is this treated? °Treatment for this condition may include: °· Fluids and pain medicines that are given through an IV tube. Your doctor may tell you not to eat or drink if you feel sick to your stomach and are throwing up. °· Eating a clear liquid diet for a few days. °· Putting a small tube (nasogastric tube) into the stomach. This will help with pain, discomfort, and nausea by removing blocked air and fluids from the stomach. °· Surgery. This may be needed if other treatments do  not work. °Follow these instructions at home: °Medicines °· Take over-the-counter and prescription medicines only as told by your doctor. °· If you were prescribed an antibiotic medicine, take it as told by your doctor. Do not stop taking the antibiotic even if you start to feel better. °General instructions °· Follow your diet as told by your doctor. You may need to: °? Only drink clear liquids until you start to get better. °? Avoid solid foods. °· Return to your normal activities as told by your doctor. Ask your doctor what activities are safe for you. °· Do not sit for a long time without moving. Get up to take short walks every 1-2 hours. This is important. Ask for help if you feel weak or unsteady. °· Keep all follow-up visits as told by your doctor. This is important. °How is this prevented? °After having a bowel obstruction, you may be more likely to have another. You can do some things to stop it from happening again. °· If you have a long-term (chronic) disease, contact your doctor if you see changes or problems. °· Take steps to prevent or treat trouble pooping. Your doctor may ask that you: °? Drink enough fluid to keep your pee (urine) pale yellow. °? Take over-the-counter or prescription medicines. °? Eat foods that are high in fiber. These include beans, whole grains, and fresh fruits and vegetables. °? Limit foods that are high in fat and sugar. These include fried or sweet foods. °· Stay active. Ask your doctor which exercises are   safe for you. °· Avoid stress. °· Eat three small meals and three small snacks each day. °· Work with a food expert (dietitian) to make a meal plan that works for you. °· Do not use any products that contain nicotine or tobacco, such as cigarettes and e-cigarettes. If you need help quitting, ask your doctor. °Contact a doctor if: °· You have a fever. °· You have chills. °Get help right away if: °· You have pain or cramps that get worse. °· You throw up blood. °· You are  sick to your stomach. °· You cannot stop throwing up. °· You cannot drink fluids. °· You feel mixed up (confused). °· You feel very thirsty (dehydrated). °· Your belly gets more bloated. °· You feel weak or you pass out (faint). °Summary °· A bowel obstruction means that something is blocking the small or large bowel. °· Treatment may include IV fluids and pain medicine. You may also have a clear liquid diet, a small tube in your stomach, or surgery. °· Drink clear liquids and avoid solid foods until you get better. °This information is not intended to replace advice given to you by your health care provider. Make sure you discuss any questions you have with your health care provider. °Document Released: 12/20/2004 Document Revised: 03/26/2018 Document Reviewed: 03/26/2018 °Elsevier Patient Education © 2020 [employer] ° °

## 2019-06-03 DIAGNOSIS — F419 Anxiety disorder, unspecified: Secondary | ICD-10-CM | POA: Diagnosis not present

## 2019-06-05 ENCOUNTER — Other Ambulatory Visit: Payer: Self-pay

## 2019-06-05 ENCOUNTER — Ambulatory Visit
Admission: RE | Admit: 2019-06-05 | Discharge: 2019-06-05 | Disposition: A | Discharge status: Home / Self Care | Payer: Medicare Other | Source: Ambulatory Visit | Attending: Internal Medicine | Admitting: Internal Medicine

## 2019-06-05 DIAGNOSIS — Z13820 Encounter for screening for osteoporosis: Secondary | ICD-10-CM

## 2019-06-05 DIAGNOSIS — Z1231 Encounter for screening mammogram for malignant neoplasm of breast: Secondary | ICD-10-CM

## 2019-06-10 DIAGNOSIS — F419 Anxiety disorder, unspecified: Secondary | ICD-10-CM | POA: Diagnosis not present

## 2019-06-18 DIAGNOSIS — F419 Anxiety disorder, unspecified: Secondary | ICD-10-CM | POA: Diagnosis not present

## 2019-06-19 DIAGNOSIS — K56609 Unspecified intestinal obstruction, unspecified as to partial versus complete obstruction: Secondary | ICD-10-CM | POA: Diagnosis not present

## 2019-06-24 DIAGNOSIS — F419 Anxiety disorder, unspecified: Secondary | ICD-10-CM | POA: Diagnosis not present

## 2019-06-26 DIAGNOSIS — L292 Pruritus vulvae: Secondary | ICD-10-CM | POA: Diagnosis not present

## 2019-06-26 DIAGNOSIS — Z124 Encounter for screening for malignant neoplasm of cervix: Secondary | ICD-10-CM | POA: Diagnosis not present

## 2019-06-26 DIAGNOSIS — N952 Postmenopausal atrophic vaginitis: Secondary | ICD-10-CM | POA: Diagnosis not present

## 2019-07-01 DIAGNOSIS — F419 Anxiety disorder, unspecified: Secondary | ICD-10-CM | POA: Diagnosis not present

## 2019-07-08 DIAGNOSIS — F419 Anxiety disorder, unspecified: Secondary | ICD-10-CM | POA: Diagnosis not present

## 2019-07-15 DIAGNOSIS — F419 Anxiety disorder, unspecified: Secondary | ICD-10-CM | POA: Diagnosis not present

## 2019-07-22 DIAGNOSIS — F419 Anxiety disorder, unspecified: Secondary | ICD-10-CM | POA: Diagnosis not present

## 2019-07-28 ENCOUNTER — Other Ambulatory Visit: Payer: Self-pay | Admitting: Emergency Medicine

## 2019-07-28 DIAGNOSIS — Z20822 Contact with and (suspected) exposure to covid-19: Secondary | ICD-10-CM

## 2019-07-29 DIAGNOSIS — F419 Anxiety disorder, unspecified: Secondary | ICD-10-CM | POA: Diagnosis not present

## 2019-07-30 LAB — NOVEL CORONAVIRUS, NAA

## 2019-08-07 ENCOUNTER — Other Ambulatory Visit: Payer: Medicare Other

## 2019-08-12 DIAGNOSIS — F419 Anxiety disorder, unspecified: Secondary | ICD-10-CM | POA: Diagnosis not present

## 2019-08-19 DIAGNOSIS — F419 Anxiety disorder, unspecified: Secondary | ICD-10-CM | POA: Diagnosis not present

## 2019-09-02 DIAGNOSIS — F419 Anxiety disorder, unspecified: Secondary | ICD-10-CM | POA: Diagnosis not present

## 2019-09-04 DIAGNOSIS — C672 Malignant neoplasm of lateral wall of bladder: Secondary | ICD-10-CM | POA: Diagnosis not present

## 2019-09-09 DIAGNOSIS — F419 Anxiety disorder, unspecified: Secondary | ICD-10-CM | POA: Diagnosis not present

## 2019-09-13 DIAGNOSIS — F419 Anxiety disorder, unspecified: Secondary | ICD-10-CM | POA: Diagnosis not present

## 2019-09-16 DIAGNOSIS — F419 Anxiety disorder, unspecified: Secondary | ICD-10-CM | POA: Diagnosis not present

## 2019-09-23 DIAGNOSIS — F419 Anxiety disorder, unspecified: Secondary | ICD-10-CM | POA: Diagnosis not present

## 2019-09-30 DIAGNOSIS — F419 Anxiety disorder, unspecified: Secondary | ICD-10-CM | POA: Diagnosis not present

## 2019-11-17 DIAGNOSIS — Z20828 Contact with and (suspected) exposure to other viral communicable diseases: Secondary | ICD-10-CM | POA: Diagnosis not present

## 2019-12-02 DIAGNOSIS — F419 Anxiety disorder, unspecified: Secondary | ICD-10-CM | POA: Diagnosis not present

## 2019-12-09 DIAGNOSIS — F419 Anxiety disorder, unspecified: Secondary | ICD-10-CM | POA: Diagnosis not present

## 2019-12-16 DIAGNOSIS — F419 Anxiety disorder, unspecified: Secondary | ICD-10-CM | POA: Diagnosis not present

## 2019-12-23 DIAGNOSIS — F419 Anxiety disorder, unspecified: Secondary | ICD-10-CM | POA: Diagnosis not present

## 2020-01-06 DIAGNOSIS — F419 Anxiety disorder, unspecified: Secondary | ICD-10-CM | POA: Diagnosis not present

## 2020-01-13 DIAGNOSIS — F419 Anxiety disorder, unspecified: Secondary | ICD-10-CM | POA: Diagnosis not present

## 2020-01-20 DIAGNOSIS — F419 Anxiety disorder, unspecified: Secondary | ICD-10-CM | POA: Diagnosis not present

## 2020-01-27 DIAGNOSIS — F419 Anxiety disorder, unspecified: Secondary | ICD-10-CM | POA: Diagnosis not present

## 2020-02-03 DIAGNOSIS — F419 Anxiety disorder, unspecified: Secondary | ICD-10-CM | POA: Diagnosis not present

## 2020-02-10 DIAGNOSIS — F419 Anxiety disorder, unspecified: Secondary | ICD-10-CM | POA: Diagnosis not present

## 2020-02-17 DIAGNOSIS — F419 Anxiety disorder, unspecified: Secondary | ICD-10-CM | POA: Diagnosis not present

## 2020-02-24 DIAGNOSIS — F419 Anxiety disorder, unspecified: Secondary | ICD-10-CM | POA: Diagnosis not present

## 2020-03-02 DIAGNOSIS — F419 Anxiety disorder, unspecified: Secondary | ICD-10-CM | POA: Diagnosis not present

## 2020-03-09 DIAGNOSIS — F419 Anxiety disorder, unspecified: Secondary | ICD-10-CM | POA: Diagnosis not present

## 2020-03-16 DIAGNOSIS — F419 Anxiety disorder, unspecified: Secondary | ICD-10-CM | POA: Diagnosis not present

## 2020-03-18 DIAGNOSIS — Z1389 Encounter for screening for other disorder: Secondary | ICD-10-CM | POA: Diagnosis not present

## 2020-03-18 DIAGNOSIS — Z1211 Encounter for screening for malignant neoplasm of colon: Secondary | ICD-10-CM | POA: Diagnosis not present

## 2020-03-18 DIAGNOSIS — Z0000 Encounter for general adult medical examination without abnormal findings: Secondary | ICD-10-CM | POA: Diagnosis not present

## 2020-03-18 DIAGNOSIS — E2839 Other primary ovarian failure: Secondary | ICD-10-CM | POA: Diagnosis not present

## 2020-03-18 DIAGNOSIS — Z23 Encounter for immunization: Secondary | ICD-10-CM | POA: Diagnosis not present

## 2020-03-18 DIAGNOSIS — I10 Essential (primary) hypertension: Secondary | ICD-10-CM | POA: Diagnosis not present

## 2020-03-18 DIAGNOSIS — E7800 Pure hypercholesterolemia, unspecified: Secondary | ICD-10-CM | POA: Diagnosis not present

## 2020-03-18 DIAGNOSIS — Z8551 Personal history of malignant neoplasm of bladder: Secondary | ICD-10-CM | POA: Diagnosis not present

## 2020-03-21 ENCOUNTER — Other Ambulatory Visit: Payer: Self-pay | Admitting: Internal Medicine

## 2020-03-21 DIAGNOSIS — E2839 Other primary ovarian failure: Secondary | ICD-10-CM

## 2020-03-23 DIAGNOSIS — F419 Anxiety disorder, unspecified: Secondary | ICD-10-CM | POA: Diagnosis not present

## 2020-05-25 DIAGNOSIS — F419 Anxiety disorder, unspecified: Secondary | ICD-10-CM | POA: Diagnosis not present

## 2020-05-26 DIAGNOSIS — F419 Anxiety disorder, unspecified: Secondary | ICD-10-CM | POA: Diagnosis not present

## 2020-06-01 DIAGNOSIS — F419 Anxiety disorder, unspecified: Secondary | ICD-10-CM | POA: Diagnosis not present

## 2020-06-08 DIAGNOSIS — F419 Anxiety disorder, unspecified: Secondary | ICD-10-CM | POA: Diagnosis not present

## 2020-06-15 DIAGNOSIS — F419 Anxiety disorder, unspecified: Secondary | ICD-10-CM | POA: Diagnosis not present

## 2020-06-29 DIAGNOSIS — F419 Anxiety disorder, unspecified: Secondary | ICD-10-CM | POA: Diagnosis not present

## 2020-07-06 DIAGNOSIS — F419 Anxiety disorder, unspecified: Secondary | ICD-10-CM | POA: Diagnosis not present

## 2020-07-20 DIAGNOSIS — F419 Anxiety disorder, unspecified: Secondary | ICD-10-CM | POA: Diagnosis not present

## 2020-08-01 DIAGNOSIS — F419 Anxiety disorder, unspecified: Secondary | ICD-10-CM | POA: Diagnosis not present

## 2020-08-10 DIAGNOSIS — F419 Anxiety disorder, unspecified: Secondary | ICD-10-CM | POA: Diagnosis not present

## 2020-08-15 DIAGNOSIS — F419 Anxiety disorder, unspecified: Secondary | ICD-10-CM | POA: Diagnosis not present

## 2020-08-29 DIAGNOSIS — F419 Anxiety disorder, unspecified: Secondary | ICD-10-CM | POA: Diagnosis not present

## 2020-09-07 DIAGNOSIS — F419 Anxiety disorder, unspecified: Secondary | ICD-10-CM | POA: Diagnosis not present

## 2020-09-14 DIAGNOSIS — F419 Anxiety disorder, unspecified: Secondary | ICD-10-CM | POA: Diagnosis not present

## 2020-09-21 DIAGNOSIS — F419 Anxiety disorder, unspecified: Secondary | ICD-10-CM | POA: Diagnosis not present

## 2020-10-05 DIAGNOSIS — F419 Anxiety disorder, unspecified: Secondary | ICD-10-CM | POA: Diagnosis not present

## 2020-10-12 DIAGNOSIS — F419 Anxiety disorder, unspecified: Secondary | ICD-10-CM | POA: Diagnosis not present

## 2020-10-24 DIAGNOSIS — R059 Cough, unspecified: Secondary | ICD-10-CM | POA: Diagnosis not present

## 2020-10-24 DIAGNOSIS — R509 Fever, unspecified: Secondary | ICD-10-CM | POA: Diagnosis not present

## 2020-10-26 DIAGNOSIS — F419 Anxiety disorder, unspecified: Secondary | ICD-10-CM | POA: Diagnosis not present

## 2020-11-02 DIAGNOSIS — F419 Anxiety disorder, unspecified: Secondary | ICD-10-CM | POA: Diagnosis not present

## 2020-11-09 DIAGNOSIS — F419 Anxiety disorder, unspecified: Secondary | ICD-10-CM | POA: Diagnosis not present

## 2020-11-11 ENCOUNTER — Other Ambulatory Visit: Payer: Self-pay | Admitting: Internal Medicine

## 2020-11-11 DIAGNOSIS — Z1231 Encounter for screening mammogram for malignant neoplasm of breast: Secondary | ICD-10-CM

## 2020-11-16 DIAGNOSIS — F419 Anxiety disorder, unspecified: Secondary | ICD-10-CM | POA: Diagnosis not present

## 2020-11-21 DIAGNOSIS — R635 Abnormal weight gain: Secondary | ICD-10-CM | POA: Diagnosis not present

## 2020-11-21 DIAGNOSIS — N951 Menopausal and female climacteric states: Secondary | ICD-10-CM | POA: Diagnosis not present

## 2020-11-21 DIAGNOSIS — E559 Vitamin D deficiency, unspecified: Secondary | ICD-10-CM | POA: Diagnosis not present

## 2020-11-21 DIAGNOSIS — E782 Mixed hyperlipidemia: Secondary | ICD-10-CM | POA: Diagnosis not present

## 2020-11-22 DIAGNOSIS — R350 Frequency of micturition: Secondary | ICD-10-CM | POA: Diagnosis not present

## 2020-11-22 DIAGNOSIS — R3914 Feeling of incomplete bladder emptying: Secondary | ICD-10-CM | POA: Diagnosis not present

## 2020-11-22 DIAGNOSIS — Z8551 Personal history of malignant neoplasm of bladder: Secondary | ICD-10-CM | POA: Diagnosis not present

## 2020-11-23 DIAGNOSIS — F419 Anxiety disorder, unspecified: Secondary | ICD-10-CM | POA: Diagnosis not present

## 2020-11-23 DIAGNOSIS — E559 Vitamin D deficiency, unspecified: Secondary | ICD-10-CM | POA: Diagnosis not present

## 2020-11-23 DIAGNOSIS — R232 Flushing: Secondary | ICD-10-CM | POA: Diagnosis not present

## 2020-11-23 DIAGNOSIS — Z6830 Body mass index (BMI) 30.0-30.9, adult: Secondary | ICD-10-CM | POA: Diagnosis not present

## 2020-11-23 DIAGNOSIS — Z1331 Encounter for screening for depression: Secondary | ICD-10-CM | POA: Diagnosis not present

## 2020-11-23 DIAGNOSIS — R6882 Decreased libido: Secondary | ICD-10-CM | POA: Diagnosis not present

## 2020-11-23 DIAGNOSIS — E663 Overweight: Secondary | ICD-10-CM | POA: Diagnosis not present

## 2020-11-23 DIAGNOSIS — Z1339 Encounter for screening examination for other mental health and behavioral disorders: Secondary | ICD-10-CM | POA: Diagnosis not present

## 2020-11-23 DIAGNOSIS — N898 Other specified noninflammatory disorders of vagina: Secondary | ICD-10-CM | POA: Diagnosis not present

## 2020-11-23 DIAGNOSIS — N951 Menopausal and female climacteric states: Secondary | ICD-10-CM | POA: Diagnosis not present

## 2020-11-23 DIAGNOSIS — Z23 Encounter for immunization: Secondary | ICD-10-CM | POA: Diagnosis not present

## 2020-11-23 DIAGNOSIS — R600 Localized edema: Secondary | ICD-10-CM | POA: Diagnosis not present

## 2020-11-23 DIAGNOSIS — E782 Mixed hyperlipidemia: Secondary | ICD-10-CM | POA: Diagnosis not present

## 2020-11-24 DIAGNOSIS — R000 Tachycardia, unspecified: Secondary | ICD-10-CM | POA: Diagnosis not present

## 2020-11-24 DIAGNOSIS — R5383 Other fatigue: Secondary | ICD-10-CM | POA: Diagnosis not present

## 2020-11-24 DIAGNOSIS — R7982 Elevated C-reactive protein (CRP): Secondary | ICD-10-CM | POA: Diagnosis not present

## 2020-11-24 DIAGNOSIS — R52 Pain, unspecified: Secondary | ICD-10-CM | POA: Diagnosis not present

## 2020-11-24 DIAGNOSIS — R519 Headache, unspecified: Secondary | ICD-10-CM | POA: Diagnosis not present

## 2020-11-28 DIAGNOSIS — F419 Anxiety disorder, unspecified: Secondary | ICD-10-CM | POA: Diagnosis not present

## 2020-12-02 DIAGNOSIS — M2550 Pain in unspecified joint: Secondary | ICD-10-CM | POA: Diagnosis not present

## 2020-12-02 DIAGNOSIS — M6281 Muscle weakness (generalized): Secondary | ICD-10-CM | POA: Diagnosis not present

## 2020-12-02 DIAGNOSIS — R7982 Elevated C-reactive protein (CRP): Secondary | ICD-10-CM | POA: Diagnosis not present

## 2020-12-09 DIAGNOSIS — M6289 Other specified disorders of muscle: Secondary | ICD-10-CM | POA: Diagnosis not present

## 2020-12-09 DIAGNOSIS — M19041 Primary osteoarthritis, right hand: Secondary | ICD-10-CM | POA: Diagnosis not present

## 2020-12-09 DIAGNOSIS — M6281 Muscle weakness (generalized): Secondary | ICD-10-CM | POA: Diagnosis not present

## 2020-12-09 DIAGNOSIS — M1990 Unspecified osteoarthritis, unspecified site: Secondary | ICD-10-CM | POA: Diagnosis not present

## 2020-12-09 DIAGNOSIS — R21 Rash and other nonspecific skin eruption: Secondary | ICD-10-CM | POA: Diagnosis not present

## 2020-12-09 DIAGNOSIS — Z79899 Other long term (current) drug therapy: Secondary | ICD-10-CM | POA: Diagnosis not present

## 2020-12-09 DIAGNOSIS — M19072 Primary osteoarthritis, left ankle and foot: Secondary | ICD-10-CM | POA: Diagnosis not present

## 2020-12-09 DIAGNOSIS — M25579 Pain in unspecified ankle and joints of unspecified foot: Secondary | ICD-10-CM | POA: Diagnosis not present

## 2020-12-09 DIAGNOSIS — M130 Polyarthritis, unspecified: Secondary | ICD-10-CM | POA: Diagnosis not present

## 2020-12-09 DIAGNOSIS — M79641 Pain in right hand: Secondary | ICD-10-CM | POA: Diagnosis not present

## 2020-12-09 DIAGNOSIS — M79642 Pain in left hand: Secondary | ICD-10-CM | POA: Diagnosis not present

## 2020-12-09 DIAGNOSIS — M19071 Primary osteoarthritis, right ankle and foot: Secondary | ICD-10-CM | POA: Diagnosis not present

## 2020-12-09 DIAGNOSIS — M79672 Pain in left foot: Secondary | ICD-10-CM | POA: Diagnosis not present

## 2020-12-09 DIAGNOSIS — N393 Stress incontinence (female) (male): Secondary | ICD-10-CM | POA: Diagnosis not present

## 2020-12-09 DIAGNOSIS — M533 Sacrococcygeal disorders, not elsewhere classified: Secondary | ICD-10-CM | POA: Diagnosis not present

## 2020-12-09 DIAGNOSIS — M25439 Effusion, unspecified wrist: Secondary | ICD-10-CM | POA: Diagnosis not present

## 2020-12-09 DIAGNOSIS — M25539 Pain in unspecified wrist: Secondary | ICD-10-CM | POA: Diagnosis not present

## 2020-12-09 DIAGNOSIS — M62838 Other muscle spasm: Secondary | ICD-10-CM | POA: Diagnosis not present

## 2020-12-09 DIAGNOSIS — M79671 Pain in right foot: Secondary | ICD-10-CM | POA: Diagnosis not present

## 2020-12-09 DIAGNOSIS — R7982 Elevated C-reactive protein (CRP): Secondary | ICD-10-CM | POA: Diagnosis not present

## 2020-12-09 DIAGNOSIS — L609 Nail disorder, unspecified: Secondary | ICD-10-CM | POA: Diagnosis not present

## 2020-12-09 DIAGNOSIS — M25473 Effusion, unspecified ankle: Secondary | ICD-10-CM | POA: Diagnosis not present

## 2020-12-09 DIAGNOSIS — M4316 Spondylolisthesis, lumbar region: Secondary | ICD-10-CM | POA: Diagnosis not present

## 2020-12-09 DIAGNOSIS — M5136 Other intervertebral disc degeneration, lumbar region: Secondary | ICD-10-CM | POA: Diagnosis not present

## 2020-12-09 DIAGNOSIS — M064 Inflammatory polyarthropathy: Secondary | ICD-10-CM | POA: Diagnosis not present

## 2020-12-09 DIAGNOSIS — M19042 Primary osteoarthritis, left hand: Secondary | ICD-10-CM | POA: Diagnosis not present

## 2020-12-09 DIAGNOSIS — R3914 Feeling of incomplete bladder emptying: Secondary | ICD-10-CM | POA: Diagnosis not present

## 2020-12-12 DIAGNOSIS — F419 Anxiety disorder, unspecified: Secondary | ICD-10-CM | POA: Diagnosis not present

## 2020-12-30 DIAGNOSIS — L609 Nail disorder, unspecified: Secondary | ICD-10-CM | POA: Diagnosis not present

## 2020-12-30 DIAGNOSIS — R7989 Other specified abnormal findings of blood chemistry: Secondary | ICD-10-CM | POA: Diagnosis not present

## 2020-12-30 DIAGNOSIS — M79643 Pain in unspecified hand: Secondary | ICD-10-CM | POA: Diagnosis not present

## 2020-12-30 DIAGNOSIS — Z79899 Other long term (current) drug therapy: Secondary | ICD-10-CM | POA: Diagnosis not present

## 2020-12-30 DIAGNOSIS — Z6830 Body mass index (BMI) 30.0-30.9, adult: Secondary | ICD-10-CM | POA: Diagnosis not present

## 2020-12-30 DIAGNOSIS — E782 Mixed hyperlipidemia: Secondary | ICD-10-CM | POA: Diagnosis not present

## 2020-12-30 DIAGNOSIS — R7982 Elevated C-reactive protein (CRP): Secondary | ICD-10-CM | POA: Diagnosis not present

## 2020-12-30 DIAGNOSIS — M25579 Pain in unspecified ankle and joints of unspecified foot: Secondary | ICD-10-CM | POA: Diagnosis not present

## 2020-12-30 DIAGNOSIS — M25473 Effusion, unspecified ankle: Secondary | ICD-10-CM | POA: Diagnosis not present

## 2020-12-30 DIAGNOSIS — M7989 Other specified soft tissue disorders: Secondary | ICD-10-CM | POA: Diagnosis not present

## 2020-12-30 DIAGNOSIS — R21 Rash and other nonspecific skin eruption: Secondary | ICD-10-CM | POA: Diagnosis not present

## 2020-12-30 DIAGNOSIS — L4050 Arthropathic psoriasis, unspecified: Secondary | ICD-10-CM | POA: Diagnosis not present

## 2020-12-30 DIAGNOSIS — M130 Polyarthritis, unspecified: Secondary | ICD-10-CM | POA: Diagnosis not present

## 2021-01-02 DIAGNOSIS — F419 Anxiety disorder, unspecified: Secondary | ICD-10-CM | POA: Diagnosis not present

## 2021-01-06 DIAGNOSIS — Z6830 Body mass index (BMI) 30.0-30.9, adult: Secondary | ICD-10-CM | POA: Diagnosis not present

## 2021-01-06 DIAGNOSIS — E559 Vitamin D deficiency, unspecified: Secondary | ICD-10-CM | POA: Diagnosis not present

## 2021-01-13 DIAGNOSIS — E559 Vitamin D deficiency, unspecified: Secondary | ICD-10-CM | POA: Diagnosis not present

## 2021-01-13 DIAGNOSIS — Z6830 Body mass index (BMI) 30.0-30.9, adult: Secondary | ICD-10-CM | POA: Diagnosis not present

## 2021-01-16 DIAGNOSIS — F419 Anxiety disorder, unspecified: Secondary | ICD-10-CM | POA: Diagnosis not present

## 2021-01-23 DIAGNOSIS — F419 Anxiety disorder, unspecified: Secondary | ICD-10-CM | POA: Diagnosis not present

## 2021-01-30 DIAGNOSIS — F419 Anxiety disorder, unspecified: Secondary | ICD-10-CM | POA: Diagnosis not present

## 2021-02-03 DIAGNOSIS — M79643 Pain in unspecified hand: Secondary | ICD-10-CM | POA: Diagnosis not present

## 2021-02-03 DIAGNOSIS — M7989 Other specified soft tissue disorders: Secondary | ICD-10-CM | POA: Diagnosis not present

## 2021-02-03 DIAGNOSIS — R7982 Elevated C-reactive protein (CRP): Secondary | ICD-10-CM | POA: Diagnosis not present

## 2021-02-03 DIAGNOSIS — R21 Rash and other nonspecific skin eruption: Secondary | ICD-10-CM | POA: Diagnosis not present

## 2021-02-03 DIAGNOSIS — M25579 Pain in unspecified ankle and joints of unspecified foot: Secondary | ICD-10-CM | POA: Diagnosis not present

## 2021-02-03 DIAGNOSIS — M130 Polyarthritis, unspecified: Secondary | ICD-10-CM | POA: Diagnosis not present

## 2021-02-03 DIAGNOSIS — Z79899 Other long term (current) drug therapy: Secondary | ICD-10-CM | POA: Diagnosis not present

## 2021-02-03 DIAGNOSIS — L4050 Arthropathic psoriasis, unspecified: Secondary | ICD-10-CM | POA: Diagnosis not present

## 2021-02-03 DIAGNOSIS — L609 Nail disorder, unspecified: Secondary | ICD-10-CM | POA: Diagnosis not present

## 2021-02-03 DIAGNOSIS — M25473 Effusion, unspecified ankle: Secondary | ICD-10-CM | POA: Diagnosis not present

## 2021-02-06 DIAGNOSIS — L905 Scar conditions and fibrosis of skin: Secondary | ICD-10-CM | POA: Diagnosis not present

## 2021-02-06 DIAGNOSIS — L821 Other seborrheic keratosis: Secondary | ICD-10-CM | POA: Diagnosis not present

## 2021-02-06 DIAGNOSIS — D1801 Hemangioma of skin and subcutaneous tissue: Secondary | ICD-10-CM | POA: Diagnosis not present

## 2021-02-06 DIAGNOSIS — L570 Actinic keratosis: Secondary | ICD-10-CM | POA: Diagnosis not present

## 2021-02-06 DIAGNOSIS — D225 Melanocytic nevi of trunk: Secondary | ICD-10-CM | POA: Diagnosis not present

## 2021-02-06 DIAGNOSIS — L814 Other melanin hyperpigmentation: Secondary | ICD-10-CM | POA: Diagnosis not present

## 2021-02-06 DIAGNOSIS — R21 Rash and other nonspecific skin eruption: Secondary | ICD-10-CM | POA: Diagnosis not present

## 2021-02-06 DIAGNOSIS — D2261 Melanocytic nevi of right upper limb, including shoulder: Secondary | ICD-10-CM | POA: Diagnosis not present

## 2021-02-13 DIAGNOSIS — F419 Anxiety disorder, unspecified: Secondary | ICD-10-CM | POA: Diagnosis not present

## 2021-02-17 ENCOUNTER — Ambulatory Visit
Admission: RE | Admit: 2021-02-17 | Discharge: 2021-02-17 | Disposition: A | Discharge status: Home / Self Care | Payer: Medicare Other | Source: Ambulatory Visit | Attending: Internal Medicine | Admitting: Internal Medicine

## 2021-02-17 ENCOUNTER — Other Ambulatory Visit: Payer: PRIVATE HEALTH INSURANCE

## 2021-02-17 ENCOUNTER — Other Ambulatory Visit: Payer: Self-pay

## 2021-02-17 DIAGNOSIS — Z1231 Encounter for screening mammogram for malignant neoplasm of breast: Secondary | ICD-10-CM

## 2021-02-24 DIAGNOSIS — N393 Stress incontinence (female) (male): Secondary | ICD-10-CM | POA: Diagnosis not present

## 2021-02-24 DIAGNOSIS — M6281 Muscle weakness (generalized): Secondary | ICD-10-CM | POA: Diagnosis not present

## 2021-02-24 DIAGNOSIS — M6289 Other specified disorders of muscle: Secondary | ICD-10-CM | POA: Diagnosis not present

## 2021-02-24 DIAGNOSIS — R3914 Feeling of incomplete bladder emptying: Secondary | ICD-10-CM | POA: Diagnosis not present

## 2021-02-24 DIAGNOSIS — M62838 Other muscle spasm: Secondary | ICD-10-CM | POA: Diagnosis not present

## 2021-02-27 DIAGNOSIS — E782 Mixed hyperlipidemia: Secondary | ICD-10-CM | POA: Diagnosis not present

## 2021-02-27 DIAGNOSIS — F419 Anxiety disorder, unspecified: Secondary | ICD-10-CM | POA: Diagnosis not present

## 2021-02-27 DIAGNOSIS — Z6831 Body mass index (BMI) 31.0-31.9, adult: Secondary | ICD-10-CM | POA: Diagnosis not present

## 2021-03-03 ENCOUNTER — Other Ambulatory Visit: Payer: PRIVATE HEALTH INSURANCE

## 2021-03-03 DIAGNOSIS — M130 Polyarthritis, unspecified: Secondary | ICD-10-CM | POA: Diagnosis not present

## 2021-03-03 DIAGNOSIS — M7989 Other specified soft tissue disorders: Secondary | ICD-10-CM | POA: Diagnosis not present

## 2021-03-03 DIAGNOSIS — M79643 Pain in unspecified hand: Secondary | ICD-10-CM | POA: Diagnosis not present

## 2021-03-03 DIAGNOSIS — M25473 Effusion, unspecified ankle: Secondary | ICD-10-CM | POA: Diagnosis not present

## 2021-03-03 DIAGNOSIS — R21 Rash and other nonspecific skin eruption: Secondary | ICD-10-CM | POA: Diagnosis not present

## 2021-03-03 DIAGNOSIS — L609 Nail disorder, unspecified: Secondary | ICD-10-CM | POA: Diagnosis not present

## 2021-03-03 DIAGNOSIS — L4050 Arthropathic psoriasis, unspecified: Secondary | ICD-10-CM | POA: Diagnosis not present

## 2021-03-03 DIAGNOSIS — R7982 Elevated C-reactive protein (CRP): Secondary | ICD-10-CM | POA: Diagnosis not present

## 2021-03-03 DIAGNOSIS — M25579 Pain in unspecified ankle and joints of unspecified foot: Secondary | ICD-10-CM | POA: Diagnosis not present

## 2021-03-03 DIAGNOSIS — M1990 Unspecified osteoarthritis, unspecified site: Secondary | ICD-10-CM | POA: Diagnosis not present

## 2021-03-03 DIAGNOSIS — Z79899 Other long term (current) drug therapy: Secondary | ICD-10-CM | POA: Diagnosis not present

## 2021-03-08 DIAGNOSIS — F419 Anxiety disorder, unspecified: Secondary | ICD-10-CM | POA: Diagnosis not present

## 2021-03-13 DIAGNOSIS — F419 Anxiety disorder, unspecified: Secondary | ICD-10-CM | POA: Diagnosis not present

## 2021-03-20 DIAGNOSIS — F419 Anxiety disorder, unspecified: Secondary | ICD-10-CM | POA: Diagnosis not present

## 2021-03-24 DIAGNOSIS — Z8551 Personal history of malignant neoplasm of bladder: Secondary | ICD-10-CM | POA: Diagnosis not present

## 2021-03-24 DIAGNOSIS — I10 Essential (primary) hypertension: Secondary | ICD-10-CM | POA: Diagnosis not present

## 2021-03-24 DIAGNOSIS — Z0000 Encounter for general adult medical examination without abnormal findings: Secondary | ICD-10-CM | POA: Diagnosis not present

## 2021-03-24 DIAGNOSIS — Z1211 Encounter for screening for malignant neoplasm of colon: Secondary | ICD-10-CM | POA: Diagnosis not present

## 2021-03-24 DIAGNOSIS — L4050 Arthropathic psoriasis, unspecified: Secondary | ICD-10-CM | POA: Diagnosis not present

## 2021-03-24 DIAGNOSIS — E7800 Pure hypercholesterolemia, unspecified: Secondary | ICD-10-CM | POA: Diagnosis not present

## 2021-03-27 DIAGNOSIS — F419 Anxiety disorder, unspecified: Secondary | ICD-10-CM | POA: Diagnosis not present

## 2021-03-31 DIAGNOSIS — M6281 Muscle weakness (generalized): Secondary | ICD-10-CM | POA: Diagnosis not present

## 2021-03-31 DIAGNOSIS — R3914 Feeling of incomplete bladder emptying: Secondary | ICD-10-CM | POA: Diagnosis not present

## 2021-03-31 DIAGNOSIS — N393 Stress incontinence (female) (male): Secondary | ICD-10-CM | POA: Diagnosis not present

## 2021-03-31 DIAGNOSIS — R350 Frequency of micturition: Secondary | ICD-10-CM | POA: Diagnosis not present

## 2021-03-31 DIAGNOSIS — M62838 Other muscle spasm: Secondary | ICD-10-CM | POA: Diagnosis not present

## 2021-03-31 DIAGNOSIS — M6289 Other specified disorders of muscle: Secondary | ICD-10-CM | POA: Diagnosis not present

## 2021-04-03 DIAGNOSIS — F419 Anxiety disorder, unspecified: Secondary | ICD-10-CM | POA: Diagnosis not present

## 2021-04-07 DIAGNOSIS — E559 Vitamin D deficiency, unspecified: Secondary | ICD-10-CM | POA: Diagnosis not present

## 2021-04-07 DIAGNOSIS — Z6831 Body mass index (BMI) 31.0-31.9, adult: Secondary | ICD-10-CM | POA: Diagnosis not present

## 2021-04-10 DIAGNOSIS — F419 Anxiety disorder, unspecified: Secondary | ICD-10-CM | POA: Diagnosis not present

## 2021-04-17 DIAGNOSIS — F419 Anxiety disorder, unspecified: Secondary | ICD-10-CM | POA: Diagnosis not present

## 2021-05-01 DIAGNOSIS — F419 Anxiety disorder, unspecified: Secondary | ICD-10-CM | POA: Diagnosis not present

## 2021-05-08 DIAGNOSIS — Z23 Encounter for immunization: Secondary | ICD-10-CM | POA: Diagnosis not present

## 2021-05-08 DIAGNOSIS — K121 Other forms of stomatitis: Secondary | ICD-10-CM | POA: Diagnosis not present

## 2021-05-08 DIAGNOSIS — M7989 Other specified soft tissue disorders: Secondary | ICD-10-CM | POA: Diagnosis not present

## 2021-05-08 DIAGNOSIS — L4050 Arthropathic psoriasis, unspecified: Secondary | ICD-10-CM | POA: Diagnosis not present

## 2021-05-08 DIAGNOSIS — F419 Anxiety disorder, unspecified: Secondary | ICD-10-CM | POA: Diagnosis not present

## 2021-05-08 DIAGNOSIS — M1990 Unspecified osteoarthritis, unspecified site: Secondary | ICD-10-CM | POA: Diagnosis not present

## 2021-05-08 DIAGNOSIS — M130 Polyarthritis, unspecified: Secondary | ICD-10-CM | POA: Diagnosis not present

## 2021-05-08 DIAGNOSIS — R21 Rash and other nonspecific skin eruption: Secondary | ICD-10-CM | POA: Diagnosis not present

## 2021-05-08 DIAGNOSIS — R5383 Other fatigue: Secondary | ICD-10-CM | POA: Diagnosis not present

## 2021-05-08 DIAGNOSIS — Z79899 Other long term (current) drug therapy: Secondary | ICD-10-CM | POA: Diagnosis not present

## 2021-05-08 DIAGNOSIS — M79643 Pain in unspecified hand: Secondary | ICD-10-CM | POA: Diagnosis not present

## 2021-05-08 DIAGNOSIS — L609 Nail disorder, unspecified: Secondary | ICD-10-CM | POA: Diagnosis not present

## 2021-05-08 DIAGNOSIS — M25473 Effusion, unspecified ankle: Secondary | ICD-10-CM | POA: Diagnosis not present

## 2021-05-15 DIAGNOSIS — F419 Anxiety disorder, unspecified: Secondary | ICD-10-CM | POA: Diagnosis not present

## 2021-05-22 DIAGNOSIS — F419 Anxiety disorder, unspecified: Secondary | ICD-10-CM | POA: Diagnosis not present

## 2021-06-02 DIAGNOSIS — L4050 Arthropathic psoriasis, unspecified: Secondary | ICD-10-CM | POA: Diagnosis not present

## 2021-06-14 DIAGNOSIS — F419 Anxiety disorder, unspecified: Secondary | ICD-10-CM | POA: Diagnosis not present

## 2021-06-26 DIAGNOSIS — F419 Anxiety disorder, unspecified: Secondary | ICD-10-CM | POA: Diagnosis not present

## 2021-06-30 DIAGNOSIS — L4050 Arthropathic psoriasis, unspecified: Secondary | ICD-10-CM | POA: Diagnosis not present

## 2021-07-03 DIAGNOSIS — F419 Anxiety disorder, unspecified: Secondary | ICD-10-CM | POA: Diagnosis not present

## 2021-07-06 DIAGNOSIS — Z20822 Contact with and (suspected) exposure to covid-19: Secondary | ICD-10-CM | POA: Diagnosis not present

## 2021-07-07 DIAGNOSIS — Z20822 Contact with and (suspected) exposure to covid-19: Secondary | ICD-10-CM | POA: Diagnosis not present

## 2021-07-07 DIAGNOSIS — J029 Acute pharyngitis, unspecified: Secondary | ICD-10-CM | POA: Diagnosis not present

## 2021-07-07 DIAGNOSIS — R6883 Chills (without fever): Secondary | ICD-10-CM | POA: Diagnosis not present

## 2021-07-07 DIAGNOSIS — R051 Acute cough: Secondary | ICD-10-CM | POA: Diagnosis not present

## 2021-07-07 DIAGNOSIS — U071 COVID-19: Secondary | ICD-10-CM

## 2021-07-07 DIAGNOSIS — R52 Pain, unspecified: Secondary | ICD-10-CM | POA: Diagnosis not present

## 2021-07-07 HISTORY — DX: COVID-19: U07.1

## 2021-07-10 DIAGNOSIS — F419 Anxiety disorder, unspecified: Secondary | ICD-10-CM | POA: Diagnosis not present

## 2021-08-07 DIAGNOSIS — F419 Anxiety disorder, unspecified: Secondary | ICD-10-CM | POA: Diagnosis not present

## 2021-08-14 DIAGNOSIS — F419 Anxiety disorder, unspecified: Secondary | ICD-10-CM | POA: Diagnosis not present

## 2021-08-21 DIAGNOSIS — M1990 Unspecified osteoarthritis, unspecified site: Secondary | ICD-10-CM | POA: Diagnosis not present

## 2021-08-21 DIAGNOSIS — L609 Nail disorder, unspecified: Secondary | ICD-10-CM | POA: Diagnosis not present

## 2021-08-21 DIAGNOSIS — F419 Anxiety disorder, unspecified: Secondary | ICD-10-CM | POA: Diagnosis not present

## 2021-08-21 DIAGNOSIS — Z79899 Other long term (current) drug therapy: Secondary | ICD-10-CM | POA: Diagnosis not present

## 2021-08-21 DIAGNOSIS — L4050 Arthropathic psoriasis, unspecified: Secondary | ICD-10-CM | POA: Diagnosis not present

## 2021-08-21 DIAGNOSIS — M2550 Pain in unspecified joint: Secondary | ICD-10-CM | POA: Diagnosis not present

## 2021-08-21 DIAGNOSIS — L409 Psoriasis, unspecified: Secondary | ICD-10-CM | POA: Diagnosis not present

## 2021-08-21 DIAGNOSIS — Z23 Encounter for immunization: Secondary | ICD-10-CM | POA: Diagnosis not present

## 2021-08-21 DIAGNOSIS — M79643 Pain in unspecified hand: Secondary | ICD-10-CM | POA: Diagnosis not present

## 2021-08-21 DIAGNOSIS — R7982 Elevated C-reactive protein (CRP): Secondary | ICD-10-CM | POA: Diagnosis not present

## 2021-08-28 DIAGNOSIS — F419 Anxiety disorder, unspecified: Secondary | ICD-10-CM | POA: Diagnosis not present

## 2021-09-04 DIAGNOSIS — F419 Anxiety disorder, unspecified: Secondary | ICD-10-CM | POA: Diagnosis not present

## 2021-09-08 DIAGNOSIS — L4050 Arthropathic psoriasis, unspecified: Secondary | ICD-10-CM | POA: Diagnosis not present

## 2021-09-11 DIAGNOSIS — F419 Anxiety disorder, unspecified: Secondary | ICD-10-CM | POA: Diagnosis not present

## 2021-09-18 DIAGNOSIS — F419 Anxiety disorder, unspecified: Secondary | ICD-10-CM | POA: Diagnosis not present

## 2021-09-26 DIAGNOSIS — F419 Anxiety disorder, unspecified: Secondary | ICD-10-CM | POA: Diagnosis not present

## 2021-10-02 DIAGNOSIS — F419 Anxiety disorder, unspecified: Secondary | ICD-10-CM | POA: Diagnosis not present

## 2021-10-09 DIAGNOSIS — F419 Anxiety disorder, unspecified: Secondary | ICD-10-CM | POA: Diagnosis not present

## 2021-10-16 DIAGNOSIS — F419 Anxiety disorder, unspecified: Secondary | ICD-10-CM | POA: Diagnosis not present

## 2021-10-23 DIAGNOSIS — F419 Anxiety disorder, unspecified: Secondary | ICD-10-CM | POA: Diagnosis not present

## 2021-10-30 DIAGNOSIS — F419 Anxiety disorder, unspecified: Secondary | ICD-10-CM | POA: Diagnosis not present

## 2021-11-03 DIAGNOSIS — L4050 Arthropathic psoriasis, unspecified: Secondary | ICD-10-CM | POA: Diagnosis not present

## 2021-11-06 ENCOUNTER — Emergency Department (HOSPITAL_COMMUNITY): Payer: Medicare Other

## 2021-11-06 ENCOUNTER — Encounter (HOSPITAL_COMMUNITY): Payer: Self-pay

## 2021-11-06 ENCOUNTER — Other Ambulatory Visit: Payer: Self-pay

## 2021-11-06 ENCOUNTER — Observation Stay (HOSPITAL_COMMUNITY): Payer: Medicare Other

## 2021-11-06 ENCOUNTER — Inpatient Hospital Stay (HOSPITAL_COMMUNITY)
Admission: EM | Admit: 2021-11-06 | Discharge: 2021-11-10 | DRG: 390 | Disposition: A | Discharge status: Home / Self Care | Payer: Medicare Other | Attending: Surgery | Admitting: Surgery

## 2021-11-06 DIAGNOSIS — L4050 Arthropathic psoriasis, unspecified: Secondary | ICD-10-CM | POA: Diagnosis present

## 2021-11-06 DIAGNOSIS — Z87891 Personal history of nicotine dependence: Secondary | ICD-10-CM

## 2021-11-06 DIAGNOSIS — Z8719 Personal history of other diseases of the digestive system: Secondary | ICD-10-CM

## 2021-11-06 DIAGNOSIS — K56609 Unspecified intestinal obstruction, unspecified as to partial versus complete obstruction: Secondary | ICD-10-CM | POA: Diagnosis not present

## 2021-11-06 DIAGNOSIS — Z888 Allergy status to other drugs, medicaments and biological substances status: Secondary | ICD-10-CM | POA: Diagnosis not present

## 2021-11-06 DIAGNOSIS — Z885 Allergy status to narcotic agent status: Secondary | ICD-10-CM

## 2021-11-06 DIAGNOSIS — Z4682 Encounter for fitting and adjustment of non-vascular catheter: Secondary | ICD-10-CM | POA: Diagnosis not present

## 2021-11-06 DIAGNOSIS — F419 Anxiety disorder, unspecified: Secondary | ICD-10-CM | POA: Diagnosis not present

## 2021-11-06 DIAGNOSIS — Z91018 Allergy to other foods: Secondary | ICD-10-CM

## 2021-11-06 DIAGNOSIS — Z8249 Family history of ischemic heart disease and other diseases of the circulatory system: Secondary | ICD-10-CM | POA: Diagnosis not present

## 2021-11-06 DIAGNOSIS — Z90710 Acquired absence of both cervix and uterus: Secondary | ICD-10-CM | POA: Diagnosis not present

## 2021-11-06 DIAGNOSIS — Z91040 Latex allergy status: Secondary | ICD-10-CM

## 2021-11-06 DIAGNOSIS — Z0389 Encounter for observation for other suspected diseases and conditions ruled out: Secondary | ICD-10-CM | POA: Diagnosis not present

## 2021-11-06 DIAGNOSIS — Z833 Family history of diabetes mellitus: Secondary | ICD-10-CM | POA: Diagnosis not present

## 2021-11-06 DIAGNOSIS — E785 Hyperlipidemia, unspecified: Secondary | ICD-10-CM | POA: Diagnosis present

## 2021-11-06 DIAGNOSIS — J45909 Unspecified asthma, uncomplicated: Secondary | ICD-10-CM | POA: Diagnosis present

## 2021-11-06 DIAGNOSIS — Z8551 Personal history of malignant neoplasm of bladder: Secondary | ICD-10-CM | POA: Diagnosis not present

## 2021-11-06 DIAGNOSIS — Z9049 Acquired absence of other specified parts of digestive tract: Secondary | ICD-10-CM

## 2021-11-06 DIAGNOSIS — K529 Noninfective gastroenteritis and colitis, unspecified: Secondary | ICD-10-CM | POA: Diagnosis not present

## 2021-11-06 DIAGNOSIS — Z803 Family history of malignant neoplasm of breast: Secondary | ICD-10-CM | POA: Diagnosis not present

## 2021-11-06 DIAGNOSIS — E7800 Pure hypercholesterolemia, unspecified: Secondary | ICD-10-CM | POA: Diagnosis present

## 2021-11-06 DIAGNOSIS — R109 Unspecified abdominal pain: Secondary | ICD-10-CM | POA: Diagnosis not present

## 2021-11-06 DIAGNOSIS — Z79899 Other long term (current) drug therapy: Secondary | ICD-10-CM | POA: Diagnosis not present

## 2021-11-06 DIAGNOSIS — K6389 Other specified diseases of intestine: Secondary | ICD-10-CM | POA: Diagnosis not present

## 2021-11-06 DIAGNOSIS — R140 Abdominal distension (gaseous): Secondary | ICD-10-CM | POA: Diagnosis not present

## 2021-11-06 DIAGNOSIS — Z0189 Encounter for other specified special examinations: Secondary | ICD-10-CM

## 2021-11-06 DIAGNOSIS — K5669 Other partial intestinal obstruction: Secondary | ICD-10-CM | POA: Diagnosis not present

## 2021-11-06 HISTORY — DX: Personal history of other diseases of the digestive system: Z87.19

## 2021-11-06 LAB — CBC WITH DIFFERENTIAL/PLATELET
Abs Immature Granulocytes: 0.03 10*3/uL (ref 0.00–0.07)
Basophils Absolute: 0.1 10*3/uL (ref 0.0–0.1)
Basophils Relative: 1 %
Eosinophils Absolute: 0.1 10*3/uL (ref 0.0–0.5)
Eosinophils Relative: 1 %
HCT: 44.6 % (ref 36.0–46.0)
Hemoglobin: 14.6 g/dL (ref 12.0–15.0)
Immature Granulocytes: 0 %
Lymphocytes Relative: 22 %
Lymphs Abs: 2.1 10*3/uL (ref 0.7–4.0)
MCH: 30.2 pg (ref 26.0–34.0)
MCHC: 32.7 g/dL (ref 30.0–36.0)
MCV: 92.1 fL (ref 80.0–100.0)
Monocytes Absolute: 0.4 10*3/uL (ref 0.1–1.0)
Monocytes Relative: 5 %
Neutro Abs: 6.9 10*3/uL (ref 1.7–7.7)
Neutrophils Relative %: 71 %
Platelets: 233 10*3/uL (ref 150–400)
RBC: 4.84 MIL/uL (ref 3.87–5.11)
RDW: 12.8 % (ref 11.5–15.5)
WBC: 9.7 10*3/uL (ref 4.0–10.5)
nRBC: 0 % (ref 0.0–0.2)

## 2021-11-06 LAB — COMPREHENSIVE METABOLIC PANEL
ALT: 17 U/L (ref 0–44)
AST: 16 U/L (ref 15–41)
Albumin: 4.4 g/dL (ref 3.5–5.0)
Alkaline Phosphatase: 96 U/L (ref 38–126)
Anion gap: 9 (ref 5–15)
BUN: 19 mg/dL (ref 8–23)
CO2: 24 mmol/L (ref 22–32)
Calcium: 9.5 mg/dL (ref 8.9–10.3)
Chloride: 104 mmol/L (ref 98–111)
Creatinine, Ser: 0.93 mg/dL (ref 0.44–1.00)
GFR, Estimated: >60 mL/min (ref >60)
Glucose, Bld: 134 mg/dL — ABNORMAL HIGH (ref 70–99)
Potassium: 4 mmol/L (ref 3.5–5.1)
Sodium: 137 mmol/L (ref 135–145)
Total Bilirubin: 0.3 mg/dL (ref 0.3–1.2)
Total Protein: 7.5 g/dL (ref 6.5–8.1)

## 2021-11-06 LAB — HIV ANTIBODY (ROUTINE TESTING W REFLEX): HIV Screen 4th Generation wRfx: NONREACTIVE

## 2021-11-06 MED ORDER — ACETAMINOPHEN 325 MG PO TABS
650.0000 mg | ORAL_TABLET | Freq: Four times a day (QID) | ORAL | Status: DC | PRN
  Administered 2021-11-09 – 2021-11-10 (×5): 650 mg via ORAL
  Filled 2021-11-06 (×5): qty 2

## 2021-11-06 MED ORDER — ONDANSETRON HCL 4 MG/2ML IJ SOLN
4.0000 mg | Freq: Four times a day (QID) | INTRAMUSCULAR | Status: DC | PRN
  Administered 2021-11-06 – 2021-11-08 (×9): 4 mg via INTRAVENOUS
  Filled 2021-11-06 (×9): qty 2

## 2021-11-06 MED ORDER — ONDANSETRON 4 MG PO TBDP
4.0000 mg | ORAL_TABLET | Freq: Four times a day (QID) | ORAL | Status: DC | PRN
  Administered 2021-11-10: 4 mg via ORAL
  Filled 2021-11-06: qty 1

## 2021-11-06 MED ORDER — HYDRALAZINE HCL 20 MG/ML IJ SOLN: 10.0000 mg | INTRAMUSCULAR | Status: DC | PRN

## 2021-11-06 MED ORDER — SODIUM CHLORIDE 0.9 % IV SOLN: INTRAVENOUS | Status: DC

## 2021-11-06 MED ORDER — MORPHINE SULFATE (PF) 4 MG/ML IV SOLN
4.0000 mg | Freq: Once | INTRAVENOUS | Status: AC
  Administered 2021-11-06: 4 mg via INTRAVENOUS
  Filled 2021-11-06: qty 1

## 2021-11-06 MED ORDER — MORPHINE SULFATE (PF) 2 MG/ML IV SOLN
2.0000 mg | INTRAVENOUS | Status: DC | PRN
  Administered 2021-11-06 – 2021-11-08 (×12): 2 mg via INTRAVENOUS
  Filled 2021-11-06 (×12): qty 1

## 2021-11-06 MED ORDER — DIPHENHYDRAMINE HCL 50 MG/ML IJ SOLN: 12.5000 mg | Freq: Four times a day (QID) | INTRAMUSCULAR | Status: DC | PRN

## 2021-11-06 MED ORDER — ONDANSETRON HCL 4 MG/2ML IJ SOLN
4.0000 mg | Freq: Once | INTRAMUSCULAR | Status: AC
  Administered 2021-11-06: 4 mg via INTRAVENOUS
  Filled 2021-11-06: qty 2

## 2021-11-06 MED ORDER — DIPHENHYDRAMINE HCL 12.5 MG/5ML PO ELIX: 12.5000 mg | ORAL_SOLUTION | Freq: Four times a day (QID) | ORAL | Status: DC | PRN

## 2021-11-06 MED ORDER — ENOXAPARIN SODIUM 40 MG/0.4ML IJ SOSY
40.0000 mg | PREFILLED_SYRINGE | INTRAMUSCULAR | Status: DC
  Administered 2021-11-06 – 2021-11-09 (×4): 40 mg via SUBCUTANEOUS
  Filled 2021-11-06 (×4): qty 0.4

## 2021-11-06 MED ORDER — DIATRIZOATE MEGLUMINE & SODIUM 66-10 % PO SOLN
90.0000 mL | Freq: Once | ORAL | Status: AC
  Administered 2021-11-06: 90 mL via NASOGASTRIC
  Filled 2021-11-06: qty 90

## 2021-11-06 MED ORDER — LACTATED RINGERS IV BOLUS
1000.0000 mL | Freq: Once | INTRAVENOUS | Status: AC
  Administered 2021-11-06: 1000 mL via INTRAVENOUS

## 2021-11-06 MED ORDER — LACTATED RINGERS IV SOLN
INTRAVENOUS | Status: DC
  Administered 2021-11-06: 125 mL via INTRAVENOUS

## 2021-11-06 MED ORDER — ACETAMINOPHEN 650 MG RE SUPP: 650.0000 mg | Freq: Four times a day (QID) | RECTAL | Status: DC | PRN

## 2021-11-06 MED ORDER — METOCLOPRAMIDE HCL 5 MG/ML IJ SOLN
10.0000 mg | Freq: Once | INTRAMUSCULAR | Status: AC
  Administered 2021-11-06: 10 mg via INTRAVENOUS
  Filled 2021-11-06: qty 2

## 2021-11-06 MED ORDER — IOHEXOL 350 MG/ML SOLN
80.0000 mL | Freq: Once | INTRAVENOUS | Status: AC | PRN
  Administered 2021-11-06: 80 mL via INTRAVENOUS

## 2021-11-06 NOTE — H&P (Signed)
[name] 29-May-1951  [id number].    Requesting MD: Dr. Maryan Rued Chief Complaint/Reason for Consult: SBO  HPI: Christina Campos is a 70 y.o. female who presented to the [facility] with upper abdominal pain. She reports that yesterday she ate dinner (rainbow trout, sweet potatoes, lima beans and turnip greens) and about an hour later began having upper abdominal pain.  Patient reports since onset she has been having waxing/waning epigastric abdominal pain that is stabbing in nature and when it subsides she is left with generalized dull/achy pain.  She notes associated cold chills, nausea, decreased appetite, and bloating.  Her symptoms have progressed since onset and become more severe.  She feels this is very similar to when she has had prior bowel obstructions in 2015 and 2020 that both resolved without surgical intervention.  She has been able to pass flatus x1 since symptom onset but no BM.  She reports normal BMs leading up to his symptom onset without constipation or diarrhea.  Prior abdominal surgeries include abdominal hysterectomy and laparoscopic cholecystectomy.  She also has remote history of bladder cancer status post transurethral resection.  Patient denies any daily medications.  She does receive IV infusions for her psoriatic arthritis every 4 weeks with the last infusion this past Friday. No blood thinning medications. Occasional alcohol use, denies illicit drug or tobacco use.  ROS: Review of Systems  Constitutional:  Positive for chills. Negative for fever.  Respiratory:  Negative for cough and shortness of breath.   Cardiovascular:  Negative for chest pain.  Gastrointestinal:  Positive for abdominal pain and nausea. Negative for constipation, diarrhea and vomiting.  Genitourinary: Negative.   Psychiatric/Behavioral:  Negative for substance abuse.   All other systems reviewed and are negative.  Family History  Problem Relation Age of Onset   Arrhythmia Brother     Arrhythmia Brother    Heart disease Father    Hypertension Father    Heart attack Father    Diabetes Mother    Diabetes Maternal Grandfather    Diabetes Paternal Grandmother    Breast cancer Maternal Aunt     Past Medical History:  Diagnosis Date   Anxiety    Bladder cancer [facility]) urologist-  dr Alyson Ingles   dx 06/ 2016   History of diverticulitis of colon    Aug 2011--  resolved no surgical intervention   History of exercise stress test    Abnormal stress ehco  09-23-2013---  negative ischemia w/  no ST changes,  after exercise there was hypokinesis in the mid/distal lateral wall and apex consistent with ischemia Positive stress echo   History of gestational diabetes    History of hypertension    PT CHANGED STRESSFUL JOB AND HYPERTENSION RESOLVED , NO MEDICATION SINCE 07/ 2018 APPROX.   History of small bowel obstruction    09-01-2014--  per CT adhesions--  resolved without surgical intervention   Hyperlipidemia    OA (osteoarthritis)    Palpitations    PVC (premature ventricular contraction)    Seasonal allergic rhinitis    Wears glasses     Past Surgical History:  Procedure Laterality Date   BREAST BIOPSY     CHOLECYSTECTOMY N/A 10/16/2013   Procedure: LAPAROSCOPIC CHOLECYSTECTOMY WITH INTRAOPERATIVE CHOLANGIOGRAM;  Surgeon: Rolm Bookbinder, MD;  Location: [place];  Service: General;  Laterality: N/A;   COLONOSCOPY  11-09-2010   CYSTOSCOPY W/ RETROGRADES Bilateral 05/18/2015   Procedure: CYSTOSCOPY WITH  BILATERAL RETROGRADE PYELOGRAM;  Surgeon: Cleon Gustin, MD;  Location: [place];  Service: Urology;  Laterality: Bilateral;   CYSTOSCOPY W/ RETROGRADES Bilateral 11/22/2017   Procedure: CYSTOSCOPY WITH RETROGRADE PYELOGRAM;  Surgeon: Cleon Gustin, MD;  Location[facility];  Service: Urology;  Laterality: Bilateral;   LEFT HEART CATHETERIZATION WITH CORONARY ANGIOGRAM N/A 10/07/2013   Procedure: LEFT HEART CATHETERIZATION WITH  CORONARY ANGIOGRAM;  Surgeon: Peter M Martinique, MD;  Location: [facility] CATH LAB;  Service: Cardiovascular;  Laterality: N/A;  Normal coronary arteries;  Normal LV function, ef 55-65%   TEMPOROMANDIBULAR JOINT SURGERY  1987   TOTAL ABDOMINAL HYSTERECTOMY  06-17-2003   w/ Right Ovarian Cystectomy and McCall Culdoplasty   TRANSURETHRAL RESECTION OF BLADDER TUMOR N/A 11/22/2017   Procedure: TRANSURETHRAL RESECTION OF BLADDER TUMOR (TURBT);  Surgeon: Cleon Gustin, MD;  Location: [facility];  Service: Urology;  Laterality: N/A;   TRANSURETHRAL RESECTION OF BLADDER TUMOR WITH GYRUS (TURBT-GYRUS) N/A 05/18/2015   Procedure: TRANSURETHRAL RESECTION OF BLADDER TUMOR WITH GYRUS (TURBT-GYRUS);  Surgeon: Cleon Gustin, MD;  Location: [facility];  Service: Urology;  Laterality: N/A;   URETEROSCOPY Bilateral 05/18/2015   Procedure:  ATTEMPTED URETEROSCOPY LEFT;  Surgeon: Cleon Gustin, MD;  Location: [facility];  Service: Urology;  Laterality: Bilateral;    Social History:  reports that she quit smoking about 27 years ago. Her smoking use included cigarettes. She has a 35.00 pack-year smoking history. She has never used smokeless tobacco. She reports current alcohol use of about 2.0 standard drinks per week. She reports that she does not use drugs.  Allergies:  Allergies  Allergen Reactions   Codeine Shortness Of Breath and Swelling   Latex Itching   Lipitor [Atorvastatin] Other (See Comments)    confusion   Lisinopril Other (See Comments)    cough   Onion Nausea And Vomiting    Raw Acute pain in gastric area   Wellbutrin [Bupropion] Other (See Comments)    Worsened anxiety   Zocor [Simvastatin] Other (See Comments)    Confusion/ sleepy   Zoloft [Sertraline] Other (See Comments)    Worsened anxiety    (Not in a hospital admission)    Physical Exam: Blood pressure (!) 165/89, pulse 82, temperature 97.8 F (36.6 C), temperature source  Oral, resp. rate 18, height 5\' 7"  (1.702 m), weight 72.6 kg, SpO2 92 %. General: pleasant, WD/WN white female who is laying in bed in NAD HEENT: head is normocephalic, atraumatic.  Sclera are noninjected.  PERRL.  Ears and nose without any masses or lesions.  Mouth is pink and moist. Dentition fair Heart: regular, rate, and rhythm.  Normal s1,s2. No obvious murmurs, gallops, or rubs noted.  Palpable pedal pulses bilaterally  Lungs: CTAB, no wheezes, rhonchi, or rales noted.  Respiratory effort nonlabored Abd: Soft, moderate upper abdominal distension, generalized tenderness greatest in the epigastrium and periumbilical abdomen without peritonitis. Hypoactive BS. No masses, hernias, or organomegaly. NGT in place with thin yellow output in cannister MS: no BUE/BLE edema, calves soft and nontender Skin: warm and dry with no masses, lesions, or rashes Psych: A&Ox4 with an appropriate affect Neuro: cranial nerves grossly intact, equal strength in BUE/BLE bilaterally, normal speech, thought process intact, moves all extremities, gait not assessed   Results for orders placed or performed during the hospital encounter of 11/06/21 (from the past 48 hour(s))  CBC with Differential     Status: None   Collection Time: 11/06/21  6:49 AM  Result Value Ref Range  WBC 9.7 4.0 - 10.5 K/uL   RBC 4.84 3.87 - 5.11 MIL/uL   Hemoglobin 14.6 12.0 - 15.0 g/dL   HCT 44.6 36.0 - 46.0 %   MCV 92.1 80.0 - 100.0 fL   MCH 30.2 26.0 - 34.0 pg   MCHC 32.7 30.0 - 36.0 g/dL   RDW 12.8 11.5 - 15.5 %   Platelets 233 150 - 400 K/uL   nRBC 0.0 0.0 - 0.2 %   Neutrophils Relative % 71 %   Neutro Abs 6.9 1.7 - 7.7 K/uL   Lymphocytes Relative 22 %   Lymphs Abs 2.1 0.7 - 4.0 K/uL   Monocytes Relative 5 %   Monocytes Absolute 0.4 0.1 - 1.0 K/uL   Eosinophils Relative 1 %   Eosinophils Absolute 0.1 0.0 - 0.5 K/uL   Basophils Relative 1 %   Basophils Absolute 0.1 0.0 - 0.1 K/uL   Immature Granulocytes 0 %   Abs Immature  Granulocytes 0.03 0.00 - 0.07 K/uL    Comment: Performed at [facility], [place] [address], [place], [place] [address]  Comprehensive metabolic panel     Status: Abnormal   Collection Time: 11/06/21  6:49 AM  Result Value Ref Range   Sodium 137 135 - 145 mmol/L   Potassium 4.0 3.5 - 5.1 mmol/L   Chloride 104 98 - 111 mmol/L   CO2 24 22 - 32 mmol/L   Glucose, Bld 134 (H) 70 - 99 mg/dL    Comment: Glucose reference range applies only to samples taken after fasting for at least 8 hours.   BUN 19 8 - 23 mg/dL   Creatinine, Ser 0.93 0.44 - 1.00 mg/dL   Calcium 9.5 8.9 - 10.3 mg/dL   Total Protein 7.5 6.5 - 8.1 g/dL   Albumin 4.4 3.5 - 5.0 g/dL   AST 16 15 - 41 U/L   ALT 17 0 - 44 U/L   Alkaline Phosphatase 96 38 - 126 U/L   Total Bilirubin 0.3 0.3 - 1.2 mg/dL   GFR, Estimated >60 >60 mL/min    Comment: (NOTE) Calculated using the CKD-EPI Creatinine Equation (2021)    Anion gap 9 5 - 15    Comment: Performed at [facility], [place] [address], [place], [place] [address]   CT ABDOMEN PELVIS W CONTRAST  Result Date: 11/06/2021 CLINICAL DATA:  70 year old female with abdominal pain over night. History of bowel obstruction and bladder cancer. EXAM: CT ABDOMEN AND PELVIS WITH CONTRAST TECHNIQUE: Multidetector CT imaging of the abdomen and pelvis was performed using the standard protocol following bolus administration of intravenous contrast. CONTRAST:  62mL OMNIPAQUE IOHEXOL 350 MG/ML SOLN COMPARISON:  CT Abdomen and Pelvis 05/28/2019 and earlier. FINDINGS: Lower chest: Similar lung base atelectasis. No pericardial or pleural effusion. Hepatobiliary: Chronically absent gallbladder.  Negative liver. Pancreas: Negative. Spleen: Negative. Adrenals/Urinary Tract: Normal adrenal glands. Kidneys appears stable and nonobstructed. Unremarkable bladder. Stable pelvic phleboliths. Stomach/Bowel: Moderate diverticulosis of the descending and sigmoid colon. Intermittent  diverticula also in the transverse colon. Largely decompressed large bowel. There is some retained gas and stool in the right colon. Evidence of a normal appendix on series 2, image 61. Decompressed terminal ileum and small bowel loops in the right lower quadrant. Dilated upstream small bowel loops up to 32 mm diameter contain fluid, in total loop with flocculated contents (series 2, image 74 which demonstrates an abrupt transition to decompressed but thick-walled small bowel in the right lower abdomen (series 2, image 78  and coronal image 46. From there the distal small bowel appears thickened and inflamed but remains decompressed in the right lower quadrant. The terminal ileum does not appear inflamed. Scattered free fluid in the affected distal small bowel mesentery. Decompressed stomach, duodenum, and proximal jejunum. No free air. Vascular/Lymphatic: Major arterial structures in the abdomen and pelvis remain patent. Aortoiliac calcified atherosclerosis. Portal venous system is patent. No lymphadenopathy. Reproductive: Surgically absent uterus as before. Diminutive or absent ovaries. Other: Small volume pelvic free fluid with simple fluid density, similar to that in July. Musculoskeletal: Lumbar facet degeneration. No acute or suspicious osseous lesion. IMPRESSION: 1. Recurrent Distal Small Bowel Obstruction. Fairly abrupt transition in the right lower quadrant where thick-walled and inflamed loops then remain decompressed to the terminal ileum. Therefore, consider a bowel obstruction secondary to Enteritis rather than adhesions. No overtly malignant features of the abnormal loops. Flocculated contents just upstream of the transition. Small volume of simple density free fluid in the lower small bowel mesentery and the pelvis. No free air. 2. No other acute or metastatic process identified in the abdomen or pelvis. Aortic Atherosclerosis (ICD10-I70.0). Electronically Signed   By: Genevie Ann M.D.   On: 11/06/2021  08:42   DG Abd Portable 1V  Result Date: 11/06/2021 CLINICAL DATA:  Nasogastric tube placement. EXAM: PORTABLE ABDOMEN - 1 VIEW COMPARISON:  None. FINDINGS: Nasogastric terminates proximally 3-4 cm beyond the gastroesophageal junction. Bowel gas pattern is unremarkable. Visualized lung bases are grossly clear. IMPRESSION: Nasogastric terminates approximately 3-4 cm beyond the gastroesophageal junction. Advancing several cm showed better position the tip within the stomach. Electronically Signed   By: Lorin Picket M.D.   On: 11/06/2021 10:51    Anti-infectives (From admission, onward)    None       Assessment/Plan SBO - CT w/ Distal Small Bowel Obstruction with transition in the RLQ where thick-walled and inflamed loops then remain decompressed to the terminal ileum.  - No current indication for emergency surgery - Place NGT for decompression and keep NPO - Start SBO protocol. RN advancing NGT. Will reverify position before gastrografin - Keep K > 4 and Mg > 2 for bowel function - Mobilize for bowel function - Hopefully patient will improve with conservative management. If patient fails to improve with conservative management, they may require exploratory surgery during admission - Admit to outpatient  FEN - NPO, IVF, NGT VTE - SCD's, Lovenox ID - None  Hx of Psoratic Arthritis - gets IV infusion every 4 weeks. Last infusion this past friday Patient is overdue for colonoscopy. Recommend follow up with her GI doctor in 6-8 weeks if this resolves with conservative management to have this scheduled  Jillyn Ledger, [facility] Surgery 11/06/2021, 11:33 AM Please see Amion for pager number during day hours 7:00am-4:30pm

## 2021-11-06 NOTE — ED Triage Notes (Signed)
Pt reports with mid upper abdominal pain since last night. Pt states that she has a hx of bowel obstruction and states that this is what it feels like.

## 2021-11-06 NOTE — ED Provider Notes (Signed)
[place] DEPT Provider Note   CSN: [id number] Arrival date & time: 11/06/21  0631     History Chief Complaint  Patient presents with   bowel obstruction   Abdominal Pain    Christina Campos is a 70 y.o. female.  The history is provided by the patient and medical records.  Abdominal Pain Pain location:  Epigastric and periumbilical Pain quality: cramping, gnawing, sharp and shooting   Pain radiates to:  Does not radiate Pain severity:  Moderate Onset quality:  Sudden Duration:  12 hours Timing:  Constant Progression:  Unchanged Chronicity:  Recurrent Context comment:  Patient has prior history of small bowel obstructions.  She reports she was eating turnip greens last night and shortly after eating she developed this pain and nausea in her upper abdomen Relieved by:  Nothing Worsened by:  Movement and palpation Ineffective treatments: anti-emetic. Associated symptoms: anorexia and nausea   Associated symptoms: no cough, no diarrhea, no dysuria, no fever, no hematemesis, no shortness of breath and no vomiting   Risk factors comment:  Prior sbo     Past Medical History:  Diagnosis Date   Anxiety    Bladder cancer [facility]) urologist-  dr Alyson Ingles   dx 06/ 2016   History of diverticulitis of colon    Aug 2011--  resolved no surgical intervention   History of exercise stress test    Abnormal stress ehco  09-23-2013---  negative ischemia w/  no ST changes,  after exercise there was hypokinesis in the mid/distal lateral wall and apex consistent with ischemia Positive stress echo   History of gestational diabetes    History of hypertension    PT CHANGED STRESSFUL JOB AND HYPERTENSION RESOLVED , NO MEDICATION SINCE 07/ 2018 APPROX.   History of small bowel obstruction    09-01-2014--  per CT adhesions--  resolved without surgical intervention   Hyperlipidemia    OA (osteoarthritis)    Palpitations    PVC (premature ventricular contraction)     Seasonal allergic rhinitis    Wears glasses     Patient Active Problem List   Diagnosis Date Noted   SBO (small bowel obstruction) ([place]) 09/01/2014   Asthma, chronic 09/01/2014   Leukocytosis 09/01/2014   Hyperglycemia 09/01/2014   PVC (premature ventricular contraction) 09/02/2013   Chest tightness 09/02/2013   HTN (hypertension) 09/02/2013   Hypercholesterolemia 09/02/2013    Past Surgical History:  Procedure Laterality Date   BREAST BIOPSY     CHOLECYSTECTOMY N/A 10/16/2013   Procedure: LAPAROSCOPIC CHOLECYSTECTOMY WITH INTRAOPERATIVE CHOLANGIOGRAM;  Surgeon: Rolm Bookbinder, MD;  Location: [place];  Service: General;  Laterality: N/A;   COLONOSCOPY  11-09-2010   CYSTOSCOPY W/ RETROGRADES Bilateral 05/18/2015   Procedure: CYSTOSCOPY WITH  BILATERAL RETROGRADE PYELOGRAM;  Surgeon: Cleon Gustin, MD;  Location: [facility];  Service: Urology;  Laterality: Bilateral;   CYSTOSCOPY W/ RETROGRADES Bilateral 11/22/2017   Procedure: CYSTOSCOPY WITH RETROGRADE PYELOGRAM;  Surgeon: Cleon Gustin, MD;  Location: [facility];  Service: Urology;  Laterality: Bilateral;   LEFT HEART CATHETERIZATION WITH CORONARY ANGIOGRAM N/A 10/07/2013   Procedure: LEFT HEART CATHETERIZATION WITH CORONARY ANGIOGRAM;  Surgeon: Peter M Martinique, MD;  Location: [facility] CATH LAB;  Service: Cardiovascular;  Laterality: N/A;  Normal coronary arteries;  Normal LV function, ef 55-65%   TEMPOROMANDIBULAR JOINT SURGERY  1987   TOTAL ABDOMINAL HYSTERECTOMY  06-17-2003   w/ Right Ovarian Cystectomy and McCall Culdoplasty   TRANSURETHRAL RESECTION OF BLADDER TUMOR  N/A 11/22/2017   Procedure: TRANSURETHRAL RESECTION OF BLADDER TUMOR (TURBT);  Surgeon: Cleon Gustin, MD;  Location: [facility];  Service: Urology;  Laterality: N/A;   TRANSURETHRAL RESECTION OF BLADDER TUMOR WITH GYRUS (TURBT-GYRUS) N/A 05/18/2015   Procedure: TRANSURETHRAL RESECTION OF BLADDER TUMOR  WITH GYRUS (TURBT-GYRUS);  Surgeon: Cleon Gustin, MD;  Location: [facility];  Service: Urology;  Laterality: N/A;   URETEROSCOPY Bilateral 05/18/2015   Procedure:  ATTEMPTED URETEROSCOPY LEFT;  Surgeon: Cleon Gustin, MD;  Location: [facility];  Service: Urology;  Laterality: Bilateral;     OB History   No obstetric history on file.     Family History  Problem Relation Age of Onset   Arrhythmia Brother    Arrhythmia Brother    Heart disease Father    Hypertension Father    Heart attack Father    Diabetes Mother    Diabetes Maternal Grandfather    Diabetes Paternal Grandmother    Breast cancer Maternal Aunt     Social History   Tobacco Use   Smoking status: Former    Packs/day: 1.00    Years: 35.00    Pack years: 35.00    Types: Cigarettes    Quit date: 12/04/1993    Years since quitting: 27.9   Smokeless tobacco: Never  Vaping Use   Vaping Use: Never used  Substance Use Topics   Alcohol use: Yes    Alcohol/week: 2.0 standard drinks    Types: 2 Glasses of wine per week   Drug use: No    Home Medications Prior to Admission medications   Medication Sig Start Date End Date Taking? Authorizing Provider  acetaminophen (TYLENOL) 325 MG tablet Take 650 mg by mouth every 6 (six) hours as needed.    [provider]  estrogens, conjugated, (PREMARIN) 0.625 MG tablet Take 0.625 mg by mouth every evening.     [provider]  ibuprofen (ADVIL,MOTRIN) 200 MG tablet Take 400 mg by mouth every 6 (six) hours as needed for pain.     [provider]    Allergies    Codeine, Latex, Lipitor [atorvastatin], Lisinopril, Onion, Wellbutrin [bupropion], Zocor [simvastatin], and Zoloft [sertraline]  Review of Systems   Review of Systems  Constitutional:  Negative for fever.  Respiratory:  Negative for cough and shortness of breath.   Gastrointestinal:  Positive for abdominal pain, anorexia and nausea. Negative for  diarrhea, hematemesis and vomiting.  Genitourinary:  Negative for dysuria.  All other systems reviewed and are negative.  Physical Exam Updated Vital Signs BP (!) 155/85   Pulse 80   Temp 97.8 F (36.6 C) (Oral)   Resp 20   Ht 5\' 7"  (1.702 m)   Wt 72.6 kg   SpO2 96%   BMI 25.06 kg/m   Physical Exam Vitals and nursing note reviewed.  Constitutional:      General: She is not in acute distress.    Appearance: She is well-developed.  HENT:     Head: Normocephalic and atraumatic.  Eyes:     Conjunctiva/sclera: Conjunctivae normal.     Pupils: Pupils are equal, round, and reactive to light.  Cardiovascular:     Rate and Rhythm: Regular rhythm. Tachycardia present.     Pulses: Normal pulses.     Heart sounds: No murmur heard. Pulmonary:     Effort: Pulmonary effort is normal. No respiratory distress.     Breath sounds: Normal breath sounds. No wheezing or rales.  Abdominal:     General: Bowel sounds are decreased. There is no distension.     Palpations: Abdomen is soft.     Tenderness: There is abdominal tenderness in the epigastric area and periumbilical area. There is guarding. There is no right CVA tenderness, left CVA tenderness or rebound.  Musculoskeletal:        General: No tenderness. Normal range of motion.     Cervical back: Normal range of motion and neck supple.  Skin:    General: Skin is warm and dry.     Capillary Refill: Capillary refill takes less than 2 seconds.     Findings: No erythema or rash.  Neurological:     Mental Status: She is alert and oriented to person, place, and time. Mental status is at baseline.  Psychiatric:        Mood and Affect: Mood normal.        Behavior: Behavior normal.    ED Results / Procedures / Treatments   Labs (all labs ordered are listed, but only abnormal results are displayed) Labs Reviewed  COMPREHENSIVE METABOLIC PANEL - Abnormal; Notable for the following components:      Result Value   Glucose, Bld 134 (*)     All other components within normal limits  CBC WITH DIFFERENTIAL/PLATELET    EKG None  Radiology CT ABDOMEN PELVIS W CONTRAST  Result Date: 11/06/2021 CLINICAL DATA:  70 year old female with abdominal pain over night. History of bowel obstruction and bladder cancer. EXAM: CT ABDOMEN AND PELVIS WITH CONTRAST TECHNIQUE: Multidetector CT imaging of the abdomen and pelvis was performed using the standard protocol following bolus administration of intravenous contrast. CONTRAST:  52mL OMNIPAQUE IOHEXOL 350 MG/ML SOLN COMPARISON:  CT Abdomen and Pelvis 05/28/2019 and earlier. FINDINGS: Lower chest: Similar lung base atelectasis. No pericardial or pleural effusion. Hepatobiliary: Chronically absent gallbladder.  Negative liver. Pancreas: Negative. Spleen: Negative. Adrenals/Urinary Tract: Normal adrenal glands. Kidneys appears stable and nonobstructed. Unremarkable bladder. Stable pelvic phleboliths. Stomach/Bowel: Moderate diverticulosis of the descending and sigmoid colon. Intermittent diverticula also in the transverse colon. Largely decompressed large bowel. There is some retained gas and stool in the right colon. Evidence of a normal appendix on series 2, image 61. Decompressed terminal ileum and small bowel loops in the right lower quadrant. Dilated upstream small bowel loops up to 32 mm diameter contain fluid, in total loop with flocculated contents (series 2, image 74 which demonstrates an abrupt transition to decompressed but thick-walled small bowel in the right lower abdomen (series 2, image 78 and coronal image 46. From there the distal small bowel appears thickened and inflamed but remains decompressed in the right lower quadrant. The terminal ileum does not appear inflamed. Scattered free fluid in the affected distal small bowel mesentery. Decompressed stomach, duodenum, and proximal jejunum. No free air. Vascular/Lymphatic: Major arterial structures in the abdomen and pelvis remain patent.  Aortoiliac calcified atherosclerosis. Portal venous system is patent. No lymphadenopathy. Reproductive: Surgically absent uterus as before. Diminutive or absent ovaries. Other: Small volume pelvic free fluid with simple fluid density, similar to that in July. Musculoskeletal: Lumbar facet degeneration. No acute or suspicious osseous lesion. IMPRESSION: 1. Recurrent Distal Small Bowel Obstruction. Fairly abrupt transition in the right lower quadrant where thick-walled and inflamed loops then remain decompressed to the terminal ileum. Therefore, consider a bowel obstruction secondary to Enteritis rather than adhesions. No overtly malignant features of the abnormal loops. Flocculated contents just upstream of the transition. Small volume of simple density free fluid  in the lower small bowel mesentery and the pelvis. No free air. 2. No other acute or metastatic process identified in the abdomen or pelvis. Aortic Atherosclerosis (ICD10-I70.0). Electronically Signed   By: Genevie Ann M.D.   On: 11/06/2021 08:42    Procedures Procedures   Medications Ordered in ED Medications  iohexol (OMNIPAQUE) 350 MG/ML injection 80 mL (has no administration in time range)  lactated ringers bolus 1,000 mL (1,000 mLs Intravenous New Bag/Given 11/06/21 0748)  metoCLOPramide (REGLAN) injection 10 mg (10 mg Intravenous Given 11/06/21 0748)  morphine 4 MG/ML injection 4 mg (4 mg Intravenous Given 11/06/21 0747)    ED Course  I have reviewed the triage vital signs and the nursing notes.  Pertinent labs & imaging results that were available during my care of the patient were reviewed by me and considered in my medical decision making (see chart for details).    MDM Rules/Calculators/A&P                           Patient is a 70 year old female with history of recurrent small bowel obstructions who is presenting today with 12 hours of abdominal pain and nausea.  She reports this feels just like her prior small bowel  obstructions.  She has not had any bowel movement since the pain started but did report having small amount of flatus this morning.  She denies any urinary symptoms, infectious symptoms, respiratory or cardiac symptoms.  Suspect recurrent small bowel obstruction.  Patient given IV fluid, nausea and pain control.  CBC, CMP are within normal limits.  CT is pending.  9:14 AM CT is consistent with recurrent distal small bowel obstruction with an abrupt transition point in the right lower quadrant no overtly malignant features are present but may be related to enteritis.  Patient is still having pain and nausea.  She was started on LR infusion given more pain medication and general surgery to come and evaluate.  MDM   Amount and/or Complexity of Data Reviewed Clinical lab tests: reviewed and ordered Tests in the radiology section of CPT: ordered and reviewed Independent visualization of images, tracings, or specimens: yes    Final Clinical Impression(s) / ED Diagnoses Final diagnoses:  SBO (small bowel obstruction) ([place])    Rx / DC Orders ED Discharge Orders     None        Blanchie Dessert, MD 11/06/21 [id number]

## 2021-11-06 NOTE — ED Notes (Signed)
Pt back from CT

## 2021-11-06 NOTE — ED Notes (Signed)
Attempted to call husband Francee Piccolo to notify pt new room number. No answer and mailbox was full.

## 2021-11-06 NOTE — ED Notes (Signed)
Patient transported to CT 

## 2021-11-07 ENCOUNTER — Inpatient Hospital Stay (HOSPITAL_COMMUNITY): Payer: Medicare Other

## 2021-11-07 ENCOUNTER — Observation Stay (HOSPITAL_COMMUNITY): Payer: Medicare Other

## 2021-11-07 DIAGNOSIS — Z833 Family history of diabetes mellitus: Secondary | ICD-10-CM | POA: Diagnosis not present

## 2021-11-07 DIAGNOSIS — Z9049 Acquired absence of other specified parts of digestive tract: Secondary | ICD-10-CM | POA: Diagnosis not present

## 2021-11-07 DIAGNOSIS — K5669 Other partial intestinal obstruction: Secondary | ICD-10-CM | POA: Diagnosis not present

## 2021-11-07 DIAGNOSIS — E7800 Pure hypercholesterolemia, unspecified: Secondary | ICD-10-CM | POA: Diagnosis present

## 2021-11-07 DIAGNOSIS — Z8551 Personal history of malignant neoplasm of bladder: Secondary | ICD-10-CM | POA: Diagnosis not present

## 2021-11-07 DIAGNOSIS — Z885 Allergy status to narcotic agent status: Secondary | ICD-10-CM | POA: Diagnosis not present

## 2021-11-07 DIAGNOSIS — Z91018 Allergy to other foods: Secondary | ICD-10-CM | POA: Diagnosis not present

## 2021-11-07 DIAGNOSIS — K56609 Unspecified intestinal obstruction, unspecified as to partial versus complete obstruction: Secondary | ICD-10-CM | POA: Diagnosis present

## 2021-11-07 DIAGNOSIS — Z0389 Encounter for observation for other suspected diseases and conditions ruled out: Secondary | ICD-10-CM | POA: Diagnosis not present

## 2021-11-07 DIAGNOSIS — R109 Unspecified abdominal pain: Secondary | ICD-10-CM | POA: Diagnosis not present

## 2021-11-07 DIAGNOSIS — Z803 Family history of malignant neoplasm of breast: Secondary | ICD-10-CM | POA: Diagnosis not present

## 2021-11-07 DIAGNOSIS — Z79899 Other long term (current) drug therapy: Secondary | ICD-10-CM | POA: Diagnosis not present

## 2021-11-07 DIAGNOSIS — Z91040 Latex allergy status: Secondary | ICD-10-CM | POA: Diagnosis not present

## 2021-11-07 DIAGNOSIS — J45909 Unspecified asthma, uncomplicated: Secondary | ICD-10-CM | POA: Diagnosis present

## 2021-11-07 DIAGNOSIS — Z888 Allergy status to other drugs, medicaments and biological substances status: Secondary | ICD-10-CM | POA: Diagnosis not present

## 2021-11-07 DIAGNOSIS — Z90710 Acquired absence of both cervix and uterus: Secondary | ICD-10-CM | POA: Diagnosis not present

## 2021-11-07 DIAGNOSIS — E785 Hyperlipidemia, unspecified: Secondary | ICD-10-CM | POA: Diagnosis present

## 2021-11-07 DIAGNOSIS — L4050 Arthropathic psoriasis, unspecified: Secondary | ICD-10-CM | POA: Diagnosis present

## 2021-11-07 DIAGNOSIS — Z87891 Personal history of nicotine dependence: Secondary | ICD-10-CM | POA: Diagnosis not present

## 2021-11-07 DIAGNOSIS — Z8249 Family history of ischemic heart disease and other diseases of the circulatory system: Secondary | ICD-10-CM | POA: Diagnosis not present

## 2021-11-07 LAB — CBC
HCT: 38.4 % (ref 36.0–46.0)
Hemoglobin: 12.6 g/dL (ref 12.0–15.0)
MCH: 30.1 pg (ref 26.0–34.0)
MCHC: 32.8 g/dL (ref 30.0–36.0)
MCV: 91.6 fL (ref 80.0–100.0)
Platelets: 170 10*3/uL (ref 150–400)
RBC: 4.19 MIL/uL (ref 3.87–5.11)
RDW: 13.2 % (ref 11.5–15.5)
WBC: 10.9 10*3/uL — ABNORMAL HIGH (ref 4.0–10.5)
nRBC: 0 % (ref 0.0–0.2)

## 2021-11-07 LAB — BASIC METABOLIC PANEL
Anion gap: 5 (ref 5–15)
BUN: 15 mg/dL (ref 8–23)
CO2: 28 mmol/L (ref 22–32)
Calcium: 8.6 mg/dL — ABNORMAL LOW (ref 8.9–10.3)
Chloride: 105 mmol/L (ref 98–111)
Creatinine, Ser: 0.77 mg/dL (ref 0.44–1.00)
GFR, Estimated: >60 mL/min (ref >60)
Glucose, Bld: 120 mg/dL — ABNORMAL HIGH (ref 70–99)
Potassium: 4 mmol/L (ref 3.5–5.1)
Sodium: 138 mmol/L (ref 135–145)

## 2021-11-07 LAB — PHOSPHORUS: Phosphorus: 3.9 mg/dL (ref 2.5–4.6)

## 2021-11-07 LAB — MAGNESIUM: Magnesium: 2 mg/dL (ref 1.7–2.4)

## 2021-11-07 MED ORDER — LIP MEDEX EX OINT
TOPICAL_OINTMENT | CUTANEOUS | Status: AC
  Filled 2021-11-07: qty 7

## 2021-11-07 MED ORDER — DIATRIZOATE MEGLUMINE & SODIUM 66-10 % PO SOLN
90.0000 mL | Freq: Once | ORAL | Status: AC
  Administered 2021-11-07: 90 mL via NASOGASTRIC
  Filled 2021-11-07: qty 90

## 2021-11-07 MED ORDER — PANTOPRAZOLE SODIUM 40 MG IV SOLR
40.0000 mg | Freq: Every day | INTRAVENOUS | Status: DC
  Administered 2021-11-08 – 2021-11-09 (×3): 40 mg via INTRAVENOUS
  Filled 2021-11-07 (×3): qty 40

## 2021-11-07 NOTE — Progress Notes (Signed)
[facility] Progress Note     Subjective: CC: heartburn Ongoing mild abdominal pain. Denies flatus or BM. Reports emesis around NG tube after administration of gastrografin yesterday.   Objective: Vital signs in last 24 hours: Temp:  [98.3 F (36.8 C)-98.5 F (36.9 C)] 98.3 F (36.8 C) (12/13 0900) Pulse Rate:  [79-92] 81 (12/13 0900) Resp:  [16-18] 18 (12/13 0900) BP: (113-177)/(61-96) 136/61 (12/13 0900) SpO2:  [90 %-99 %] 94 % (12/13 0900) Last BM Date: 11/05/21  Intake/Output from previous day: 12/12 0701 - 12/13 0700 In: 2127.7 [I.V.:2127.7] Out: 600 [Urine:600] Intake/Output this shift: No intake/output data recorded.  PE: Gen:  Alert, NAD, pleasant Card:  Regular rate and rhythm, pedal pulses 2+ BL Pulm:  Normal effort, clear to auscultation bilaterally Abd: Soft, mild distention, globally tender without rebound tenderness or peritonitis, hypoactive BS Skin: warm and dry, no rashes  Psych: A&Ox3   Lab Results:  Recent Labs    11/06/21 0649 11/07/21 0442  WBC 9.7 10.9*  HGB 14.6 12.6  HCT 44.6 38.4  PLT 233 170   BMET Recent Labs    11/06/21 0649 11/07/21 0442  NA 137 138  K 4.0 4.0  CL 104 105  CO2 24 28  GLUCOSE 134* 120*  BUN 19 15  CREATININE 0.93 0.77  CALCIUM 9.5 8.6*   PT/INR No results for input(s): LABPROT, INR in the last 72 hours. CMP     Component Value Date/Time   NA 138 11/07/2021 0442   K 4.0 11/07/2021 0442   CL 105 11/07/2021 0442   CO2 28 11/07/2021 0442   GLUCOSE 120 (H) 11/07/2021 0442   BUN 15 11/07/2021 0442   CREATININE 0.77 11/07/2021 0442   CALCIUM 8.6 (L) 11/07/2021 0442   PROT 7.5 11/06/2021 0649   ALBUMIN 4.4 11/06/2021 0649   AST 16 11/06/2021 0649   ALT 17 11/06/2021 0649   ALKPHOS 96 11/06/2021 0649   BILITOT 0.3 11/06/2021 0649   GFRNONAA >60 11/07/2021 0442   GFRAA >60 05/29/2019 0359   Lipase     Component Value Date/Time   LIPASE 34 05/28/2019 1100       Studies/Results: CT  ABDOMEN PELVIS W CONTRAST  Result Date: 11/06/2021 CLINICAL DATA:  70 year old female with abdominal pain over night. History of bowel obstruction and bladder cancer. EXAM: CT ABDOMEN AND PELVIS WITH CONTRAST TECHNIQUE: Multidetector CT imaging of the abdomen and pelvis was performed using the standard protocol following bolus administration of intravenous contrast. CONTRAST:  86mL OMNIPAQUE IOHEXOL 350 MG/ML SOLN COMPARISON:  CT Abdomen and Pelvis 05/28/2019 and earlier. FINDINGS: Lower chest: Similar lung base atelectasis. No pericardial or pleural effusion. Hepatobiliary: Chronically absent gallbladder.  Negative liver. Pancreas: Negative. Spleen: Negative. Adrenals/Urinary Tract: Normal adrenal glands. Kidneys appears stable and nonobstructed. Unremarkable bladder. Stable pelvic phleboliths. Stomach/Bowel: Moderate diverticulosis of the descending and sigmoid colon. Intermittent diverticula also in the transverse colon. Largely decompressed large bowel. There is some retained gas and stool in the right colon. Evidence of a normal appendix on series 2, image 61. Decompressed terminal ileum and small bowel loops in the right lower quadrant. Dilated upstream small bowel loops up to 32 mm diameter contain fluid, in total loop with flocculated contents (series 2, image 74 which demonstrates an abrupt transition to decompressed but thick-walled small bowel in the right lower abdomen (series 2, image 78 and coronal image 46. From there the distal small bowel appears thickened and inflamed but remains decompressed in the right lower quadrant. The  terminal ileum does not appear inflamed. Scattered free fluid in the affected distal small bowel mesentery. Decompressed stomach, duodenum, and proximal jejunum. No free air. Vascular/Lymphatic: Major arterial structures in the abdomen and pelvis remain patent. Aortoiliac calcified atherosclerosis. Portal venous system is patent. No lymphadenopathy. Reproductive: Surgically  absent uterus as before. Diminutive or absent ovaries. Other: Small volume pelvic free fluid with simple fluid density, similar to that in July. Musculoskeletal: Lumbar facet degeneration. No acute or suspicious osseous lesion. IMPRESSION: 1. Recurrent Distal Small Bowel Obstruction. Fairly abrupt transition in the right lower quadrant where thick-walled and inflamed loops then remain decompressed to the terminal ileum. Therefore, consider a bowel obstruction secondary to Enteritis rather than adhesions. No overtly malignant features of the abnormal loops. Flocculated contents just upstream of the transition. Small volume of simple density free fluid in the lower small bowel mesentery and the pelvis. No free air. 2. No other acute or metastatic process identified in the abdomen or pelvis. Aortic Atherosclerosis (ICD10-I70.0). Electronically Signed   By: Genevie Ann M.D.   On: 11/06/2021 08:42   DG Abd Portable 1V  Result Date: 11/07/2021 CLINICAL DATA:  Abdominal pain EXAM: PORTABLE ABDOMEN - 1 VIEW COMPARISON:  11/06/2021 FINDINGS: Tip of enteric tube is seen in the medial aspect of the fundus of the stomach. Side-port in the enteric tube is noted in the lumen of lower thoracic esophagus. There are fluid-filled small bowel loops measuring up to 5.1 cm in diameter. Gas and stool are present in colon. There is no fecal impaction in the rectum. There is contrast in urinary bladder from previous CT. IMPRESSION: There are dilated fluid-filled small bowel loops suggesting possible partial small bowel obstruction or ileus. Gas and stool are noted in the colon and rectum. Stomach is not distended. Side-port in the enteric tube is seen in the lower thoracic esophagus. Enteric tube should be advanced approximately 10 cm. These results will be called to the ordering clinician or representative by the Radiologist Assistant, and communication documented in the PACS or [employer]. Electronically Signed   By: Elmer Picker M.D.   On: 11/07/2021 08:07   DG Abd Portable 1V-Small Bowel Obstruction Protocol-initial, 8 hr delay  Result Date: 11/06/2021 CLINICAL DATA:  Small bowel protocol EXAM: PORTABLE ABDOMEN - 1 VIEW COMPARISON:  11/06/2021 plain film and CT FINDINGS: Small bowel dilatation decreased since prior CT. Gas and stool noted within the colon. Prior cholecystectomy. No organomegaly or free air. Unable to visualize NG tube on this study, but the upper abdomen/hemidiaphragms are not visualized on this single image. IMPRESSION: Improving small bowel distention since prior CT. Electronically Signed   By: Rolm Baptise M.D.   On: 11/06/2021 22:08   DG Abd Portable 1V-Small Bowel Protocol-Position Verification  Result Date: 11/06/2021 CLINICAL DATA:  NG tube placement. EXAM: PORTABLE ABDOMEN - 1 VIEW COMPARISON:  Earlier same day FINDINGS: 1223 hours. NG tube tip is in the mid to distal stomach with proximal side port well below the GE junction. Visualized lung bases are unremarkable. No gaseous bowel dilatation within the visualized upper abdomen. IMPRESSION: NG tube tip is in the mid to distal stomach. Electronically Signed   By: Misty Stanley M.D.   On: 11/06/2021 12:42   DG Abd Portable 1V  Result Date: 11/06/2021 CLINICAL DATA:  Nasogastric tube placement. EXAM: PORTABLE ABDOMEN - 1 VIEW COMPARISON:  None. FINDINGS: Nasogastric terminates proximally 3-4 cm beyond the gastroesophageal junction. Bowel gas pattern is unremarkable. Visualized lung bases are grossly clear.  IMPRESSION: Nasogastric terminates approximately 3-4 cm beyond the gastroesophageal junction. Advancing several cm showed better position the tip within the stomach. Electronically Signed   By: Lorin Picket M.D.   On: 11/06/2021 10:51    Anti-infectives: Anti-infectives (From admission, onward)    None        Assessment/Plan  SBO - CT w/ Distal Small Bowel Obstruction with transition in the RLQ where thick-walled and  inflamed loops then remain decompressed to the terminal ileum.  -On KUB this morning the side-port of the NG tube is near the GE junction, I have asked the nurse to advance it 10 cm.  I do not see any contrast on her KUB -likely because she threw up after administration of contrast.  We will repeat small bowel protocol today. -Continue NG tube to low intermittent wall suction and await further bowel function. - Mobilize - Hopefully patient will improve with nonoperative management. If patient fails to improve with nasogastric decompression and small bowel protocol management, they may require exploratory surgery during admission   FEN - NPO, IVF, NGT VTE - SCD's, Lovenox ID - None   Hx of Psoratic Arthritis - gets IV infusion every 4 weeks. Last infusion this past friday Patient is overdue for colonoscopy. Recommend follow up with her GI doctor in 6-8 weeks if this resolves with conservative management to have this scheduled   LOS: 0 days    Obie Dredge, [facility] Please see Amion for pager number during day hours 7:00am-4:30pm

## 2021-11-07 NOTE — Progress Notes (Signed)
Pt cont to complain of nausea, d/w PA and NG tube advanced approx 10 cm, placement verified X 3, tube immediately drained approx 100cc's brownish material. Remains to LWIS.

## 2021-11-07 NOTE — Progress Notes (Signed)
Transition of Care [facility]) Screening Note  Patient Details  Name: Christina Campos Date of Birth: Aug 08, 1951  [facility]) CM/SW Contact:    Sherie Don, LCSW Phone Number: 11/07/2021, 10:42 AM  Transition of Care Department [facility]) has reviewed patient and no TOC needs have been identified at this time. We will continue to monitor patient advancement through interdisciplinary progression rounds. If new patient transition needs arise, please place a TOC consult.

## 2021-11-08 NOTE — Progress Notes (Signed)
[facility] Progress Note     Subjective: CC:  Reports sight improvement - has had multiple liquid stools and a small amt flatus. Ongoing nausea and mild abdominal pain worse in epigastric region. Per patient, this happened last time. She reports some nausea and abdominal discomfort that continued even after her SBO resolved and she went home.   Objective: Vital signs in last 24 hours: Temp:  [97.9 F (36.6 C)-98.8 F (37.1 C)] 98.8 F (37.1 C) (12/14 0526) Pulse Rate:  [81-94] 81 (12/14 0526) Resp:  [15-18] 15 (12/14 0526) BP: (132-161)/(64-80) 141/80 (12/14 0526) SpO2:  [88 %-92 %] 92 % (12/14 0526) Last BM Date: 11/05/21  Intake/Output from previous day: 12/13 0701 - 12/14 0700 In: 2630.5 [P.O.:120; I.V.:2390.5] Out: 1075 [Urine:400; Emesis/NG output:675] Intake/Output this shift: Total I/O In: 480 [P.O.:480] Out: 350 [Emesis/NG output:350]  PE: Gen:  Alert, NAD, pleasant Card:  Regular rate and rhythm, pedal pulses 2+ BL Pulm:  Normal effort, clear to auscultation bilaterally Abd: Soft, mild distention, globally tender without rebound tenderness or peritonitis, +BS. NG with bilious fluid in cannister 675 cc/24h Skin: warm and dry, no rashes  Psych: A&Ox3   Lab Results:  Recent Labs    11/06/21 0649 11/07/21 0442  WBC 9.7 10.9*  HGB 14.6 12.6  HCT 44.6 38.4  PLT 233 170   BMET Recent Labs    11/06/21 0649 11/07/21 0442  NA 137 138  K 4.0 4.0  CL 104 105  CO2 24 28  GLUCOSE 134* 120*  BUN 19 15  CREATININE 0.93 0.77  CALCIUM 9.5 8.6*   PT/INR No results for input(s): LABPROT, INR in the last 72 hours. CMP     Component Value Date/Time   NA 138 11/07/2021 0442   K 4.0 11/07/2021 0442   CL 105 11/07/2021 0442   CO2 28 11/07/2021 0442   GLUCOSE 120 (H) 11/07/2021 0442   BUN 15 11/07/2021 0442   CREATININE 0.77 11/07/2021 0442   CALCIUM 8.6 (L) 11/07/2021 0442   PROT 7.5 11/06/2021 0649   ALBUMIN 4.4 11/06/2021 0649   AST 16  11/06/2021 0649   ALT 17 11/06/2021 0649   ALKPHOS 96 11/06/2021 0649   BILITOT 0.3 11/06/2021 0649   GFRNONAA >60 11/07/2021 0442   GFRAA >60 05/29/2019 0359   Lipase     Component Value Date/Time   LIPASE 34 05/28/2019 1100       Studies/Results: DG Abd Portable 1V-Small Bowel Obstruction Protocol-initial, 8 hr delay  Result Date: 11/07/2021 CLINICAL DATA:  8 hour delayed film. EXAM: PORTABLE ABDOMEN - 1 VIEW COMPARISON:  Earlier study today at 4:15 a.m. FINDINGS: Current exam at 11:36 p.m., 11/07/2021. NGT is been advanced into the stomach with the tip either in the antrum or the bulb of the duodenum. Contrast has cleared from the bladder in the interval. Contrast is now seen in the pelvic small bowel and throughout the normal caliber large intestine. No dilated small bowel is seen on the current exam. Precipitated barium is noted in the low central abdominal and right pelvic bowel. Visceral shadows are stable. In all other respects no further changes. IMPRESSION: No dilated small bowel is currently visible. There is contrast in normal caliber pelvic small bowel and in the normal caliber colon. NGT tip is either in the distal gastric antrum or bulb of the duodenum. Electronically Signed   By: Telford Nab M.D.   On: 11/07/2021 23:51   DG Abd Portable 1V  Result Date: 11/07/2021  CLINICAL DATA:  Abdominal pain EXAM: PORTABLE ABDOMEN - 1 VIEW COMPARISON:  11/06/2021 FINDINGS: Tip of enteric tube is seen in the medial aspect of the fundus of the stomach. Side-port in the enteric tube is noted in the lumen of lower thoracic esophagus. There are fluid-filled small bowel loops measuring up to 5.1 cm in diameter. Gas and stool are present in colon. There is no fecal impaction in the rectum. There is contrast in urinary bladder from previous CT. IMPRESSION: There are dilated fluid-filled small bowel loops suggesting possible partial small bowel obstruction or ileus. Gas and stool are noted in  the colon and rectum. Stomach is not distended. Side-port in the enteric tube is seen in the lower thoracic esophagus. Enteric tube should be advanced approximately 10 cm. These results will be called to the ordering clinician or representative by the Radiologist Assistant, and communication documented in the PACS or [employer]. Electronically Signed   By: Elmer Picker M.D.   On: 11/07/2021 08:07   DG Abd Portable 1V-Small Bowel Obstruction Protocol-initial, 8 hr delay  Result Date: 11/06/2021 CLINICAL DATA:  Small bowel protocol EXAM: PORTABLE ABDOMEN - 1 VIEW COMPARISON:  11/06/2021 plain film and CT FINDINGS: Small bowel dilatation decreased since prior CT. Gas and stool noted within the colon. Prior cholecystectomy. No organomegaly or free air. Unable to visualize NG tube on this study, but the upper abdomen/hemidiaphragms are not visualized on this single image. IMPRESSION: Improving small bowel distention since prior CT. Electronically Signed   By: Rolm Baptise M.D.   On: 11/06/2021 22:08   DG Abd Portable 1V-Small Bowel Protocol-Position Verification  Result Date: 11/06/2021 CLINICAL DATA:  NG tube placement. EXAM: PORTABLE ABDOMEN - 1 VIEW COMPARISON:  Earlier same day FINDINGS: 1223 hours. NG tube tip is in the mid to distal stomach with proximal side port well below the GE junction. Visualized lung bases are unremarkable. No gaseous bowel dilatation within the visualized upper abdomen. IMPRESSION: NG tube tip is in the mid to distal stomach. Electronically Signed   By: Misty Stanley M.D.   On: 11/06/2021 12:42   DG Abd Portable 1V  Result Date: 11/06/2021 CLINICAL DATA:  Nasogastric tube placement. EXAM: PORTABLE ABDOMEN - 1 VIEW COMPARISON:  None. FINDINGS: Nasogastric terminates proximally 3-4 cm beyond the gastroesophageal junction. Bowel gas pattern is unremarkable. Visualized lung bases are grossly clear. IMPRESSION: Nasogastric terminates approximately 3-4 cm beyond the  gastroesophageal junction. Advancing several cm showed better position the tip within the stomach. Electronically Signed   By: Lorin Picket M.D.   On: 11/06/2021 10:51    Anti-infectives: Anti-infectives (From admission, onward)    None        Assessment/Plan  SBO - CT w/ Distal Small Bowel Obstruction with transition in the RLQ where thick-walled and inflamed loops then remain decompressed to the terminal ileum.  -small bowel protocol 12/13 >> contrast in the colon and no signs of obstruction on x-ray - having bowel function but also with thick NG output and persistent nausea. NG clamp trial today. Possible D/C of NG tube this afternoon. - Mobilize - Hopefully patient will improve with nonoperative management. If patient fails to improve with nasogastric decompression and small bowel protocol management, they may require exploratory surgery during admission   FEN - NPO, IVF, NGT clamped. VTE - SCD's, Lovenox ID - None   Hx of Psoratic Arthritis - gets IV infusion every 4 weeks. Last infusion this past friday Patient is overdue for colonoscopy. Recommend follow up  with her GI doctor in 6-8 weeks if this resolves with conservative management to have this scheduled   LOS: 1 day    Christina Campos, [facility] Please see Amion for pager number during day hours 7:00am-4:30pm

## 2021-11-08 NOTE — Progress Notes (Signed)
°   11/08/21 1300  Mobility  Activity Ambulated in hall  Level of Assistance Modified independent, requires aide device or extra time  Assistive Device Other (Comment) (IV pol;e)  Distance Ambulated (ft) 400 ft  Mobility Ambulated with assistance in hallway  Mobility Response Tolerated well  Mobility performed by Mobility specialist  $Mobility charge 1 Mobility   Pt agreeable to mobilize this afternoon. She stated she was experiencing some discomfort in her upper abdomen prior to session, but believes this is secondary to her NG tube. NG tube clamped. Ambulated about 44ft in hall with IV pole, tolerated well. No complaints. Left pt in bed with call bell at side and family present. RN notified of session.  [place] Specialist Acute Rehab Services Office: [phone]

## 2021-11-09 MED ORDER — ALUM & MAG HYDROXIDE-SIMETH 200-200-20 MG/5ML PO SUSP
30.0000 mL | Freq: Four times a day (QID) | ORAL | Status: DC | PRN
  Administered 2021-11-09: 30 mL via ORAL
  Filled 2021-11-09 (×2): qty 30

## 2021-11-09 MED ORDER — SIMETHICONE 80 MG PO CHEW
80.0000 mg | CHEWABLE_TABLET | Freq: Four times a day (QID) | ORAL | Status: DC | PRN
  Administered 2021-11-09 – 2021-11-10 (×3): 80 mg via ORAL
  Filled 2021-11-09 (×3): qty 1

## 2021-11-09 NOTE — Progress Notes (Signed)
°   11/09/21 1451  Mobility  Activity Ambulated in hall  Level of Assistance Modified independent, requires aide device or extra time  Assistive Device Front wheel walker  Distance Ambulated (ft) 500 ft  Mobility Ambulated with assistance in hallway  Mobility Response Tolerated well  Mobility performed by Mobility specialist  $Mobility charge 1 Mobility   Pt agreeable to mobilize this afternoon. Ambulated in hall about 539ft in hall, tolerated well. Left pt in room with call bell at side and family present.   [place] Specialist Acute Rehab Services Office: [phone]

## 2021-11-09 NOTE — Progress Notes (Signed)
[facility] Progress Note     Subjective: CC:  Reports sight improvement - has had multiple liquid stools and a small amt flatus. Ongoing nausea and mild abdominal pain worse in epigastric region. Per patient, this happened last time. She reports some nausea and abdominal discomfort that continued even after her SBO resolved and she went home.   Objective: Vital signs in last 24 hours: Temp:  [97.9 F (36.6 C)-98.1 F (36.7 C)] 97.9 F (36.6 C) (12/15 0645) Pulse Rate:  [73-82] 73 (12/15 0645) Resp:  [18] 18 (12/15 0645) BP: (144-149)/(74-78) 149/74 (12/15 0645) SpO2:  [91 %-92 %] 92 % (12/15 0645) Last BM Date: 11/08/21  Intake/Output from previous day: 12/14 0701 - 12/15 0700 In: 3734.3 [P.O.:1370; I.V.:2364.3] Out: 1350 [Urine:600; Emesis/NG output:350; Stool:400] Intake/Output this shift: Total I/O In: 297.1 [I.V.:297.1] Out: 500 [Urine:500]  PE: Gen:  Alert, NAD, pleasant Card:  Regular rate and rhythm, pedal pulses 2+ BL Pulm:  Normal effort, clear to auscultation bilaterally Abd: Soft, mild distention, globally tender without rebound tenderness or peritonitis, +BS. NG with bilious fluid in cannister 675 cc/24h Skin: warm and dry, no rashes  Psych: A&Ox3   Lab Results:  Recent Labs    11/07/21 0442  WBC 10.9*  HGB 12.6  HCT 38.4  PLT 170   BMET Recent Labs    11/07/21 0442  NA 138  K 4.0  CL 105  CO2 28  GLUCOSE 120*  BUN 15  CREATININE 0.77  CALCIUM 8.6*   PT/INR No results for input(s): LABPROT, INR in the last 72 hours. CMP     Component Value Date/Time   NA 138 11/07/2021 0442   K 4.0 11/07/2021 0442   CL 105 11/07/2021 0442   CO2 28 11/07/2021 0442   GLUCOSE 120 (H) 11/07/2021 0442   BUN 15 11/07/2021 0442   CREATININE 0.77 11/07/2021 0442   CALCIUM 8.6 (L) 11/07/2021 0442   PROT 7.5 11/06/2021 0649   ALBUMIN 4.4 11/06/2021 0649   AST 16 11/06/2021 0649   ALT 17 11/06/2021 0649   ALKPHOS 96 11/06/2021 0649   BILITOT  0.3 11/06/2021 0649   GFRNONAA >60 11/07/2021 0442   GFRAA >60 05/29/2019 0359   Lipase     Component Value Date/Time   LIPASE 34 05/28/2019 1100       Studies/Results: DG Abd Portable 1V-Small Bowel Obstruction Protocol-initial, 8 hr delay  Result Date: 11/07/2021 CLINICAL DATA:  8 hour delayed film. EXAM: PORTABLE ABDOMEN - 1 VIEW COMPARISON:  Earlier study today at 4:15 a.m. FINDINGS: Current exam at 11:36 p.m., 11/07/2021. NGT is been advanced into the stomach with the tip either in the antrum or the bulb of the duodenum. Contrast has cleared from the bladder in the interval. Contrast is now seen in the pelvic small bowel and throughout the normal caliber large intestine. No dilated small bowel is seen on the current exam. Precipitated barium is noted in the low central abdominal and right pelvic bowel. Visceral shadows are stable. In all other respects no further changes. IMPRESSION: No dilated small bowel is currently visible. There is contrast in normal caliber pelvic small bowel and in the normal caliber colon. NGT tip is either in the distal gastric antrum or bulb of the duodenum. Electronically Signed   By: Telford Nab M.D.   On: 11/07/2021 23:51    Anti-infectives: Anti-infectives (From admission, onward)    None        Assessment/Plan  SBO - CT w/ Distal  Small Bowel Obstruction with transition in the RLQ where thick-walled and inflamed loops then remain decompressed to the terminal ileum.  -small bowel protocol 12/13 >> contrast in the colon and no signs of obstruction on x-ray - having flatus and stool, pain and nausea improving - advance to SOFT diet. Possible PM discharge pending tolerating of diet without recurrence of sxs. - Mobilize - Hopefully patient will improve with nonoperative management. If patient fails to improve with nasogastric decompression and small bowel protocol management, they may require exploratory surgery during admission   FEN - SOFT,  saline lock IV VTE - SCD's, Lovenox ID - None   Hx of Psoratic Arthritis - gets IV infusion every 4 weeks. Last infusion this past friday Patient is overdue for colonoscopy. Recommend follow up with her GI doctor in 6-8 weeks if this resolves with conservative management to have this scheduled   LOS: 2 days    Obie Dredge, [facility] Please see Amion for pager number during day hours 7:00am-4:30pm

## 2021-11-10 MED ORDER — ACETAMINOPHEN 325 MG PO TABS: 650.0000 mg | ORAL_TABLET | Freq: Four times a day (QID) | ORAL | Status: AC | PRN

## 2021-11-10 MED ORDER — ONDANSETRON 4 MG PO TBDP: 4.0000 mg | ORAL_TABLET | Freq: Three times a day (TID) | ORAL | 1 refills | Status: DC | PRN

## 2021-11-10 NOTE — Discharge Summary (Signed)
[facility] Discharge Summary   Patient ID: Christina Campos MRN: [id number] DOB/AGE: June 09, 1951 70 y.o.  Admit date: 11/06/2021 Discharge date: 11/10/2021   Discharge Diagnosis Patient Active Problem List   Diagnosis Date Noted   SBO (small bowel obstruction) ([place]) 09/01/2014   Asthma, chronic 09/01/2014   Leukocytosis 09/01/2014   Hyperglycemia 09/01/2014   PVC (premature ventricular contraction) 09/02/2013   Chest tightness 09/02/2013   HTN (hypertension) 09/02/2013   Hypercholesterolemia 09/02/2013    Imaging: No results found.  Procedures None  Hospital Course:  70 y/o F with PMH cholecystectomy and abd hysterectomy who presented to the ED with a cc abd pain, nausea, vomiting. Workup revealed small bowel obstruction. Patient was admitted, NG tube placed, and put on small bowel protocol with gastrografin. Her bowel function returned. NG tube was removed and diet advanced as tolerated. On 11/10/21 vitals were stable, pain controlled, tolerating PO, and felt stable for discharge.   Physical Exam: General:  Alert, NAD, pleasant, comfortable Abd:  Soft, ND, mild tenderness, improved compared to prior, +BS  Allergies as of 11/10/2021       Reactions   Codeine Shortness Of Breath, Swelling   Latex Itching   Lipitor [atorvastatin] Other (See Comments)   confusion   Lisinopril Cough   Onion Nausea And Vomiting   Raw Acute pain in gastric area   Wellbutrin [bupropion] Other (See Comments)   Worsened anxiety   Zocor [simvastatin] Other (See Comments)   Confusion/ sleepy   Zoloft [sertraline] Other (See Comments)   Worsened anxiety        Medication List     TAKE these medications    acetaminophen 325 MG tablet Commonly known as: TYLENOL Take 2-3 tablets (650-975 mg total) by mouth every 6 (six) hours as needed for mild pain (or temp > 100).   ibuprofen 200 MG tablet Commonly known as: ADVIL Take 400 mg by mouth 2 (two) times daily. For  arthritic pain   ondansetron 4 MG disintegrating tablet Commonly known as: ZOFRAN-ODT Take 1 tablet (4 mg total) by mouth every 8 (eight) hours as needed for nausea.   SIMPONI [place] Inject into the skin every 2 (two) months. Administered by Dr Lahoma Rocker, [place] - for psoriatic arthritis           Signed: Obie Dredge, [facility] 11/10/2021, 10:06 AM

## 2021-11-10 NOTE — Progress Notes (Signed)
Nurse reviewed discharge instructions with pt.  Pt verbalized understanding of discharge instructions, follow up appointments and new medications.  No concerns at time of discharge. 

## 2021-11-13 DIAGNOSIS — F419 Anxiety disorder, unspecified: Secondary | ICD-10-CM | POA: Diagnosis not present

## 2021-11-20 DIAGNOSIS — F419 Anxiety disorder, unspecified: Secondary | ICD-10-CM | POA: Diagnosis not present

## 2021-11-24 DIAGNOSIS — R7982 Elevated C-reactive protein (CRP): Secondary | ICD-10-CM | POA: Diagnosis not present

## 2021-11-24 DIAGNOSIS — K56609 Unspecified intestinal obstruction, unspecified as to partial versus complete obstruction: Secondary | ICD-10-CM | POA: Diagnosis not present

## 2021-11-24 DIAGNOSIS — M2550 Pain in unspecified joint: Secondary | ICD-10-CM | POA: Diagnosis not present

## 2021-11-24 DIAGNOSIS — M1990 Unspecified osteoarthritis, unspecified site: Secondary | ICD-10-CM | POA: Diagnosis not present

## 2021-11-24 DIAGNOSIS — Z79899 Other long term (current) drug therapy: Secondary | ICD-10-CM | POA: Diagnosis not present

## 2021-11-24 DIAGNOSIS — L609 Nail disorder, unspecified: Secondary | ICD-10-CM | POA: Diagnosis not present

## 2021-11-24 DIAGNOSIS — L409 Psoriasis, unspecified: Secondary | ICD-10-CM | POA: Diagnosis not present

## 2021-11-24 DIAGNOSIS — M79643 Pain in unspecified hand: Secondary | ICD-10-CM | POA: Diagnosis not present

## 2021-11-24 DIAGNOSIS — L4050 Arthropathic psoriasis, unspecified: Secondary | ICD-10-CM | POA: Diagnosis not present

## 2021-11-27 DIAGNOSIS — F419 Anxiety disorder, unspecified: Secondary | ICD-10-CM | POA: Diagnosis not present

## 2021-12-11 DIAGNOSIS — F419 Anxiety disorder, unspecified: Secondary | ICD-10-CM | POA: Diagnosis not present

## 2021-12-19 DIAGNOSIS — F419 Anxiety disorder, unspecified: Secondary | ICD-10-CM | POA: Diagnosis not present

## 2022-01-01 DIAGNOSIS — F419 Anxiety disorder, unspecified: Secondary | ICD-10-CM | POA: Diagnosis not present

## 2022-01-05 DIAGNOSIS — L4050 Arthropathic psoriasis, unspecified: Secondary | ICD-10-CM | POA: Diagnosis not present

## 2022-01-05 DIAGNOSIS — Z1159 Encounter for screening for other viral diseases: Secondary | ICD-10-CM | POA: Diagnosis not present

## 2022-01-15 DIAGNOSIS — F419 Anxiety disorder, unspecified: Secondary | ICD-10-CM | POA: Diagnosis not present

## 2022-01-23 DIAGNOSIS — F419 Anxiety disorder, unspecified: Secondary | ICD-10-CM | POA: Diagnosis not present

## 2022-01-29 DIAGNOSIS — F419 Anxiety disorder, unspecified: Secondary | ICD-10-CM | POA: Diagnosis not present

## 2022-01-31 DIAGNOSIS — F419 Anxiety disorder, unspecified: Secondary | ICD-10-CM | POA: Diagnosis not present

## 2022-02-05 DIAGNOSIS — F419 Anxiety disorder, unspecified: Secondary | ICD-10-CM | POA: Diagnosis not present

## 2022-02-12 DIAGNOSIS — F419 Anxiety disorder, unspecified: Secondary | ICD-10-CM | POA: Diagnosis not present

## 2022-02-14 DIAGNOSIS — Z20822 Contact with and (suspected) exposure to covid-19: Secondary | ICD-10-CM | POA: Diagnosis not present

## 2022-02-19 DIAGNOSIS — F419 Anxiety disorder, unspecified: Secondary | ICD-10-CM | POA: Diagnosis not present

## 2022-02-23 DIAGNOSIS — M2550 Pain in unspecified joint: Secondary | ICD-10-CM | POA: Diagnosis not present

## 2022-02-23 DIAGNOSIS — Z79899 Other long term (current) drug therapy: Secondary | ICD-10-CM | POA: Diagnosis not present

## 2022-02-23 DIAGNOSIS — M1990 Unspecified osteoarthritis, unspecified site: Secondary | ICD-10-CM | POA: Diagnosis not present

## 2022-02-23 DIAGNOSIS — L609 Nail disorder, unspecified: Secondary | ICD-10-CM | POA: Diagnosis not present

## 2022-02-23 DIAGNOSIS — R7982 Elevated C-reactive protein (CRP): Secondary | ICD-10-CM | POA: Diagnosis not present

## 2022-02-23 DIAGNOSIS — M79643 Pain in unspecified hand: Secondary | ICD-10-CM | POA: Diagnosis not present

## 2022-02-23 DIAGNOSIS — L4050 Arthropathic psoriasis, unspecified: Secondary | ICD-10-CM | POA: Diagnosis not present

## 2022-02-23 DIAGNOSIS — L659 Nonscarring hair loss, unspecified: Secondary | ICD-10-CM | POA: Diagnosis not present

## 2022-02-23 DIAGNOSIS — L409 Psoriasis, unspecified: Secondary | ICD-10-CM | POA: Diagnosis not present

## 2022-03-02 DIAGNOSIS — Z20822 Contact with and (suspected) exposure to covid-19: Secondary | ICD-10-CM | POA: Diagnosis not present

## 2022-03-06 DIAGNOSIS — F419 Anxiety disorder, unspecified: Secondary | ICD-10-CM | POA: Diagnosis not present

## 2022-03-09 DIAGNOSIS — L4050 Arthropathic psoriasis, unspecified: Secondary | ICD-10-CM | POA: Diagnosis not present

## 2022-03-19 DIAGNOSIS — F419 Anxiety disorder, unspecified: Secondary | ICD-10-CM | POA: Diagnosis not present

## 2022-04-02 DIAGNOSIS — Z20822 Contact with and (suspected) exposure to covid-19: Secondary | ICD-10-CM | POA: Diagnosis not present

## 2022-04-02 DIAGNOSIS — F419 Anxiety disorder, unspecified: Secondary | ICD-10-CM | POA: Diagnosis not present

## 2022-04-16 DIAGNOSIS — F419 Anxiety disorder, unspecified: Secondary | ICD-10-CM | POA: Diagnosis not present

## 2022-04-30 DIAGNOSIS — F419 Anxiety disorder, unspecified: Secondary | ICD-10-CM | POA: Diagnosis not present

## 2022-05-03 DIAGNOSIS — Z0000 Encounter for general adult medical examination without abnormal findings: Secondary | ICD-10-CM | POA: Diagnosis not present

## 2022-05-03 DIAGNOSIS — E7800 Pure hypercholesterolemia, unspecified: Secondary | ICD-10-CM | POA: Diagnosis not present

## 2022-05-03 DIAGNOSIS — E2839 Other primary ovarian failure: Secondary | ICD-10-CM | POA: Diagnosis not present

## 2022-05-03 DIAGNOSIS — Z1331 Encounter for screening for depression: Secondary | ICD-10-CM | POA: Diagnosis not present

## 2022-05-03 DIAGNOSIS — M542 Cervicalgia: Secondary | ICD-10-CM | POA: Diagnosis not present

## 2022-05-03 DIAGNOSIS — Z1211 Encounter for screening for malignant neoplasm of colon: Secondary | ICD-10-CM | POA: Diagnosis not present

## 2022-05-03 DIAGNOSIS — L4050 Arthropathic psoriasis, unspecified: Secondary | ICD-10-CM | POA: Diagnosis not present

## 2022-05-04 DIAGNOSIS — L4050 Arthropathic psoriasis, unspecified: Secondary | ICD-10-CM | POA: Diagnosis not present

## 2022-05-11 ENCOUNTER — Other Ambulatory Visit: Payer: Self-pay | Admitting: Internal Medicine

## 2022-05-11 DIAGNOSIS — E2839 Other primary ovarian failure: Secondary | ICD-10-CM

## 2022-05-14 DIAGNOSIS — F419 Anxiety disorder, unspecified: Secondary | ICD-10-CM | POA: Diagnosis not present

## 2022-05-15 DIAGNOSIS — F419 Anxiety disorder, unspecified: Secondary | ICD-10-CM | POA: Diagnosis not present

## 2022-05-21 DIAGNOSIS — F419 Anxiety disorder, unspecified: Secondary | ICD-10-CM | POA: Diagnosis not present

## 2022-05-28 ENCOUNTER — Ambulatory Visit (INDEPENDENT_AMBULATORY_CARE_PROVIDER_SITE_OTHER): Payer: Medicare Other | Admitting: Plastic Surgery

## 2022-05-28 VITALS — BP 146/83 | HR 70 | Ht 66.0 in | Wt 179.0 lb

## 2022-05-28 DIAGNOSIS — Z780 Asymptomatic menopausal state: Secondary | ICD-10-CM | POA: Diagnosis not present

## 2022-05-28 DIAGNOSIS — D485 Neoplasm of uncertain behavior of skin: Secondary | ICD-10-CM | POA: Diagnosis not present

## 2022-05-28 DIAGNOSIS — M85851 Other specified disorders of bone density and structure, right thigh: Secondary | ICD-10-CM | POA: Diagnosis not present

## 2022-05-28 DIAGNOSIS — M85852 Other specified disorders of bone density and structure, left thigh: Secondary | ICD-10-CM | POA: Diagnosis not present

## 2022-05-28 DIAGNOSIS — F419 Anxiety disorder, unspecified: Secondary | ICD-10-CM | POA: Diagnosis not present

## 2022-05-28 DIAGNOSIS — D489 Neoplasm of uncertain behavior, unspecified: Secondary | ICD-10-CM

## 2022-05-28 DIAGNOSIS — Z1231 Encounter for screening mammogram for malignant neoplasm of breast: Secondary | ICD-10-CM | POA: Diagnosis not present

## 2022-05-28 NOTE — Progress Notes (Signed)
Referring Provider Lavone Orn, MD [place] [address],  [place] [address]   CC:  Scar on nose, red lesion right side of face, left side of face scaling lesion  Christina Campos is an 71 y.o. female.  HPI: The patient is a 71 year old with a scar on her nose that she is concerned about.  She also has a scaling lesion on the left side of her face.  She has a red lesion on the right side of her face near the site of previous excision.    Allergies  Allergen Reactions   Codeine Shortness Of Breath and Swelling   Latex Itching   Lipitor [Atorvastatin] Other (See Comments)    confusion   Lisinopril Cough   Onion Nausea And Vomiting    Raw Acute pain in gastric area   Wellbutrin [Bupropion] Other (See Comments)    Worsened anxiety   Zocor [Simvastatin] Other (See Comments)    Confusion/ sleepy   Zoloft [Sertraline] Other (See Comments)    Worsened anxiety    Outpatient Encounter Medications as of 05/28/2022  Medication Sig Note   acetaminophen (TYLENOL) 325 MG tablet Take 2-3 tablets (650-975 mg total) by mouth every 6 (six) hours as needed for mild pain (or temp > 100).    Golimumab (SIMPONI [place]) Inject into the skin every 2 (two) months. Administered by Dr Lahoma Rocker, [place] - for psoriatic arthritis 11/06/2021: Dose unknown to patient and not showing on MD web site   ibuprofen (ADVIL,MOTRIN) 200 MG tablet Take 400 mg by mouth 2 (two) times daily. For arthritic pain    leflunomide (ARAVA) 20 MG tablet Take 20 mg by mouth daily.    ondansetron (ZOFRAN-ODT) 4 MG disintegrating tablet Take 1 tablet (4 mg total) by mouth every 8 (eight) hours as needed for nausea.    No facility-administered encounter medications on file as of 05/28/2022.     Past Medical History:  Diagnosis Date   Anxiety    Bladder cancer [facility]) urologist-  dr Alyson Ingles   dx 06/ 2016   History of diverticulitis of colon    Aug 2011--  resolved no surgical intervention   History of  exercise stress test    Abnormal stress ehco  09-23-2013---  negative ischemia w/  no ST changes,  after exercise there was hypokinesis in the mid/distal lateral wall and apex consistent with ischemia Positive stress echo   History of gestational diabetes    History of hypertension    PT CHANGED STRESSFUL JOB AND HYPERTENSION RESOLVED , NO MEDICATION SINCE 07/ 2018 APPROX.   History of small bowel obstruction    09-01-2014--  per CT adhesions--  resolved without surgical intervention   Hyperlipidemia    OA (osteoarthritis)    Palpitations    PVC (premature ventricular contraction)    Seasonal allergic rhinitis    Wears glasses     Past Surgical History:  Procedure Laterality Date   BREAST BIOPSY     CHOLECYSTECTOMY N/A 10/16/2013   Procedure: LAPAROSCOPIC CHOLECYSTECTOMY WITH INTRAOPERATIVE CHOLANGIOGRAM;  Surgeon: Rolm Bookbinder, MD;  Location: [place];  Service: General;  Laterality: N/A;   COLONOSCOPY  11-09-2010   CYSTOSCOPY W/ RETROGRADES Bilateral 05/18/2015   Procedure: CYSTOSCOPY WITH  BILATERAL RETROGRADE PYELOGRAM;  Surgeon: Cleon Gustin, MD;  Location: [facility];  Service: Urology;  Laterality: Bilateral;   CYSTOSCOPY W/ RETROGRADES Bilateral 11/22/2017   Procedure: CYSTOSCOPY WITH RETROGRADE PYELOGRAM;  Surgeon: Cleon Gustin, MD;  Location: [place];  Service: Urology;  Laterality: Bilateral;   LEFT HEART CATHETERIZATION WITH CORONARY ANGIOGRAM N/A 10/07/2013   Procedure: LEFT HEART CATHETERIZATION WITH CORONARY ANGIOGRAM;  Surgeon: Peter M Martinique, MD;  Location: [facility] CATH LAB;  Service: Cardiovascular;  Laterality: N/A;  Normal coronary arteries;  Normal LV function, ef 55-65%   TEMPOROMANDIBULAR JOINT SURGERY  1987   TOTAL ABDOMINAL HYSTERECTOMY  06-17-2003   w/ Right Ovarian Cystectomy and McCall Culdoplasty   TRANSURETHRAL RESECTION OF BLADDER TUMOR N/A 11/22/2017   Procedure: TRANSURETHRAL RESECTION OF BLADDER TUMOR  (TURBT);  Surgeon: Cleon Gustin, MD;  Location: [facility];  Service: Urology;  Laterality: N/A;   TRANSURETHRAL RESECTION OF BLADDER TUMOR WITH GYRUS (TURBT-GYRUS) N/A 05/18/2015   Procedure: TRANSURETHRAL RESECTION OF BLADDER TUMOR WITH GYRUS (TURBT-GYRUS);  Surgeon: Cleon Gustin, MD;  Location: [facility];  Service: Urology;  Laterality: N/A;   URETEROSCOPY Bilateral 05/18/2015   Procedure:  ATTEMPTED URETEROSCOPY LEFT;  Surgeon: Cleon Gustin, MD;  Location: [facility];  Service: Urology;  Laterality: Bilateral;    Family History  Problem Relation Age of Onset   Arrhythmia Brother    Arrhythmia Brother    Heart disease Father    Hypertension Father    Heart attack Father    Diabetes Mother    Diabetes Maternal Grandfather    Diabetes Paternal Grandmother    Breast cancer Maternal Aunt     Social History   Social History Narrative   Not on file     Review of Systems General: Denies fevers, chills, weight loss CV: Denies chest pain, shortness of breath, palpitations   Physical Exam    05/28/2022   10:41 AM 11/10/2021    6:14 AM 11/09/2021    9:33 PM  Vitals with BMI  Height '5\' 6"'$     Weight 179 lbs    BMI 76.72    Systolic [phone]  Diastolic 83 83 77  Pulse 70 78 78    General:  No acute distress,  Alert and oriented, Non-Toxic, Normal speech and affect HEENT: Subtle scar of the central nose.  Scaling cystic lesion 6 mm left side of face on the angle of the mandible, red lesion on the right side of the face.  Assessment/Plan 1) recommend excision of 6 mm lesion on the left side of the face.  This may be a benign keratosis but is difficult to tell.  We will do this first. 2) red lesion on the right side of the face may be amenable to IPL treatment or other like treatment. 3) nasal scar is subtle and I do not recommend scar revision but I think that halo laser may further blood in the scar and she  could have a full face treatment which would be very beneficial to her.  The PA [employer] also examined the patient with me and agreed that he would perform a halo laser treatment if she is interested.    Lennice Sites 05/28/2022, 12:56 PM

## 2022-06-01 DIAGNOSIS — Z6828 Body mass index (BMI) 28.0-28.9, adult: Secondary | ICD-10-CM | POA: Diagnosis not present

## 2022-06-01 DIAGNOSIS — N951 Menopausal and female climacteric states: Secondary | ICD-10-CM | POA: Diagnosis not present

## 2022-06-01 DIAGNOSIS — E782 Mixed hyperlipidemia: Secondary | ICD-10-CM | POA: Diagnosis not present

## 2022-06-06 ENCOUNTER — Encounter: Payer: Self-pay | Admitting: *Deleted

## 2022-06-08 DIAGNOSIS — R635 Abnormal weight gain: Secondary | ICD-10-CM | POA: Diagnosis not present

## 2022-06-08 DIAGNOSIS — Z6828 Body mass index (BMI) 28.0-28.9, adult: Secondary | ICD-10-CM | POA: Diagnosis not present

## 2022-06-08 DIAGNOSIS — N898 Other specified noninflammatory disorders of vagina: Secondary | ICD-10-CM | POA: Diagnosis not present

## 2022-06-08 DIAGNOSIS — Z1339 Encounter for screening examination for other mental health and behavioral disorders: Secondary | ICD-10-CM | POA: Diagnosis not present

## 2022-06-08 DIAGNOSIS — R232 Flushing: Secondary | ICD-10-CM | POA: Diagnosis not present

## 2022-06-08 DIAGNOSIS — Z1331 Encounter for screening for depression: Secondary | ICD-10-CM | POA: Diagnosis not present

## 2022-06-08 DIAGNOSIS — N951 Menopausal and female climacteric states: Secondary | ICD-10-CM | POA: Diagnosis not present

## 2022-06-11 DIAGNOSIS — F419 Anxiety disorder, unspecified: Secondary | ICD-10-CM | POA: Diagnosis not present

## 2022-06-14 ENCOUNTER — Telehealth: Payer: Self-pay | Admitting: *Deleted

## 2022-06-14 NOTE — Telephone Encounter (Addendum)
Call placed to pt to follow up on quote for laser treatment. Mychart message has not been read as of today. NA/LVM. Also copy of quote mailed to patient.

## 2022-06-18 DIAGNOSIS — F419 Anxiety disorder, unspecified: Secondary | ICD-10-CM | POA: Diagnosis not present

## 2022-06-20 DIAGNOSIS — L659 Nonscarring hair loss, unspecified: Secondary | ICD-10-CM | POA: Diagnosis not present

## 2022-06-20 DIAGNOSIS — M1990 Unspecified osteoarthritis, unspecified site: Secondary | ICD-10-CM | POA: Diagnosis not present

## 2022-06-20 DIAGNOSIS — M2550 Pain in unspecified joint: Secondary | ICD-10-CM | POA: Diagnosis not present

## 2022-06-20 DIAGNOSIS — M79643 Pain in unspecified hand: Secondary | ICD-10-CM | POA: Diagnosis not present

## 2022-06-20 DIAGNOSIS — R7982 Elevated C-reactive protein (CRP): Secondary | ICD-10-CM | POA: Diagnosis not present

## 2022-06-20 DIAGNOSIS — Z79899 Other long term (current) drug therapy: Secondary | ICD-10-CM | POA: Diagnosis not present

## 2022-06-20 DIAGNOSIS — L4050 Arthropathic psoriasis, unspecified: Secondary | ICD-10-CM | POA: Diagnosis not present

## 2022-06-20 DIAGNOSIS — L409 Psoriasis, unspecified: Secondary | ICD-10-CM | POA: Diagnosis not present

## 2022-06-20 DIAGNOSIS — L609 Nail disorder, unspecified: Secondary | ICD-10-CM | POA: Diagnosis not present

## 2022-06-22 ENCOUNTER — Ambulatory Visit: Payer: Medicare Other | Admitting: Plastic Surgery

## 2022-06-22 DIAGNOSIS — D485 Neoplasm of uncertain behavior of skin: Secondary | ICD-10-CM | POA: Diagnosis not present

## 2022-06-22 DIAGNOSIS — C672 Malignant neoplasm of lateral wall of bladder: Secondary | ICD-10-CM | POA: Diagnosis not present

## 2022-06-22 DIAGNOSIS — D489 Neoplasm of uncertain behavior, unspecified: Secondary | ICD-10-CM

## 2022-06-22 DIAGNOSIS — L820 Inflamed seborrheic keratosis: Secondary | ICD-10-CM | POA: Diagnosis not present

## 2022-06-22 HISTORY — PX: OTHER SURGICAL HISTORY: SHX169

## 2022-06-22 NOTE — Progress Notes (Signed)
Operative Note   DATE OF OPERATION: 06/22/2022  LOCATION:    SURGICAL DEPARTMENT: Plastic Surgery  PREOPERATIVE DIAGNOSES:  left cheek lesion, angle of mandible  POSTOPERATIVE DIAGNOSES:  same  PROCEDURE:  Excision of left cheek lesion measuring 0.5 cm with simple closure   SURGEON: Tamiah Dysart P. Alexica Schlossberg, MD  ANESTHESIA:  Local  COMPLICATIONS: None.   INDICATIONS FOR PROCEDURE:  The patient, Christina Campos is a 71 y.o. female born on December 05, 1950, is here for treatment of left cheek lesion. MRN: [id number]  CONSENT:  Informed consent was obtained directly from the patient. Risks, benefits and alternatives were fully discussed. Specific risks including but not limited to bleeding, infection, hematoma, seroma, scarring, pain, infection, wound healing problems, and need for further surgery were all discussed. The patient did have an ample opportunity to have questions answered to satisfaction.   DESCRIPTION OF PROCEDURE:  Local anesthesia was administered. The patient's operative site was prepped and draped in a sterile fashion. A time out was performed and all information was confirmed to be correct.  The lesion was excised with a 5 mm punch biopsy blade.  Hemostasis was obtained.  The wound was closed with fast gut sutures in a single layer.  The patient tolerated the procedure well.  There were no complications.

## 2022-06-25 ENCOUNTER — Other Ambulatory Visit: Payer: Self-pay | Admitting: Urology

## 2022-06-25 DIAGNOSIS — F419 Anxiety disorder, unspecified: Secondary | ICD-10-CM | POA: Diagnosis not present

## 2022-06-29 ENCOUNTER — Ambulatory Visit (INDEPENDENT_AMBULATORY_CARE_PROVIDER_SITE_OTHER): Payer: Medicare Other | Admitting: Plastic Surgery

## 2022-06-29 DIAGNOSIS — L821 Other seborrheic keratosis: Secondary | ICD-10-CM

## 2022-06-29 NOTE — Progress Notes (Signed)
S/p excision left face lesion, no complaints  PE Incision c/d/I  Pathology:  SK, benign  A/P F/u PRN

## 2022-07-02 DIAGNOSIS — F419 Anxiety disorder, unspecified: Secondary | ICD-10-CM | POA: Diagnosis not present

## 2022-07-04 ENCOUNTER — Encounter (HOSPITAL_BASED_OUTPATIENT_CLINIC_OR_DEPARTMENT_OTHER): Payer: Self-pay | Admitting: Urology

## 2022-07-06 ENCOUNTER — Encounter (HOSPITAL_BASED_OUTPATIENT_CLINIC_OR_DEPARTMENT_OTHER): Payer: Self-pay | Admitting: Urology

## 2022-07-06 ENCOUNTER — Other Ambulatory Visit: Payer: Self-pay

## 2022-07-06 NOTE — Progress Notes (Signed)
Spoke w/ via phone for pre-op interview---pt Lab needs dos----  none             Lab results------ COVID test -----patient states asymptomatic no test needed Arrive at -------1300 pm 07-10-2022 NPO after MN NO Solid Food.  Clear liquids from MN until---1200 pm Med rec completed Medications to take morning of surgery -----none Diabetic medication -----n/a Patient instructed no nail polish to be worn day of surgery Patient instructed to bring photo id and insurance card day of surgery Patient aware to have Driver (ride )  friend Christina Campos/ caregiver    daughter Christina Campos for 24 hours after surgery  Patient Special Instructions -----none Pre-Op special Istructions -----none Patient verbalized understanding of instructions that were given at this phone interview. Patient denies shortness of breath, chest pain, fever, cough at this phone interview.    Normal Cardiac cath 10-07-2013 results on chart/epic Stress echo 09-23-2013 epic [facility] lori gerhardt 10-20-2013 epic

## 2022-07-09 NOTE — Anesthesia Preprocedure Evaluation (Addendum)
Anesthesia Evaluation  Patient identified by MRN, date of birth, ID band Patient awake    Reviewed: Allergy & Precautions, NPO status , Patient's Chart, lab work & pertinent test results  Airway Mallampati: II  TM Distance: >3 FB Neck ROM: Full    Dental no notable dental hx. (+) Teeth Intact, Dental Advisory Given   Pulmonary asthma , former smoker,    Pulmonary exam normal breath sounds clear to auscultation       Cardiovascular hypertension, Normal cardiovascular exam Rhythm:Regular Rate:Normal     Neuro/Psych Anxiety    GI/Hepatic Neg liver ROS,   Endo/Other  negative endocrine ROS  Renal/GU    Bladder CA    Musculoskeletal  (+) Arthritis ,   Abdominal   Peds  Hematology   Anesthesia Other Findings All: codeine, latex, lisiopril, lipitor, wellbutrin, zoloft, zocor  Reproductive/Obstetrics                            Anesthesia Physical Anesthesia Plan  ASA: 2  Anesthesia Plan: General   Post-op Pain Management:    Induction: Intravenous  PONV Risk Score and Plan: 4 or greater and Treatment may vary due to age or medical condition, Midazolam and Ondansetron  Airway Management Planned: LMA  Additional Equipment: None  Intra-op Plan:   Post-operative Plan:   Informed Consent: I have reviewed the patients History and Physical, chart, labs and discussed the procedure including the risks, benefits and alternatives for the proposed anesthesia with the patient or authorized representative who has indicated his/her understanding and acceptance.     Dental advisory given  Plan Discussed with:   Anesthesia Plan Comments:        Anesthesia Quick Evaluation

## 2022-07-10 ENCOUNTER — Encounter (HOSPITAL_BASED_OUTPATIENT_CLINIC_OR_DEPARTMENT_OTHER): Payer: Self-pay | Admitting: Urology

## 2022-07-10 ENCOUNTER — Other Ambulatory Visit: Payer: Self-pay

## 2022-07-10 ENCOUNTER — Ambulatory Visit (HOSPITAL_BASED_OUTPATIENT_CLINIC_OR_DEPARTMENT_OTHER): Payer: Medicare Other | Admitting: Anesthesiology

## 2022-07-10 ENCOUNTER — Encounter (HOSPITAL_BASED_OUTPATIENT_CLINIC_OR_DEPARTMENT_OTHER)
Admission: RE | Disposition: A | Discharge status: Home / Self Care | Payer: Self-pay | Source: Home / Self Care | Attending: Urology

## 2022-07-10 ENCOUNTER — Ambulatory Visit (HOSPITAL_BASED_OUTPATIENT_CLINIC_OR_DEPARTMENT_OTHER)
Admission: RE | Admit: 2022-07-10 | Discharge: 2022-07-10 | Disposition: A | Discharge status: Home / Self Care | Payer: Medicare Other | Attending: Urology | Admitting: Urology

## 2022-07-10 DIAGNOSIS — Z8551 Personal history of malignant neoplasm of bladder: Secondary | ICD-10-CM | POA: Insufficient documentation

## 2022-07-10 DIAGNOSIS — D494 Neoplasm of unspecified behavior of bladder: Secondary | ICD-10-CM

## 2022-07-10 DIAGNOSIS — Z01818 Encounter for other preprocedural examination: Secondary | ICD-10-CM

## 2022-07-10 DIAGNOSIS — N3289 Other specified disorders of bladder: Secondary | ICD-10-CM | POA: Insufficient documentation

## 2022-07-10 DIAGNOSIS — Z08 Encounter for follow-up examination after completed treatment for malignant neoplasm: Secondary | ICD-10-CM | POA: Diagnosis not present

## 2022-07-10 HISTORY — DX: Other specified disorders of bone density and structure, unspecified site: M85.80

## 2022-07-10 HISTORY — PX: CYSTOSCOPY WITH BIOPSY: SHX5122

## 2022-07-10 HISTORY — PX: CYSTOSCOPY WITH FULGERATION: SHX6638

## 2022-07-10 HISTORY — DX: Arthropathic psoriasis, unspecified: L40.50

## 2022-07-10 SURGERY — CYSTOSCOPY, WITH BIOPSY
Anesthesia: General | Site: Bladder

## 2022-07-10 MED ORDER — FENTANYL CITRATE (PF) 100 MCG/2ML IJ SOLN
INTRAMUSCULAR | Status: AC
  Filled 2022-07-10: qty 2

## 2022-07-10 MED ORDER — ONDANSETRON HCL 4 MG/2ML IJ SOLN: 4.0000 mg | Freq: Once | INTRAMUSCULAR | Status: DC | PRN

## 2022-07-10 MED ORDER — FENTANYL CITRATE (PF) 100 MCG/2ML IJ SOLN
INTRAMUSCULAR | Status: DC | PRN
  Administered 2022-07-10: 50 ug via INTRAVENOUS

## 2022-07-10 MED ORDER — DEXAMETHASONE SODIUM PHOSPHATE 4 MG/ML IJ SOLN
INTRAMUSCULAR | Status: DC | PRN
  Administered 2022-07-10: 8 mg via INTRAVENOUS

## 2022-07-10 MED ORDER — TRAMADOL HCL 50 MG PO TABS: 50.0000 mg | ORAL_TABLET | Freq: Four times a day (QID) | ORAL | 0 refills | Status: AC | PRN

## 2022-07-10 MED ORDER — CEFAZOLIN SODIUM-DEXTROSE 2-4 GM/100ML-% IV SOLN
INTRAVENOUS | Status: AC
  Filled 2022-07-10: qty 100

## 2022-07-10 MED ORDER — ACETAMINOPHEN 10 MG/ML IV SOLN
INTRAVENOUS | Status: AC
  Filled 2022-07-10: qty 100

## 2022-07-10 MED ORDER — FENTANYL CITRATE (PF) 100 MCG/2ML IJ SOLN: 25.0000 ug | INTRAMUSCULAR | Status: DC | PRN

## 2022-07-10 MED ORDER — LIDOCAINE HCL (CARDIAC) PF 100 MG/5ML IV SOSY
PREFILLED_SYRINGE | INTRAVENOUS | Status: DC | PRN
  Administered 2022-07-10: 50 mg via INTRAVENOUS

## 2022-07-10 MED ORDER — CEFAZOLIN SODIUM-DEXTROSE 2-4 GM/100ML-% IV SOLN
2.0000 g | INTRAVENOUS | Status: AC
  Administered 2022-07-10: 2 g via INTRAVENOUS

## 2022-07-10 MED ORDER — PROPOFOL 10 MG/ML IV BOLUS
INTRAVENOUS | Status: DC | PRN
  Administered 2022-07-10: 150 mg via INTRAVENOUS

## 2022-07-10 MED ORDER — ONDANSETRON HCL 4 MG/2ML IJ SOLN
INTRAMUSCULAR | Status: DC | PRN
  Administered 2022-07-10: 4 mg via INTRAVENOUS

## 2022-07-10 MED ORDER — LACTATED RINGERS IV SOLN: INTRAVENOUS | Status: DC

## 2022-07-10 MED ORDER — STERILE WATER FOR IRRIGATION IR SOLN
Status: DC | PRN
  Administered 2022-07-10: 3000 mL

## 2022-07-10 MED ORDER — ACETAMINOPHEN 10 MG/ML IV SOLN
1000.0000 mg | Freq: Once | INTRAVENOUS | Status: DC | PRN
  Administered 2022-07-10: 1000 mg via INTRAVENOUS

## 2022-07-10 SURGICAL SUPPLY — 14 items
BAG DRAIN URO-CYSTO SKYTR STRL (DRAIN) ×3 IMPLANT
BAG DRN UROCATH (DRAIN) ×1
CLOTH BEACON ORANGE TIMEOUT ST (SAFETY) ×3 IMPLANT
ELECT REM PT RETURN 9FT ADLT (ELECTROSURGICAL) ×2
ELECTRODE REM PT RTRN 9FT ADLT (ELECTROSURGICAL) ×2 IMPLANT
GLOVE BIOGEL PI IND STRL 7.0 (GLOVE) IMPLANT
GLOVE BIOGEL PI INDICATOR 7.0 (GLOVE) ×2
GLOVE SURG SS PI 6.0 STRL IVOR (GLOVE) ×1 IMPLANT
GOWN STRL REUS W/TWL LRG LVL3 (GOWN DISPOSABLE) ×3 IMPLANT
KIT TURNOVER CYSTO (KITS) ×3 IMPLANT
MANIFOLD NEPTUNE II (INSTRUMENTS) ×3 IMPLANT
PACK CYSTO (CUSTOM PROCEDURE TRAY) ×3 IMPLANT
TUBE CONNECTING 12X1/4 (SUCTIONS) ×3 IMPLANT
WATER STERILE IRR 3000ML UROMA (IV SOLUTION) ×3 IMPLANT

## 2022-07-10 NOTE — Interval H&P Note (Signed)
History and Physical Interval Note:  07/10/2022 1:58 PM  Christina Campos  has presented today for surgery, with the diagnosis of BLADDER CANCER.  The various methods of treatment have been discussed with the patient and family. After consideration of risks, benefits and other options for treatment, the patient has consented to  Procedure(s) with comments: CYSTOSCOPY WITH BIOPSY (N/A) - [place] (N/A) as a surgical intervention.  The patient's history has been reviewed, patient examined, no change in status, stable for surgery.  I have reviewed the patient's chart and labs.  Questions were answered to the patient's satisfaction.     Mana Morison D Ginette Bradway

## 2022-07-10 NOTE — H&P (Signed)
CC/HPI: cc: Bladder cancer   11/22/2020: 71 year old woman with a history of low-grade Ta bladder cancer diagnosed in 2016 with recurrence in December 2018 here for surveillance cystoscopy. Last surveillance cystoscopy was in October 2020. Patient denies any gross hematuria. She does report increased lower extremity swelling and urinary frequency and feeling of incomplete emptying. She also reports mild stress urinary incontinence with bending or certain activities.   06/22/2022: 71 year old woman with a history of low-grade Ta bladder cancer diagnosed in 2016 with recurrence in December 2018 here for surveillance cystoscopy. At her last visit she also had mild stress urinary continence and underwent pelvic floor physical therapy. She reports no longer having any stress urinary incontinence after she completed the therapy. She denies any gross hematuria since her last cystoscopy.     ALLERGIES: CeleXA TABS Codeine Lipitor TABS Lisinopril TABS Wellbutrin TABS Zocor TABS Zoloft TABS    MEDICATIONS: Ibuprofen  Leflunomide 20 mg tablet  Premarin     GU PSH: Cystoscopy - 11/22/2020, 2020, 2020, 2019, 2018, 2017 Cystoscopy Insert Stent - 2016 Cystoscopy TURBT <2 cm - 2018 Cystoscopy TURBT 2-5 cm - 2016 Hysterectomy Unilat SO - 2016       PSH Notes: Cystoscopy With Fulguration Medium Lesion (2-5cm), Cystoscopy With Insertion Of Ureteral Stent Left, Cholecystectomy, Jaw Surgery, Hysterectomy   NON-GU PSH: Cholecystectomy (open) - 2016     GU PMH: Incomplete bladder emptying - 03/31/2021, - 02/24/2021, - 2022, - 11/22/2020 Stress Incontinence - 03/31/2021, - 02/24/2021, - 2022, Female stress incontinence, - 2016 Urinary Frequency - 03/31/2021, - 11/22/2020 History of bladder cancer - 11/22/2020 Bladder Cancer Lateral - 2020, - 2020, - 2019, - 2019, - 2018, - 2017 Dysuria - 2019 Urinary Urgency - 2018 Other dorsalgia - 2017 Bladder Cancer Trigone , Malignant neoplasm of trigone of urinary  bladder - 2017 Other Disorders Of Bladder, Bladder mass - 2016 Gross hematuria, Gross hematuria - 2016    NON-GU PMH: Muscle weakness (generalized) - 03/31/2021, - 02/24/2021, - 2022 Other muscle spasm - 03/31/2021, - 02/24/2021, - 2022 Other specified disorders of muscle - 03/31/2021, - 02/24/2021, - 2022 Encounter for general adult medical examination without abnormal findings, Encounter for preventive health examination - 2016 Allergy status to unspecified drugs, medicaments and biological substances, History of seasonal allergies - 2016 Deviated nasal septum, Deviated nasal septum - 2016 Diverticulitis of intestine, part unspecified, without perforation or abscess without bleeding, Diverticulitis - 2016 Diverticulosis, Diverticulosis - 2016 Personal history of gestational diabetes, History of gestational diabetes mellitus (GDM) - 2016 Personal history of other diseases of the circulatory system, History of hypertension - 2016 Personal history of other diseases of the musculoskeletal system and connective tissue, History of arthritis - 2016 Personal history of other diseases of the respiratory system, History of asthma - 2016 Personal history of other endocrine, nutritional and metabolic disease, History of hyperlipidemia - 2016    FAMILY HISTORY: cerebrovascular accident (CVA) - Runs In Family Coronary Artery Disease - Runs In Family Diabetes - Runs In Family Hypertension - Runs In Family Kidney Stones - Runs In Family Myocardial Infarction - Runs In Family   SOCIAL HISTORY: Marital Status: Married Preferred Language: English; Ethnicity: Not Hispanic Or Latino; Race: White Current Smoking Status: Patient does not smoke anymore.  Social Drinker.  Drinks 1 caffeinated drink per day. Patient's occupation Psychologist, educational.     Notes: Caffeine use, Occupation, Father deceased, Number of children, Never a smoker, Alcohol use, Married   REVIEW OF SYSTEMS:    GU Review  Female:   Patient denies  frequent urination, hard to postpone urination, burning /pain with urination, get up at night to urinate, leakage of urine, stream starts and stops, trouble starting your stream, have to strain to urinate, and being pregnant.  Gastrointestinal (Upper):   Patient denies nausea, vomiting, and indigestion/ heartburn.  Gastrointestinal (Lower):   Patient denies diarrhea and constipation.  Constitutional:   Patient denies fever, night sweats, weight loss, and fatigue.  Skin:   Patient denies skin rash/ lesion and itching.  Eyes:   Patient denies blurred vision and double vision.  Ears/ Nose/ Throat:   Patient denies sinus problems and sore throat.  Hematologic/Lymphatic:   Patient denies swollen glands and easy bruising.  Cardiovascular:   Patient denies leg swelling and chest pains.  Respiratory:   Patient denies cough and shortness of breath.  Endocrine:   Patient denies excessive thirst.  Musculoskeletal:   Patient denies back pain and joint pain.  Neurological:   Patient denies headaches and dizziness.  Psychologic:   Patient denies depression and anxiety.   VITAL SIGNS: None   MULTI-SYSTEM PHYSICAL EXAMINATION:    Constitutional: Well-nourished. No physical deformities. Normally developed. Good grooming.  Neck: Neck symmetrical, not swollen. Normal tracheal position.  Respiratory: No labored breathing, no use of accessory muscles.   Skin: No paleness, no jaundice, no cyanosis. No lesion, no ulcer, no rash.  Neurologic / Psychiatric: Oriented to time, oriented to place, oriented to person. No depression, no anxiety, no agitation.  Eyes: Normal conjunctivae. Normal eyelids.  Ears, Nose, Mouth, and Throat: Left ear no scars, no lesions, no masses. Right ear no scars, no lesions, no masses. Nose no scars, no lesions, no masses. Normal hearing. Normal lips.  Musculoskeletal: Normal gait and station of head and neck.     Complexity of Data:  Records Review:   Previous Patient Records, POC Tool   Urine Test Review:   Urinalysis   PROCEDURES:         Flexible Cystoscopy - 52000  Risks, benefits, and some of the potential complications of the procedure were discussed at length with the patient including infection, bleeding, voiding discomfort, urinary retention, fever, chills, sepsis, and others. All questions were answered. Informed consent was obtained. Antibiotic prophylaxis was given. Sterile technique and intraurethral analgesia were used.  Meatus:  Normal size. Normal location. Normal condition.  Urethra:  No hypermobility. No leakage.  Ureteral Orifices:  Normal location. Normal size. Normal shape. Effluxed clear urine.  Bladder:  No trabeculation. No tumors. Normal mucosa. No stones. Well-healed prior TUR scar at the right anterior lateral wall. There are some surrounding erythema next to the scar concerning for possible early recurrence.      The lower urinary tract was carefully examined. The procedure was well-tolerated and without complications. Antibiotic instructions were given. Instructions were given to call the office immediately for bloody urine, difficulty urinating, urinary retention, painful or frequent urination, fever, chills, nausea, vomiting or other illness. The patient stated that she understood these instructions and would comply with them.         Urinalysis w/Scope Dipstick Dipstick Cont'd Micro  Color: Yellow Bilirubin: Neg mg/dL WBC/hpf: 6 - 10/hpf  Appearance: Slightly Cloudy Ketones: Neg mg/dL RBC/hpf: 0 - 2/hpf  Specific Gravity: 1.025 Blood: Neg ery/uL Bacteria: Mod (26-50/hpf)  pH: 5.5 Protein: Neg mg/dL Cystals: NS (Not Seen)  Glucose: Neg mg/dL Urobilinogen: 0.2 mg/dL Casts: NS (Not Seen)    Nitrites: Neg Trichomonas: Not Present    Leukocyte Esterase:  1+ leu/uL Mucous: Present      Epithelial Cells: 10 - 20/hpf      Yeast: NS (Not Seen)      Sperm: Not Present    ASSESSMENT:      ICD-10 Details  1 GU:   Bladder Cancer Lateral - C67.2  Chronic, Stable   PLAN:           Document Letter(s):  Created for Patient: Clinical Summary         Notes:   Bladder cancer: Discussed cystoscopic findings concerning for possible bladder cancer recurrence. We discussed proceeding with cystoscopy with bladder biopsy and fulguration. Risks and benefits of procedure discussed the patient in detail including but not limited pain, bleeding, infection, damage to surrounding structures, need Foley catheter, need for additional treatment.   She will be scheduled for next available surgery date

## 2022-07-10 NOTE — Interval H&P Note (Signed)
History and Physical Interval Note:  07/10/2022 1:58 PM  Christina Campos  has presented today for surgery, with the diagnosis of BLADDER CANCER.  The various methods of treatment have been discussed with the patient and family. After consideration of risks, benefits and other options for treatment, the patient has consented to  Procedure(s) with comments: CYSTOSCOPY WITH BIOPSY (N/A) - [place] (N/A) as a surgical intervention.  The patient's history has been reviewed, patient examined, no change in status, stable for surgery.  I have reviewed the patient's chart and labs.  Questions were answered to the patient's satisfaction.     Christina Campos D Curlie Sittner

## 2022-07-10 NOTE — Transfer of Care (Signed)
Immediate Anesthesia Transfer of Care Note  Patient: Christina Campos  Procedure(s) Performed: CYSTOSCOPY WITH BIOPSY (Bladder) CYSTOSCOPY WITH FULGERATION (Bladder)  Patient Location: PACU  Anesthesia Type:General  Level of Consciousness: awake, alert , oriented and patient cooperative  Airway & Oxygen Therapy: Patient Spontanous Breathing and Patient connected to nasal cannula oxygen  Post-op Assessment: Report given to RN and Post -op Vital signs reviewed and stable  Post vital signs: Reviewed and stable  Last Vitals:  Vitals Value Taken Time  BP 147/80 07/10/22 1449  Temp    Pulse 74 07/10/22 1451  Resp 8 07/10/22 1451  SpO2 97 % 07/10/22 1451  Vitals shown include unvalidated device data.  Last Pain:  Vitals:   07/10/22 1338  TempSrc: Oral  PainSc: 3       Patients Stated Pain Goal: 5 (69/67/89 3810)  Complications: No notable events documented.

## 2022-07-10 NOTE — Op Note (Signed)
PATIENT:  Christina Campos  PRE-OPERATIVE DIAGNOSIS: Bladder tumor  POST-OPERATIVE DIAGNOSIS: Same  PROCEDURE:  Procedure(s): 1. Cystoscopy with bladder biopsy and fulguration  SURGEON:  Jacalyn Lefevre, MD  ANESTHESIA:   General  EBL:  Minimal  DRAINS: none  SPECIMEN:  Bladder tumor  DISPOSITION OF SPECIMEN:  PATHOLOGY  Findings: 1.  Normal urethra 2.  Bilateral orthotopic ureteral orifices 3.  Well-healed prior TUR scar noted on the right anterior lateral wall with some erythema on the medial border; no distinct bladder tumor recurrence  Indication:  71 year old woman with a history of low-grade Ta bladder cancer diagnosed in 2016 with recurrence in December 2018 noted to have some subtle erythema near prior TUR scar on right lateral wall concerning for early recurrence.  Description of operation: The patient was taken to the operating room and administered general anesthesia. They were then placed on the table and moved to the dorsal lithotomy position after which the genitalia was sterilely prepped and draped. An official timeout was then performed.  The 21 French rigid cystoscope was placed in the urethra meatus and advanced in the bladder and direct visualization. The bladder was fully and systematically inspected. Ureteral orifices were noted to be in the normal anatomic positions.   The previously identified erythematous mucosa was seen medial to the prior TUR scar on the right anterior lateral wall.  A cold cup bladder biopsy was taken of this area.  The Bugbee was then used to fulgurate to achieve hemostasis and cauterize any erythematous areas..   Reinspection of the bladder revealed that hemostasis was adequate with the irrigant turned off.  The patient was awakened and taken to the recovery room.   PLAN OF CARE: Discharge to home after PACU  PATIENT DISPOSITION:  PACU - hemodynamically stable.

## 2022-07-10 NOTE — Discharge Instructions (Addendum)
Bladder Tumor   Definition:  Bladder biopsy with fulguration is a surgical procedure used to diagnose and remove tumors within the bladder.   General instructions:     Your recent bladder surgery requires very little post hospital care but some definite precautions.  Despite the fact that no skin incisions were used, the area around the bladder incisions are raw and covered with scabs to promote healing and prevent bleeding. Certain precautions are needed to insure that the scabs are not disturbed over the next 2-4 weeks while the healing proceeds.  Because the raw surface inside your bladder and the irritating effects of urine you may expect frequency of urination and/or urgency (a stronger desire to urinate) and perhaps even getting up at night more often. This will usually resolve or improve slowly over the healing period. You may see some blood in your urine over the first 6 weeks. Do not be alarmed, even if the urine was clear for a while. Get off your feet and drink lots of fluids until clearing occurs. If you start to pass clots or don't improve call us.  Catheter: (If you are discharged with a catheter.)  1. Keep your catheter secured to your leg at all times with tape or the supplied strap. 2. You may experience leakage of urine around your catheter- as long as the  catheter continues to drain, this is normal.  If your catheter stops draining  go to the ER. 3. You may also have blood in your urine, even after it has been clear for  several days; you may even pass some small blood clots or other material.  This  is normal as well.  If this happens, sit down and drink plenty of water to help  make urine to flush out your bladder.  If the blood in your urine becomes worse  after doing this, contact our office or return to the ER. 4. You may use the leg bag (small bag) during the day, but use the large bag at  night.  Diet:  You may return to your normal diet immediately. Because of  the raw surface of your bladder, alcohol, spicy foods, foods high in acid and drinks with caffeine may cause irritation or frequency and should be used in moderation. To keep your urine flowing freely and avoid constipation, drink plenty of fluids during the day (8-10 glasses). Tip: Avoid cranberry juice because it is very acidic.  Activity:  Your physical activity doesn't need to be restricted. However, if you are very active, you may see some blood in the urine. We suggest that you reduce your activity under the circumstances until the bleeding has stopped.  Bowels:  It is important to keep your bowels regular during the postoperative period. Straining with bowel movements can cause bleeding. A bowel movement every other day is reasonable. Use a mild laxative if needed, such as milk of magnesia 2-3 tablespoons, or 2 Dulcolax tablets. Call if you continue to have problems. If you had been taking narcotics for pain, before, during or after your surgery, you may be constipated. Take a laxative if necessary.    Medication:  You should resume your pre-surgery medications unless told not to. In addition you may be given an antibiotic to prevent or treat infection. Antibiotics are not always necessary. All medication should be taken as prescribed until the bottles are finished unless you are having an unusual reaction to one of the drugs.     No acetaminophen/Tylenol until after  9 pm today if needed.      Post Anesthesia Home Care Instructions  Activity: Get plenty of rest for the remainder of the day. A responsible individual must stay with you for 24 hours following the procedure.  For the next 24 hours, DO NOT: -Drive a car -Paediatric nurse -Drink alcoholic beverages -Take any medication unless instructed by your physician -Make any legal decisions or sign important papers.  Meals: Start with liquid foods such as gelatin or soup. Progress to regular foods as tolerated. Avoid  greasy, spicy, heavy foods. If nausea and/or vomiting occur, drink only clear liquids until the nausea and/or vomiting subsides. Call your physician if vomiting continues.  Special Instructions/Symptoms: Your throat may feel dry or sore from the anesthesia or the breathing tube placed in your throat during surgery. If this causes discomfort, gargle with warm salt water. The discomfort should disappear within 24 hours.

## 2022-07-10 NOTE — Anesthesia Procedure Notes (Signed)
Procedure Name: LMA Insertion Date/Time: 07/10/2022 2:25 PM  Performed by: Georgeanne Nim, CRNAPre-anesthesia Checklist: Patient identified, Emergency Drugs available, Suction available, Patient being monitored and Timeout performed Patient Re-evaluated:Patient Re-evaluated prior to induction Oxygen Delivery Method: Circle system utilized Preoxygenation: Pre-oxygenation with 100% oxygen Induction Type: IV induction Ventilation: Mask ventilation without difficulty LMA Size: 4.0 Number of attempts: 1 Placement Confirmation: positive ETCO2, CO2 detector and breath sounds checked- equal and bilateral Tube secured with: Tape Dental Injury: Teeth and Oropharynx as per pre-operative assessment

## 2022-07-11 ENCOUNTER — Encounter (HOSPITAL_BASED_OUTPATIENT_CLINIC_OR_DEPARTMENT_OTHER): Payer: Self-pay | Admitting: Urology

## 2022-07-11 LAB — SURGICAL PATHOLOGY

## 2022-07-11 NOTE — Anesthesia Postprocedure Evaluation (Signed)
Anesthesia Post Note  Patient: Christina Campos  Procedure(s) Performed: CYSTOSCOPY WITH BIOPSY (Bladder) CYSTOSCOPY WITH FULGERATION (Bladder)     Patient location during evaluation: PACU Anesthesia Type: General Level of consciousness: awake and alert Pain management: pain level controlled Vital Signs Assessment: post-procedure vital signs reviewed and stable Respiratory status: spontaneous breathing, nonlabored ventilation, respiratory function stable and patient connected to nasal cannula oxygen Cardiovascular status: blood pressure returned to baseline and stable Postop Assessment: no apparent nausea or vomiting Anesthetic complications: no   No notable events documented.  Last Vitals:  Vitals:   07/10/22 1530 07/10/22 1600  BP: (!) 151/82 (!) 146/84  Pulse: 69 71  Resp: 10 16  Temp:  (!) 36.3 C  SpO2: 96% 97%    Last Pain:  Vitals:   07/10/22 1600  TempSrc:   PainSc: 0-No pain                 Barnet Glasgow

## 2022-07-13 IMAGING — DX DG ABD PORTABLE 1V
1 series · 1 of 1 positions shown · non-contrast
Comparison: 11/06/2021 plain film and CT

CLINICAL DATA: Small bowel protocol

EXAM:
PORTABLE ABDOMEN - 1 VIEW

[abdomen kub]
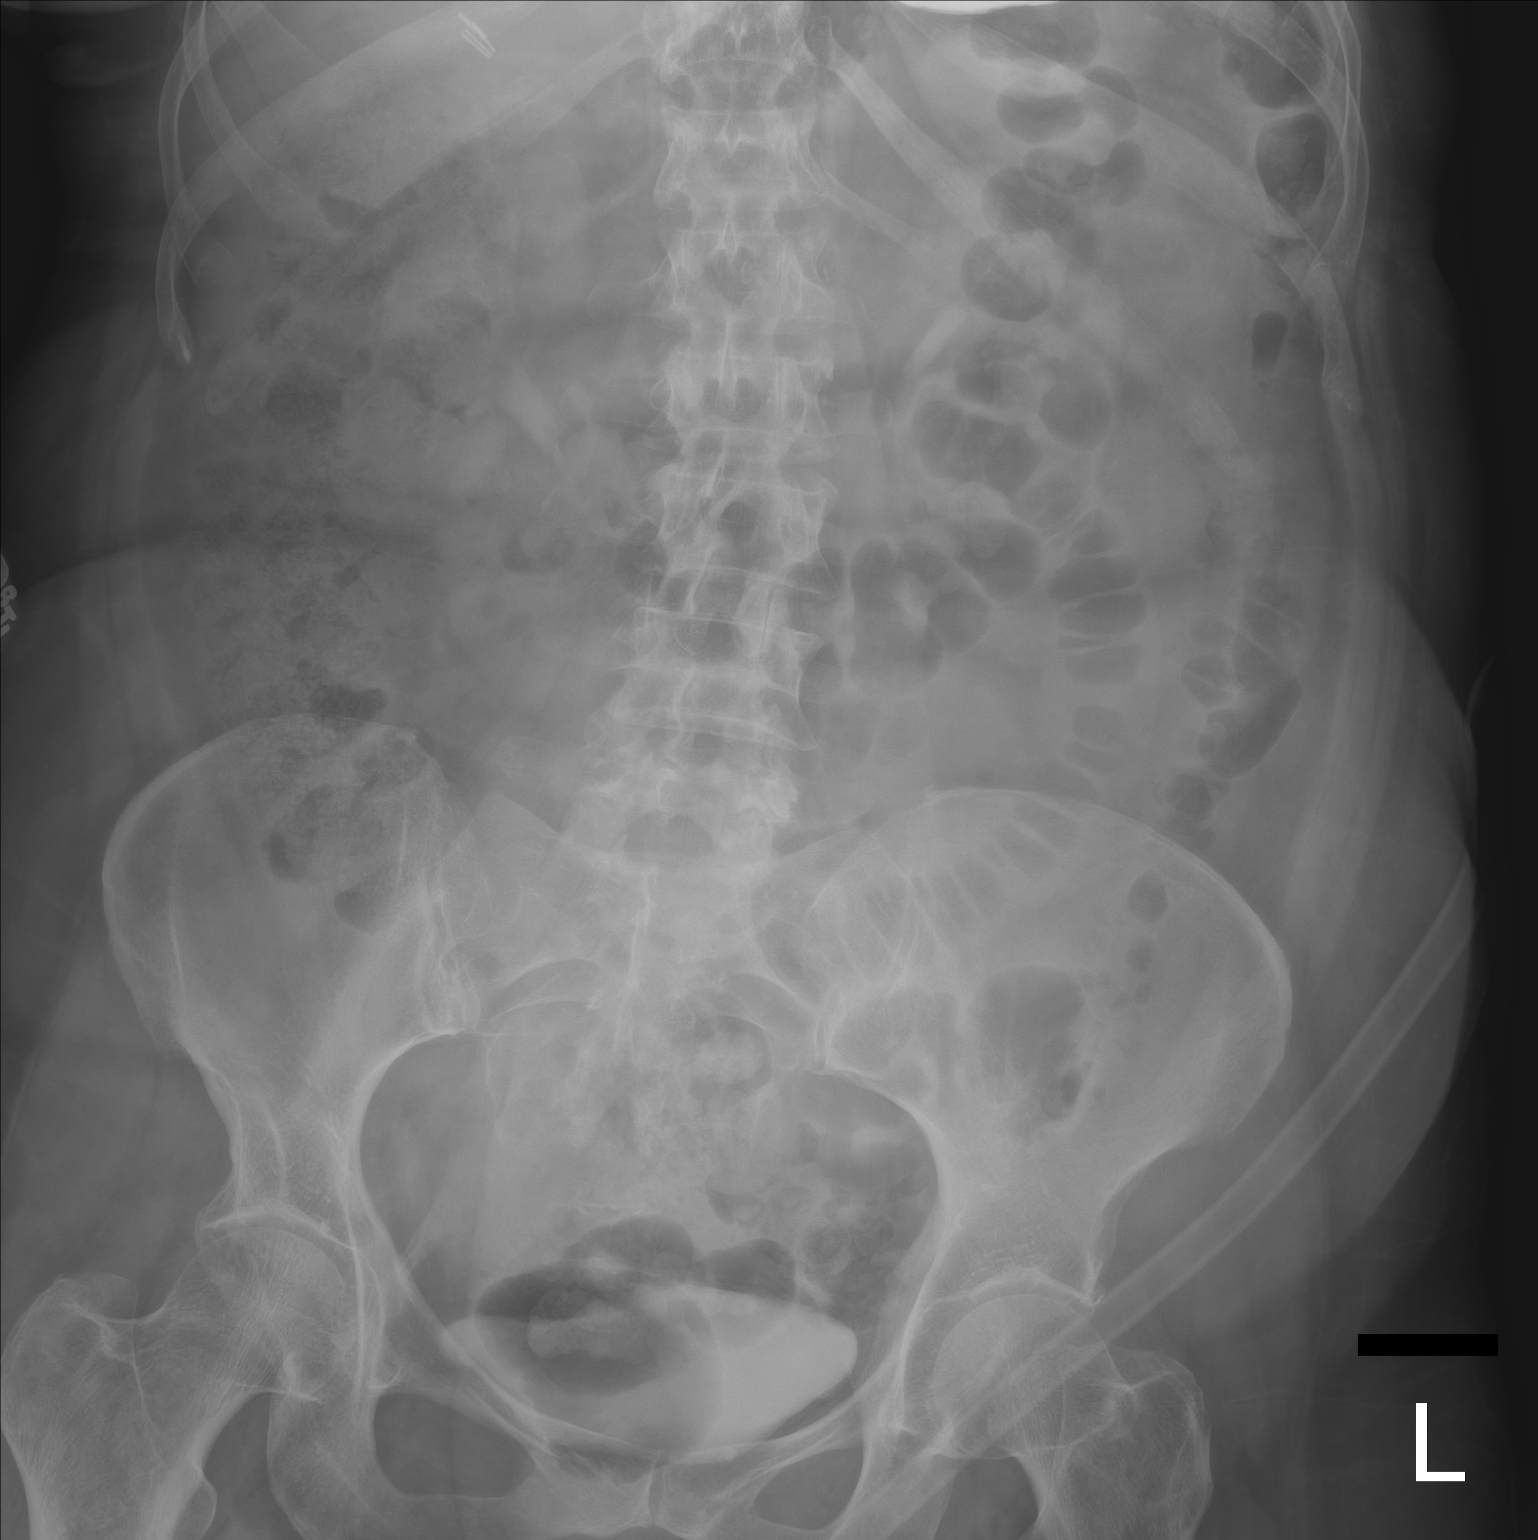

[1 of 1 positions shown; findings below may reference images not displayed]

FINDINGS: Small bowel dilatation decreased since prior CT. Gas and stool noted
within the colon. Prior cholecystectomy. No organomegaly or free
air. Unable to visualize NG tube on this study, but the upper
abdomen/hemidiaphragms are not visualized on this single image.
IMPRESSION: Improving small bowel distention since prior CT.

## 2022-07-13 IMAGING — DX DG ABD PORTABLE 1V
1 series · 1 of 1 positions shown · non-contrast
Comparison: Earlier same day

CLINICAL DATA: NG tube placement.

EXAM:
PORTABLE ABDOMEN - 1 VIEW

[abdomen kub]
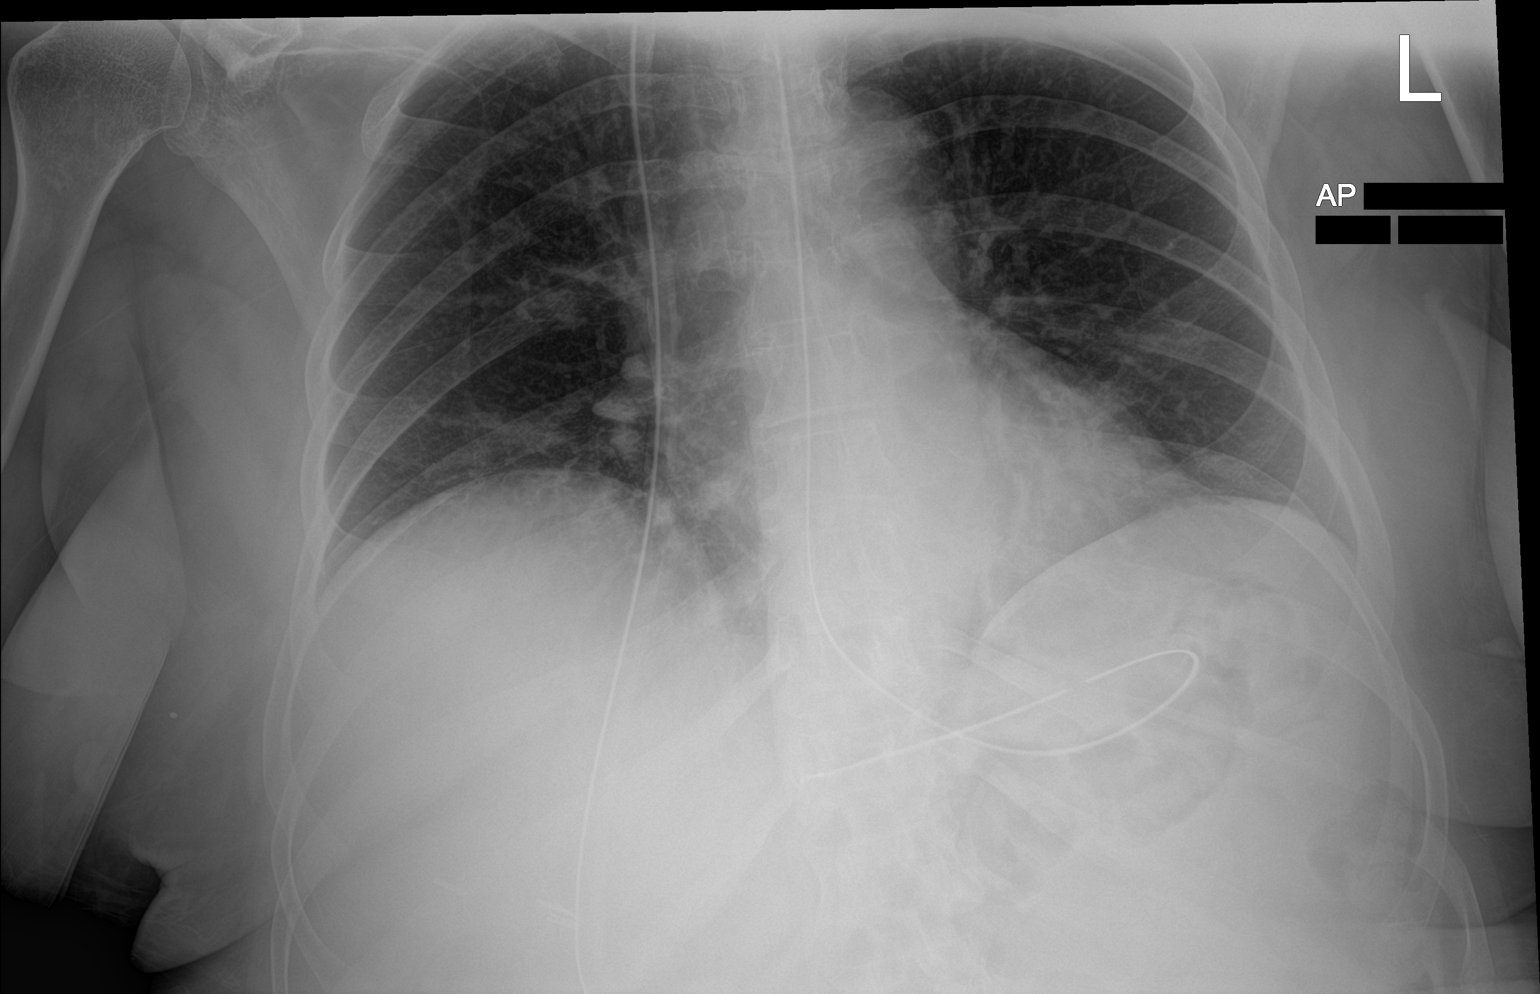

[1 of 1 positions shown; findings below may reference images not displayed]

FINDINGS: 3221 hours. NG tube tip is in the mid to distal stomach with
proximal side port well below the GE junction. Visualized lung bases
are unremarkable. No gaseous bowel dilatation within the visualized
upper abdomen.
IMPRESSION: NG tube tip is in the mid to distal stomach.

## 2022-07-13 IMAGING — CT CT ABD-PELV W/ CM
2 of 5 series · 15 of 46 positions shown, 17 images · IV contrast (omnipaque)
Comparison: CT Abdomen and Pelvis 05/28/2019 and earlier.

CLINICAL DATA: 70-year-old female with abdominal pain over night.
History of bowel obstruction and bladder cancer.

EXAM:
CT ABDOMEN AND PELVIS WITH CONTRAST
TECHNIQUE: Multidetector CT imaging of the abdomen and pelvis was performed
using the standard protocol following bolus administration of
intravenous contrast.
CONTRAST:  80mL OMNIPAQUE IOHEXOL 350 MG/ML SOLN

[Series 2: axial st · axial · 0.86mm/px · z∈[-716,-276]mm · 12 of 105 slices shown, 14 images]
[im 9/105  soft-tissue]
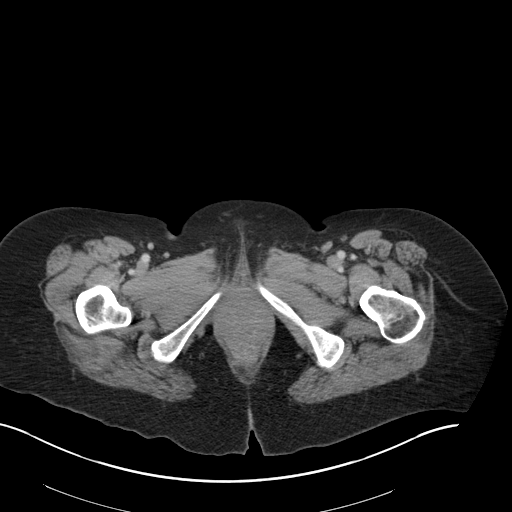
[im 9/105  bone]
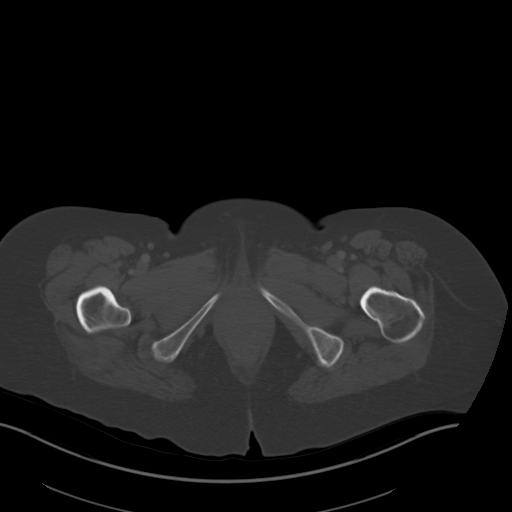
[im 17/105  soft-tissue]
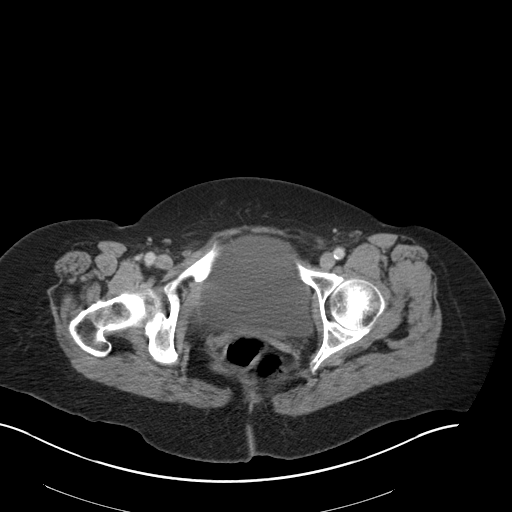
[im 25/105  soft-tissue]
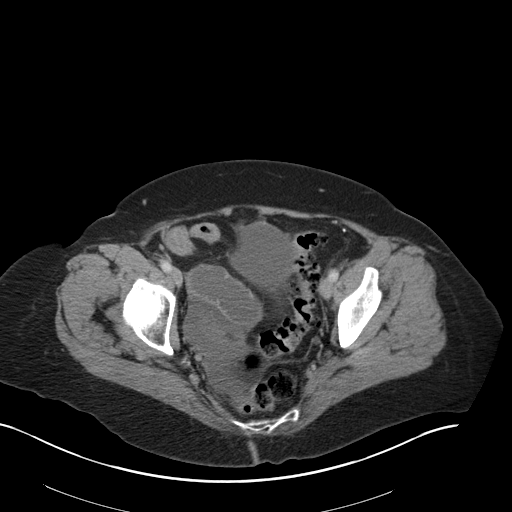
[im 33/105  soft-tissue]
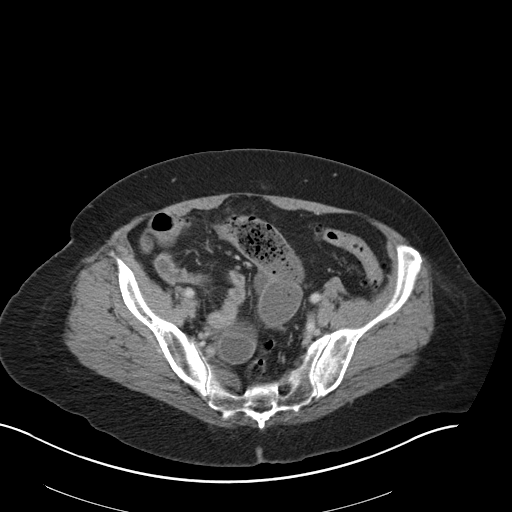
[im 41/105  soft-tissue]
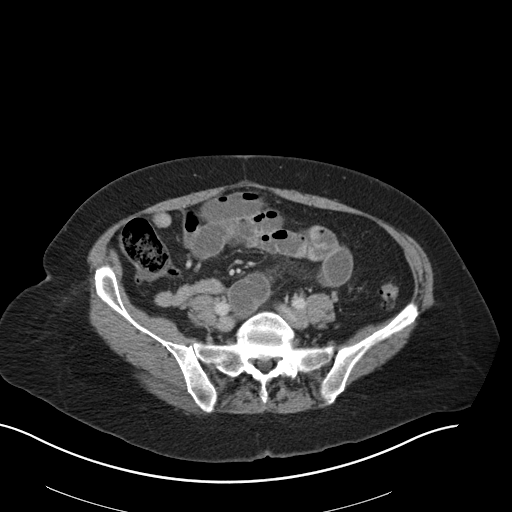
[im 49/105  soft-tissue]
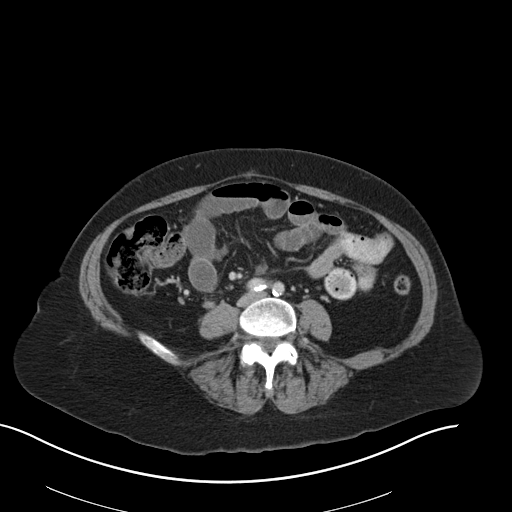
[im 57/105  soft-tissue]
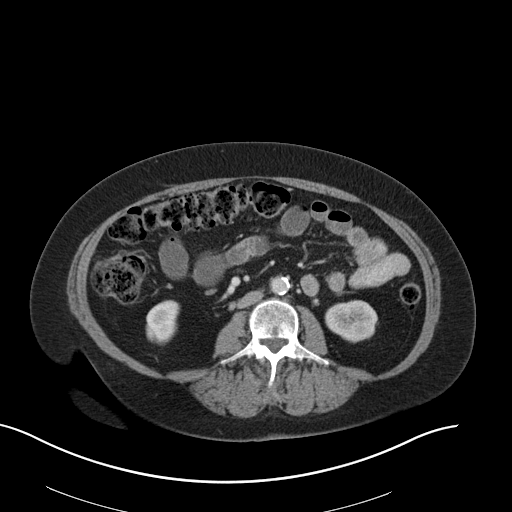
[im 65/105  soft-tissue]
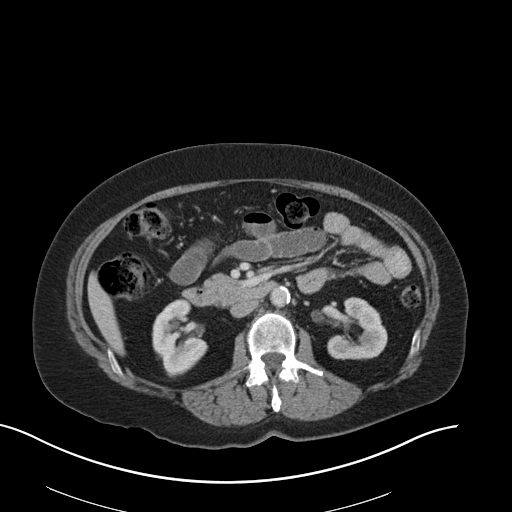
[im 73/105  soft-tissue]
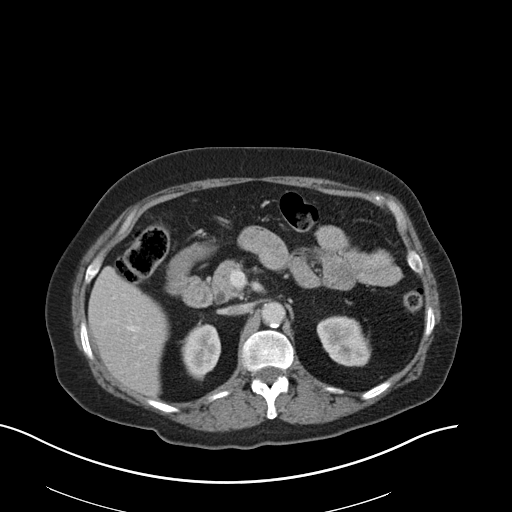
[im 73/105  bone]
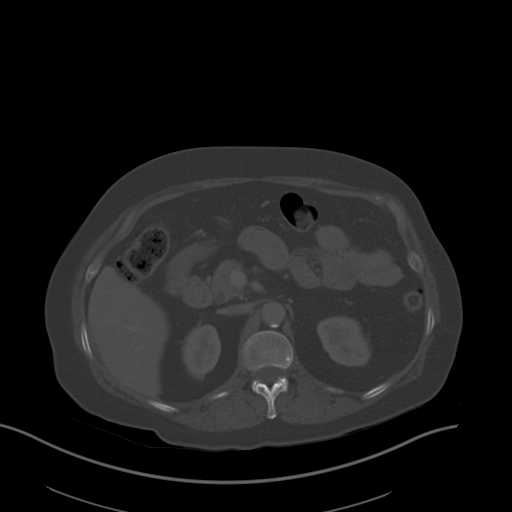
[im 81/105  soft-tissue]
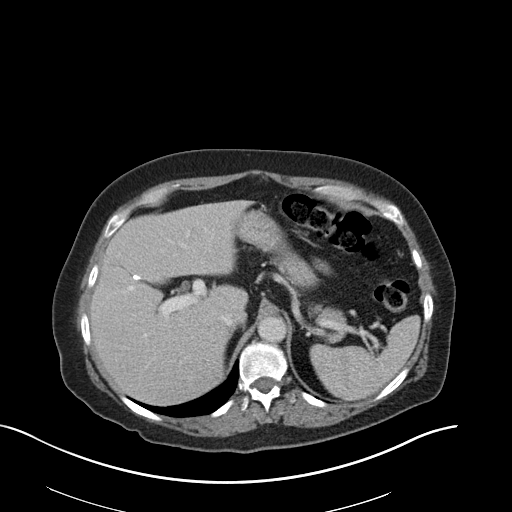
[im 89/105  soft-tissue]
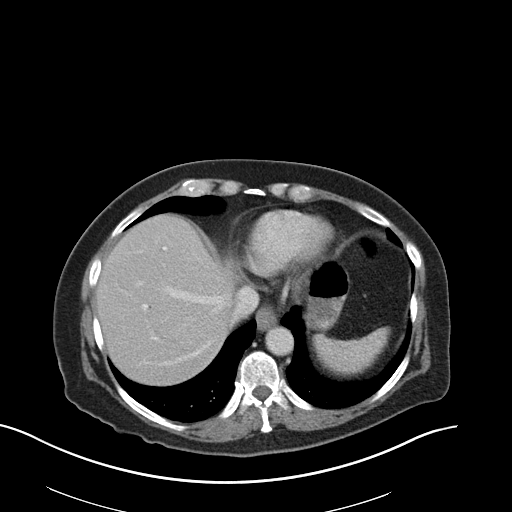
[im 97/105  soft-tissue]
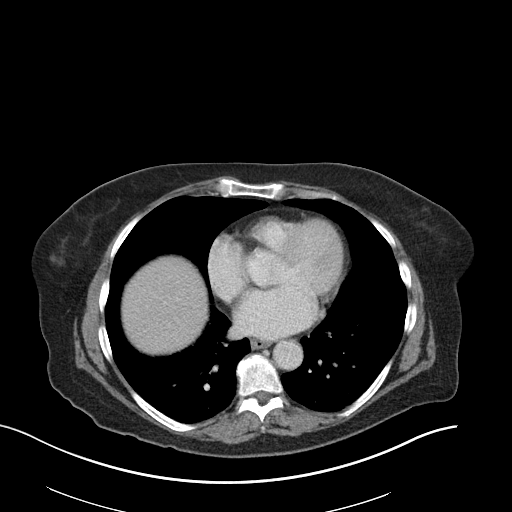

[Series 5: coronal st · coronal · 0.80mm/px · 3 of 143 slices shown]
[im 48/143  soft-tissue]
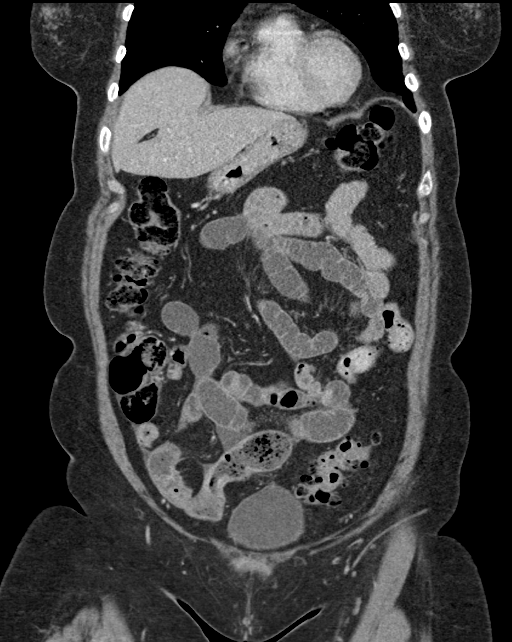
[im 64/143  soft-tissue]
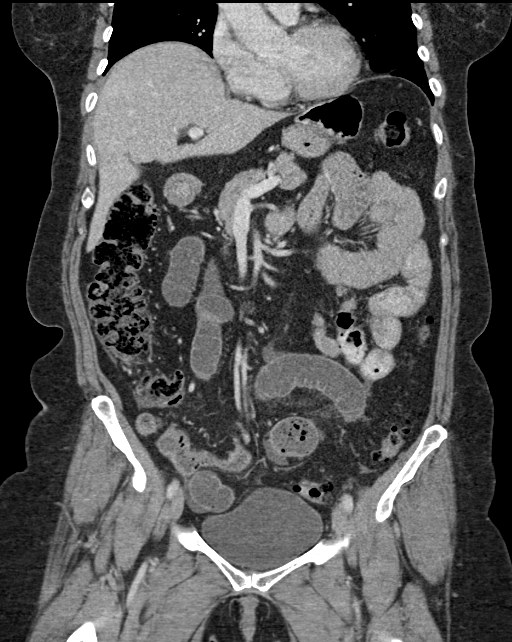
[im 79/143  soft-tissue]
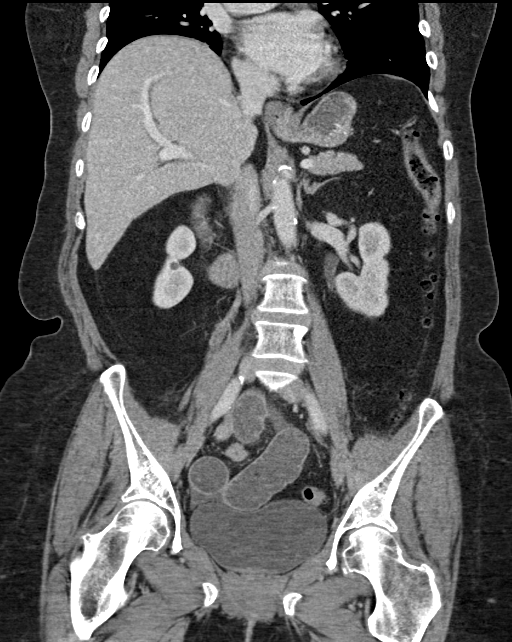

[15 of 46 positions shown; findings below may reference images not displayed]

FINDINGS: Lower chest: Similar lung base atelectasis. No pericardial or
pleural effusion.

Hepatobiliary: Chronically absent gallbladder.  Negative liver.

Pancreas: Negative.

Spleen: Negative.

Adrenals/Urinary Tract: Normal adrenal glands. Kidneys appears
stable and nonobstructed. Unremarkable bladder. Stable pelvic
phleboliths.

Stomach/Bowel: Moderate diverticulosis of the descending and sigmoid
colon. Intermittent diverticula also in the transverse colon.
Largely decompressed large bowel. There is some retained gas and
stool in the right colon. Evidence of a normal appendix on series 2,
image 61.

Decompressed terminal ileum and small bowel loops in the right lower
quadrant. Dilated upstream small bowel loops up to 32 mm diameter
contain fluid, in total loop with flocculated contents (series 2,
image 74 which demonstrates an abrupt transition to decompressed but
thick-walled small bowel in the right lower abdomen (series 2, image
78 and coronal image 46. From there the distal small bowel appears
thickened and inflamed but remains decompressed in the right lower
quadrant. The terminal ileum does not appear inflamed. Scattered
free fluid in the affected distal small bowel mesentery.
Decompressed stomach, duodenum, and proximal jejunum. No free air.

Vascular/Lymphatic: Major arterial structures in the abdomen and
pelvis remain patent. Aortoiliac calcified atherosclerosis. Portal
venous system is patent. No lymphadenopathy.

Reproductive: Surgically absent uterus as before. Diminutive or
absent ovaries.

Other: Small volume pelvic free fluid with simple fluid density,
similar to that in [REDACTED].

Musculoskeletal: Lumbar facet degeneration. No acute or suspicious
osseous lesion.
IMPRESSION: 1. Recurrent Distal Small Bowel Obstruction.
Fairly abrupt transition in the right lower quadrant where
thick-walled and inflamed loops then rema[REDACTED]ompressed to the
terminal ileum. Therefore, consider a bowel obstruction secondary to
Enteritis rather than adhesions. No overtly malignant features of
the abnormal loops.
Flocculated contents just upstream of the transition. Small volume
of simple density free fluid in the lower small bowel mesentery and
the pelvis. No free air.

2. No other acute or metastatic process identified in the abdomen or
pelvis. Aortic Atherosclerosis ([id number]).

## 2022-07-16 DIAGNOSIS — F419 Anxiety disorder, unspecified: Secondary | ICD-10-CM | POA: Diagnosis not present

## 2022-07-20 DIAGNOSIS — Z8551 Personal history of malignant neoplasm of bladder: Secondary | ICD-10-CM | POA: Diagnosis not present

## 2022-07-24 DIAGNOSIS — F419 Anxiety disorder, unspecified: Secondary | ICD-10-CM | POA: Diagnosis not present

## 2022-08-03 DIAGNOSIS — Z6828 Body mass index (BMI) 28.0-28.9, adult: Secondary | ICD-10-CM | POA: Diagnosis not present

## 2022-08-03 DIAGNOSIS — E782 Mixed hyperlipidemia: Secondary | ICD-10-CM | POA: Diagnosis not present

## 2022-08-03 DIAGNOSIS — R635 Abnormal weight gain: Secondary | ICD-10-CM | POA: Diagnosis not present

## 2022-08-03 DIAGNOSIS — L4050 Arthropathic psoriasis, unspecified: Secondary | ICD-10-CM | POA: Diagnosis not present

## 2022-08-06 DIAGNOSIS — F419 Anxiety disorder, unspecified: Secondary | ICD-10-CM | POA: Diagnosis not present

## 2022-08-10 DIAGNOSIS — Z6828 Body mass index (BMI) 28.0-28.9, adult: Secondary | ICD-10-CM | POA: Diagnosis not present

## 2022-08-10 DIAGNOSIS — E559 Vitamin D deficiency, unspecified: Secondary | ICD-10-CM | POA: Diagnosis not present

## 2022-08-20 DIAGNOSIS — F419 Anxiety disorder, unspecified: Secondary | ICD-10-CM | POA: Diagnosis not present

## 2022-08-24 DIAGNOSIS — E782 Mixed hyperlipidemia: Secondary | ICD-10-CM | POA: Diagnosis not present

## 2022-08-24 DIAGNOSIS — N951 Menopausal and female climacteric states: Secondary | ICD-10-CM | POA: Diagnosis not present

## 2022-08-24 DIAGNOSIS — Z6828 Body mass index (BMI) 28.0-28.9, adult: Secondary | ICD-10-CM | POA: Diagnosis not present

## 2022-08-24 DIAGNOSIS — F419 Anxiety disorder, unspecified: Secondary | ICD-10-CM | POA: Diagnosis not present

## 2022-09-12 DIAGNOSIS — F419 Anxiety disorder, unspecified: Secondary | ICD-10-CM | POA: Diagnosis not present

## 2022-09-14 DIAGNOSIS — E663 Overweight: Secondary | ICD-10-CM | POA: Diagnosis not present

## 2022-09-14 DIAGNOSIS — E559 Vitamin D deficiency, unspecified: Secondary | ICD-10-CM | POA: Diagnosis not present

## 2022-09-24 DIAGNOSIS — F419 Anxiety disorder, unspecified: Secondary | ICD-10-CM | POA: Diagnosis not present

## 2022-09-28 DIAGNOSIS — M2550 Pain in unspecified joint: Secondary | ICD-10-CM | POA: Diagnosis not present

## 2022-09-28 DIAGNOSIS — M1990 Unspecified osteoarthritis, unspecified site: Secondary | ICD-10-CM | POA: Diagnosis not present

## 2022-09-28 DIAGNOSIS — Z79899 Other long term (current) drug therapy: Secondary | ICD-10-CM | POA: Diagnosis not present

## 2022-09-28 DIAGNOSIS — M79643 Pain in unspecified hand: Secondary | ICD-10-CM | POA: Diagnosis not present

## 2022-09-28 DIAGNOSIS — L409 Psoriasis, unspecified: Secondary | ICD-10-CM | POA: Diagnosis not present

## 2022-09-28 DIAGNOSIS — R7982 Elevated C-reactive protein (CRP): Secondary | ICD-10-CM | POA: Diagnosis not present

## 2022-09-28 DIAGNOSIS — L609 Nail disorder, unspecified: Secondary | ICD-10-CM | POA: Diagnosis not present

## 2022-09-28 DIAGNOSIS — L4050 Arthropathic psoriasis, unspecified: Secondary | ICD-10-CM | POA: Diagnosis not present

## 2022-10-05 DIAGNOSIS — Z6827 Body mass index (BMI) 27.0-27.9, adult: Secondary | ICD-10-CM | POA: Diagnosis not present

## 2022-10-05 DIAGNOSIS — E782 Mixed hyperlipidemia: Secondary | ICD-10-CM | POA: Diagnosis not present

## 2022-10-08 DIAGNOSIS — F419 Anxiety disorder, unspecified: Secondary | ICD-10-CM | POA: Diagnosis not present

## 2022-10-15 ENCOUNTER — Ambulatory Visit: Payer: Self-pay

## 2022-10-22 DIAGNOSIS — F419 Anxiety disorder, unspecified: Secondary | ICD-10-CM | POA: Diagnosis not present

## 2022-11-05 DIAGNOSIS — F419 Anxiety disorder, unspecified: Secondary | ICD-10-CM | POA: Diagnosis not present

## 2022-11-12 DIAGNOSIS — N951 Menopausal and female climacteric states: Secondary | ICD-10-CM | POA: Diagnosis not present

## 2022-11-12 DIAGNOSIS — E782 Mixed hyperlipidemia: Secondary | ICD-10-CM | POA: Diagnosis not present

## 2022-11-12 DIAGNOSIS — Z6827 Body mass index (BMI) 27.0-27.9, adult: Secondary | ICD-10-CM | POA: Diagnosis not present

## 2022-11-23 DIAGNOSIS — L4050 Arthropathic psoriasis, unspecified: Secondary | ICD-10-CM | POA: Diagnosis not present

## 2022-11-30 DIAGNOSIS — Z8551 Personal history of malignant neoplasm of bladder: Secondary | ICD-10-CM | POA: Diagnosis not present

## 2022-11-30 DIAGNOSIS — C670 Malignant neoplasm of trigone of bladder: Secondary | ICD-10-CM | POA: Diagnosis not present

## 2022-12-03 DIAGNOSIS — F419 Anxiety disorder, unspecified: Secondary | ICD-10-CM | POA: Diagnosis not present

## 2022-12-07 DIAGNOSIS — L4050 Arthropathic psoriasis, unspecified: Secondary | ICD-10-CM | POA: Diagnosis not present

## 2022-12-14 DIAGNOSIS — N951 Menopausal and female climacteric states: Secondary | ICD-10-CM | POA: Diagnosis not present

## 2022-12-14 DIAGNOSIS — N898 Other specified noninflammatory disorders of vagina: Secondary | ICD-10-CM | POA: Diagnosis not present

## 2022-12-14 DIAGNOSIS — E782 Mixed hyperlipidemia: Secondary | ICD-10-CM | POA: Diagnosis not present

## 2022-12-14 DIAGNOSIS — Z6828 Body mass index (BMI) 28.0-28.9, adult: Secondary | ICD-10-CM | POA: Diagnosis not present

## 2022-12-14 DIAGNOSIS — R6882 Decreased libido: Secondary | ICD-10-CM | POA: Diagnosis not present

## 2022-12-14 DIAGNOSIS — R232 Flushing: Secondary | ICD-10-CM | POA: Diagnosis not present

## 2022-12-24 DIAGNOSIS — F419 Anxiety disorder, unspecified: Secondary | ICD-10-CM | POA: Diagnosis not present

## 2022-12-28 DIAGNOSIS — Z6827 Body mass index (BMI) 27.0-27.9, adult: Secondary | ICD-10-CM | POA: Diagnosis not present

## 2022-12-28 DIAGNOSIS — E559 Vitamin D deficiency, unspecified: Secondary | ICD-10-CM | POA: Diagnosis not present

## 2022-12-28 DIAGNOSIS — L4050 Arthropathic psoriasis, unspecified: Secondary | ICD-10-CM | POA: Diagnosis not present

## 2022-12-31 DIAGNOSIS — F419 Anxiety disorder, unspecified: Secondary | ICD-10-CM | POA: Diagnosis not present

## 2023-01-03 DIAGNOSIS — L609 Nail disorder, unspecified: Secondary | ICD-10-CM | POA: Diagnosis not present

## 2023-01-03 DIAGNOSIS — M2550 Pain in unspecified joint: Secondary | ICD-10-CM | POA: Diagnosis not present

## 2023-01-03 DIAGNOSIS — M79643 Pain in unspecified hand: Secondary | ICD-10-CM | POA: Diagnosis not present

## 2023-01-03 DIAGNOSIS — R7982 Elevated C-reactive protein (CRP): Secondary | ICD-10-CM | POA: Diagnosis not present

## 2023-01-03 DIAGNOSIS — Z79899 Other long term (current) drug therapy: Secondary | ICD-10-CM | POA: Diagnosis not present

## 2023-01-03 DIAGNOSIS — L4050 Arthropathic psoriasis, unspecified: Secondary | ICD-10-CM | POA: Diagnosis not present

## 2023-01-03 DIAGNOSIS — M1990 Unspecified osteoarthritis, unspecified site: Secondary | ICD-10-CM | POA: Diagnosis not present

## 2023-01-03 DIAGNOSIS — L409 Psoriasis, unspecified: Secondary | ICD-10-CM | POA: Diagnosis not present

## 2023-01-14 DIAGNOSIS — F419 Anxiety disorder, unspecified: Secondary | ICD-10-CM | POA: Diagnosis not present

## 2023-01-18 DIAGNOSIS — K5730 Diverticulosis of large intestine without perforation or abscess without bleeding: Secondary | ICD-10-CM | POA: Diagnosis not present

## 2023-01-18 DIAGNOSIS — K649 Unspecified hemorrhoids: Secondary | ICD-10-CM | POA: Diagnosis not present

## 2023-01-18 DIAGNOSIS — Z1211 Encounter for screening for malignant neoplasm of colon: Secondary | ICD-10-CM | POA: Diagnosis not present

## 2023-01-21 DIAGNOSIS — F419 Anxiety disorder, unspecified: Secondary | ICD-10-CM | POA: Diagnosis not present

## 2023-01-25 DIAGNOSIS — L4050 Arthropathic psoriasis, unspecified: Secondary | ICD-10-CM | POA: Diagnosis not present

## 2023-02-04 DIAGNOSIS — F419 Anxiety disorder, unspecified: Secondary | ICD-10-CM | POA: Diagnosis not present

## 2023-02-08 DIAGNOSIS — N951 Menopausal and female climacteric states: Secondary | ICD-10-CM | POA: Diagnosis not present

## 2023-02-18 DIAGNOSIS — F419 Anxiety disorder, unspecified: Secondary | ICD-10-CM | POA: Diagnosis not present

## 2023-03-01 DIAGNOSIS — Z6827 Body mass index (BMI) 27.0-27.9, adult: Secondary | ICD-10-CM | POA: Diagnosis not present

## 2023-03-01 DIAGNOSIS — L4050 Arthropathic psoriasis, unspecified: Secondary | ICD-10-CM | POA: Diagnosis not present

## 2023-03-01 DIAGNOSIS — N951 Menopausal and female climacteric states: Secondary | ICD-10-CM | POA: Diagnosis not present

## 2023-03-01 DIAGNOSIS — E782 Mixed hyperlipidemia: Secondary | ICD-10-CM | POA: Diagnosis not present

## 2023-03-04 DIAGNOSIS — F419 Anxiety disorder, unspecified: Secondary | ICD-10-CM | POA: Diagnosis not present

## 2023-03-18 DIAGNOSIS — F419 Anxiety disorder, unspecified: Secondary | ICD-10-CM | POA: Diagnosis not present

## 2023-04-01 DIAGNOSIS — E782 Mixed hyperlipidemia: Secondary | ICD-10-CM | POA: Diagnosis not present

## 2023-04-01 DIAGNOSIS — F419 Anxiety disorder, unspecified: Secondary | ICD-10-CM | POA: Diagnosis not present

## 2023-04-01 DIAGNOSIS — Z6827 Body mass index (BMI) 27.0-27.9, adult: Secondary | ICD-10-CM | POA: Diagnosis not present

## 2023-04-01 DIAGNOSIS — L4050 Arthropathic psoriasis, unspecified: Secondary | ICD-10-CM | POA: Diagnosis not present

## 2023-04-02 ENCOUNTER — Telehealth: Payer: Self-pay

## 2023-04-02 NOTE — Patient Outreach (Signed)
  Care Coordination   04/02/2023 Name: Christina Campos MRN: [id number] DOB: 06-19-1951   Care Coordination Outreach Attempts:  An unsuccessful telephone outreach was attempted today to offer the patient information about available care coordination services.  Follow Up Plan:  Additional outreach attempts will be made to offer the patient care coordination information and services.   Encounter Outcome:  No Answer   Care Coordination Interventions:  No, not indicated    SIG  Dudley Major RN, BSN,CCM, CDE Care Management Coordinator [facility] Care Management [phone]

## 2023-04-15 DIAGNOSIS — F419 Anxiety disorder, unspecified: Secondary | ICD-10-CM | POA: Diagnosis not present

## 2023-04-29 DIAGNOSIS — F419 Anxiety disorder, unspecified: Secondary | ICD-10-CM | POA: Diagnosis not present

## 2023-05-02 DIAGNOSIS — L609 Nail disorder, unspecified: Secondary | ICD-10-CM | POA: Diagnosis not present

## 2023-05-02 DIAGNOSIS — M79643 Pain in unspecified hand: Secondary | ICD-10-CM | POA: Diagnosis not present

## 2023-05-02 DIAGNOSIS — M2550 Pain in unspecified joint: Secondary | ICD-10-CM | POA: Diagnosis not present

## 2023-05-02 DIAGNOSIS — R7982 Elevated C-reactive protein (CRP): Secondary | ICD-10-CM | POA: Diagnosis not present

## 2023-05-02 DIAGNOSIS — M1990 Unspecified osteoarthritis, unspecified site: Secondary | ICD-10-CM | POA: Diagnosis not present

## 2023-05-02 DIAGNOSIS — L409 Psoriasis, unspecified: Secondary | ICD-10-CM | POA: Diagnosis not present

## 2023-05-02 DIAGNOSIS — L4050 Arthropathic psoriasis, unspecified: Secondary | ICD-10-CM | POA: Diagnosis not present

## 2023-05-02 DIAGNOSIS — Z79899 Other long term (current) drug therapy: Secondary | ICD-10-CM | POA: Diagnosis not present

## 2023-05-03 DIAGNOSIS — E782 Mixed hyperlipidemia: Secondary | ICD-10-CM | POA: Diagnosis not present

## 2023-05-03 DIAGNOSIS — N951 Menopausal and female climacteric states: Secondary | ICD-10-CM | POA: Diagnosis not present

## 2023-05-03 DIAGNOSIS — Z6828 Body mass index (BMI) 28.0-28.9, adult: Secondary | ICD-10-CM | POA: Diagnosis not present

## 2023-05-07 DIAGNOSIS — L4050 Arthropathic psoriasis, unspecified: Secondary | ICD-10-CM | POA: Diagnosis not present

## 2023-05-13 DIAGNOSIS — F419 Anxiety disorder, unspecified: Secondary | ICD-10-CM | POA: Diagnosis not present

## 2023-05-16 ENCOUNTER — Emergency Department (HOSPITAL_BASED_OUTPATIENT_CLINIC_OR_DEPARTMENT_OTHER)
Admission: EM | Admit: 2023-05-16 | Discharge: 2023-05-16 | Disposition: A | Discharge status: Home / Self Care | Payer: Medicare Other | Attending: Emergency Medicine | Admitting: Emergency Medicine

## 2023-05-16 ENCOUNTER — Emergency Department (HOSPITAL_BASED_OUTPATIENT_CLINIC_OR_DEPARTMENT_OTHER): Payer: Medicare Other

## 2023-05-16 ENCOUNTER — Emergency Department (HOSPITAL_BASED_OUTPATIENT_CLINIC_OR_DEPARTMENT_OTHER): Payer: Medicare Other | Admitting: Radiology

## 2023-05-16 ENCOUNTER — Encounter (HOSPITAL_BASED_OUTPATIENT_CLINIC_OR_DEPARTMENT_OTHER): Payer: Self-pay | Admitting: Emergency Medicine

## 2023-05-16 DIAGNOSIS — R519 Headache, unspecified: Secondary | ICD-10-CM | POA: Diagnosis not present

## 2023-05-16 DIAGNOSIS — S6991XA Unspecified injury of right wrist, hand and finger(s), initial encounter: Secondary | ICD-10-CM | POA: Diagnosis present

## 2023-05-16 DIAGNOSIS — M25531 Pain in right wrist: Secondary | ICD-10-CM | POA: Diagnosis not present

## 2023-05-16 DIAGNOSIS — Z91040 Latex allergy status: Secondary | ICD-10-CM | POA: Diagnosis not present

## 2023-05-16 DIAGNOSIS — S63501A Unspecified sprain of right wrist, initial encounter: Secondary | ICD-10-CM | POA: Diagnosis not present

## 2023-05-16 DIAGNOSIS — W503XXA Accidental bite by another person, initial encounter: Secondary | ICD-10-CM

## 2023-05-16 DIAGNOSIS — Z23 Encounter for immunization: Secondary | ICD-10-CM | POA: Insufficient documentation

## 2023-05-16 DIAGNOSIS — M79641 Pain in right hand: Secondary | ICD-10-CM | POA: Diagnosis not present

## 2023-05-16 DIAGNOSIS — S0990XA Unspecified injury of head, initial encounter: Secondary | ICD-10-CM | POA: Diagnosis not present

## 2023-05-16 DIAGNOSIS — S199XXA Unspecified injury of neck, initial encounter: Secondary | ICD-10-CM | POA: Diagnosis not present

## 2023-05-16 DIAGNOSIS — F419 Anxiety disorder, unspecified: Secondary | ICD-10-CM | POA: Diagnosis not present

## 2023-05-16 MED ORDER — AMOXICILLIN-POT CLAVULANATE 875-125 MG PO TABS
1.0000 | ORAL_TABLET | Freq: Once | ORAL | Status: AC
  Administered 2023-05-16: 1 via ORAL
  Filled 2023-05-16: qty 1

## 2023-05-16 MED ORDER — TETANUS-DIPHTH-ACELL PERTUSSIS 5-2.5-18.5 LF-MCG/0.5 IM SUSY
0.5000 mL | PREFILLED_SYRINGE | Freq: Once | INTRAMUSCULAR | Status: AC
  Administered 2023-05-16: 0.5 mL via INTRAMUSCULAR
  Filled 2023-05-16: qty 0.5

## 2023-05-16 MED ORDER — AMOXICILLIN-POT CLAVULANATE 875-125 MG PO TABS: 1.0000 | ORAL_TABLET | Freq: Two times a day (BID) | ORAL | 0 refills | Status: AC

## 2023-05-16 NOTE — ED Notes (Signed)
Pt discharged to home, NAD noted at time of discharge 
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
## 2023-05-27 DIAGNOSIS — F419 Anxiety disorder, unspecified: Secondary | ICD-10-CM | POA: Diagnosis not present

## 2023-06-05 ENCOUNTER — Encounter (HOSPITAL_BASED_OUTPATIENT_CLINIC_OR_DEPARTMENT_OTHER): Payer: Self-pay | Admitting: Emergency Medicine

## 2023-06-05 ENCOUNTER — Emergency Department (HOSPITAL_BASED_OUTPATIENT_CLINIC_OR_DEPARTMENT_OTHER)
Admission: EM | Admit: 2023-06-05 | Discharge: 2023-06-05 | Disposition: A | Discharge status: Home / Self Care | Payer: Medicare Other | Attending: Emergency Medicine | Admitting: Emergency Medicine

## 2023-06-05 ENCOUNTER — Other Ambulatory Visit: Payer: Self-pay

## 2023-06-05 DIAGNOSIS — Z91040 Latex allergy status: Secondary | ICD-10-CM | POA: Insufficient documentation

## 2023-06-05 DIAGNOSIS — U071 COVID-19: Secondary | ICD-10-CM | POA: Diagnosis not present

## 2023-06-05 DIAGNOSIS — R509 Fever, unspecified: Secondary | ICD-10-CM | POA: Diagnosis present

## 2023-06-05 LAB — RESP PANEL BY RT-PCR (RSV, FLU A&B, COVID)  RVPGX2
Influenza A by PCR: NEGATIVE
Influenza B by PCR: NEGATIVE
Resp Syncytial Virus by PCR: NEGATIVE
SARS Coronavirus 2 by RT PCR: POSITIVE — AB

## 2023-06-05 MED ORDER — PAXLOVID (300/100) 20 X 150 MG & 10 X 100MG PO TBPK: 3.0000 | ORAL_TABLET | Freq: Two times a day (BID) | ORAL | 0 refills | Status: AC
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
## 2023-06-05 NOTE — Discharge Instructions (Signed)
The CDC now recommends that you isolate at home until fever free for 24 hours.  Paxlovid has been prescribed.  Return to the emergency department in the event of any severe worsening symptoms.  Continue to maintain oral rehydration, take Tylenol and ibuprofen for fever control and pain control.

## 2023-06-05 NOTE — ED Provider Notes (Signed)
[place] EMERGENCY DEPARTMENT AT [facility] Provider Note   CSN: [id number] Arrival date & time: 06/05/23  1140     History  Chief Complaint  Patient presents with   Fever    Christina Campos is a 72 y.o. female.   Fever    72 year old female presenting to the emergency ferment with fever and fatigue for the past 3 days.  The patient denies any cough, chest pain, shortness of breath.  She has had decreased oral intake over the last few days.  Denies any abdominal pain, nausea or vomiting.  Home Medications Prior to Admission medications   Medication Sig Start Date End Date Taking? Authorizing Provider  nirmatrelvir & ritonavir (PAXLOVID, 300/100,) 20 x 150 MG & 10 x 100MG  TBPK Take 3 tablets by mouth 2 (two) times daily for 5 days. 06/05/23 06/10/23 Yes Ernie Avena, MD  acetaminophen (TYLENOL) 325 MG tablet Take 2-3 tablets (650-975 mg total) by mouth every 6 (six) hours as needed for mild pain (or temp > 100). 11/10/21   Adam Phenix, PA-C  Biotin 10 MG CHEW Chew 10 mg by mouth once.    [provider]  Golimumab (SIMPONI [place]) Inject into the skin every 2 (two) months. Administered by Dr Casimer Lanius, [facility] - for psoriatic arthritis Patient not taking: Reported on 07/06/2022    [provider]  ibuprofen (ADVIL,MOTRIN) 200 MG tablet Take 400 mg by mouth 2 (two) times daily. For arthritic pain    [provider]  leflunomide (ARAVA) 20 MG tablet Take 20 mg by mouth daily. 04/21/22   [provider]  traMADol (ULTRAM) 50 MG tablet Take 1 tablet (50 mg total) by mouth every 6 (six) hours as needed. 07/10/22 07/10/23  Noel Christmas, MD  UNABLE TO FIND Premarin patch 0.625 mg  changes patch 2 x week (Tuesday and Friday)    [provider]      Allergies    Codeine, Latex, Lipitor [atorvastatin], Lisinopril, Onion, Wellbutrin [bupropion], Zocor [simvastatin], and Zoloft [sertraline]    Review of Systems    Review of Systems  Constitutional:  Positive for fatigue and fever.  All other systems reviewed and are negative.   Physical Exam Updated Vital Signs BP 123/66   Pulse 88   Temp 98.8 F (37.1 C)   Resp 17   Ht 5\' 6"  (1.676 m)   Wt 82.1 kg   SpO2 98%   BMI 29.21 kg/m  Physical Exam Vitals and nursing note reviewed.  Constitutional:      General: She is not in acute distress.    Appearance: She is well-developed.  HENT:     Head: Normocephalic and atraumatic.  Eyes:     Conjunctiva/sclera: Conjunctivae normal.  Cardiovascular:     Rate and Rhythm: Normal rate and regular rhythm.  Pulmonary:     Effort: Pulmonary effort is normal. No respiratory distress.     Breath sounds: Normal breath sounds.  Abdominal:     Palpations: Abdomen is soft.     Tenderness: There is no abdominal tenderness.  Musculoskeletal:        General: No swelling.     Cervical back: Neck supple.  Skin:    General: Skin is warm and dry.     Capillary Refill: Capillary refill takes less than 2 seconds.  Neurological:     Mental Status: She is alert.  Psychiatric:        Mood and Affect: Mood normal.  ED Results / Procedures / Treatments   Labs (all labs ordered are listed, but only abnormal results are displayed) Labs Reviewed  RESP PANEL BY RT-PCR (RSV, FLU A&B, COVID)  [id number] - Abnormal; Notable for the following components:      Result Value   SARS Coronavirus 2 by RT PCR POSITIVE (*)    All other components within normal limits    EKG None  Radiology No results found.  Procedures Procedures    Medications Ordered in ED Medications - No data to display  ED Course/ Medical Decision Making/ A&P Clinical Course as of 06/05/23 1432  Wed Jun 05, 2023  1345 SARS Coronavirus 2 by RT PCR(!): POSITIVE [JL]    Clinical Course User Index [JL] Ernie Avena, MD                             Medical Decision Making Amount and/or Complexity of Data Reviewed Labs:   Decision-making details documented in ED Course.  Risk Prescription drug management.     72 year old female presenting to the emergency ferment with fever and fatigue for the past 3 days.  The patient denies any cough, chest pain, shortness of breath.  She has had decreased oral intake over the last few days.  Denies any abdominal pain, nausea or vomiting.  On arrival, the patient was vitally stable.  COVID-19 PCR testing was collected and resulted positive for COVID-19.  Patient lungs are CTAB, she has no cough and is not short of breath.  She denies any chest pain.  She presents with 2 days of low-grade fevers and fatigue.  Suspect symptoms are likely due to COVID-19.  Offered Paxlovid the patient accepted.  Recommended symptomatic management with Tylenol, ibuprofen, continued oral hydration, return precautions provided.  Given the lack of other symptoms, reassuring exam, do not think further workup is indicated at this time.  Stable for discharge.   Final Clinical Impression(s) / ED Diagnoses Final diagnoses:  COVID-19    Rx / DC Orders ED Discharge Orders          Ordered    nirmatrelvir & ritonavir (PAXLOVID, 300/100,) 20 x 150 MG & 10 x 100MG  TBPK  2 times daily        06/05/23 1349              Ernie Avena, MD 06/05/23 1432

## 2023-06-05 NOTE — ED Notes (Addendum)
Ambulatory trial with pulse ox.  Pt tolerated ambulation well with O2 sats staying at 96-97%, and HR staying between 90-95.  Pt did not complain of being SOB, dizzy, or any other symptoms during ambulation.

## 2023-06-14 DIAGNOSIS — F419 Anxiety disorder, unspecified: Secondary | ICD-10-CM | POA: Diagnosis not present

## 2023-06-18 DIAGNOSIS — L4050 Arthropathic psoriasis, unspecified: Secondary | ICD-10-CM | POA: Diagnosis not present

## 2023-06-28 DIAGNOSIS — N951 Menopausal and female climacteric states: Secondary | ICD-10-CM | POA: Diagnosis not present

## 2023-07-01 DIAGNOSIS — F419 Anxiety disorder, unspecified: Secondary | ICD-10-CM | POA: Diagnosis not present

## 2023-07-05 DIAGNOSIS — M2550 Pain in unspecified joint: Secondary | ICD-10-CM | POA: Diagnosis not present

## 2023-07-05 DIAGNOSIS — N951 Menopausal and female climacteric states: Secondary | ICD-10-CM | POA: Diagnosis not present

## 2023-07-05 DIAGNOSIS — Z6828 Body mass index (BMI) 28.0-28.9, adult: Secondary | ICD-10-CM | POA: Diagnosis not present

## 2023-07-05 DIAGNOSIS — R6882 Decreased libido: Secondary | ICD-10-CM | POA: Diagnosis not present

## 2023-07-06 DIAGNOSIS — Z1231 Encounter for screening mammogram for malignant neoplasm of breast: Secondary | ICD-10-CM | POA: Diagnosis not present

## 2023-07-15 DIAGNOSIS — F419 Anxiety disorder, unspecified: Secondary | ICD-10-CM | POA: Diagnosis not present

## 2023-07-19 DIAGNOSIS — Z8551 Personal history of malignant neoplasm of bladder: Secondary | ICD-10-CM | POA: Diagnosis not present

## 2023-07-19 DIAGNOSIS — Z0000 Encounter for general adult medical examination without abnormal findings: Secondary | ICD-10-CM | POA: Diagnosis not present

## 2023-07-19 DIAGNOSIS — M8589 Other specified disorders of bone density and structure, multiple sites: Secondary | ICD-10-CM | POA: Diagnosis not present

## 2023-07-19 DIAGNOSIS — I700 Atherosclerosis of aorta: Secondary | ICD-10-CM | POA: Diagnosis not present

## 2023-07-19 DIAGNOSIS — E7800 Pure hypercholesterolemia, unspecified: Secondary | ICD-10-CM | POA: Diagnosis not present

## 2023-07-19 DIAGNOSIS — L4050 Arthropathic psoriasis, unspecified: Secondary | ICD-10-CM | POA: Diagnosis not present

## 2023-07-19 DIAGNOSIS — E559 Vitamin D deficiency, unspecified: Secondary | ICD-10-CM | POA: Diagnosis not present

## 2023-07-26 DIAGNOSIS — E782 Mixed hyperlipidemia: Secondary | ICD-10-CM | POA: Diagnosis not present

## 2023-07-26 DIAGNOSIS — E559 Vitamin D deficiency, unspecified: Secondary | ICD-10-CM | POA: Diagnosis not present

## 2023-07-26 DIAGNOSIS — Z6828 Body mass index (BMI) 28.0-28.9, adult: Secondary | ICD-10-CM | POA: Diagnosis not present

## 2023-08-05 DIAGNOSIS — F419 Anxiety disorder, unspecified: Secondary | ICD-10-CM | POA: Diagnosis not present

## 2023-08-29 DIAGNOSIS — L4050 Arthropathic psoriasis, unspecified: Secondary | ICD-10-CM | POA: Diagnosis not present

## 2023-09-06 DIAGNOSIS — E559 Vitamin D deficiency, unspecified: Secondary | ICD-10-CM | POA: Diagnosis not present

## 2023-09-06 DIAGNOSIS — Z6828 Body mass index (BMI) 28.0-28.9, adult: Secondary | ICD-10-CM | POA: Diagnosis not present

## 2023-09-13 DIAGNOSIS — L409 Psoriasis, unspecified: Secondary | ICD-10-CM | POA: Diagnosis not present

## 2023-09-13 DIAGNOSIS — M1990 Unspecified osteoarthritis, unspecified site: Secondary | ICD-10-CM | POA: Diagnosis not present

## 2023-09-13 DIAGNOSIS — Z79899 Other long term (current) drug therapy: Secondary | ICD-10-CM | POA: Diagnosis not present

## 2023-09-13 DIAGNOSIS — L609 Nail disorder, unspecified: Secondary | ICD-10-CM | POA: Diagnosis not present

## 2023-09-13 DIAGNOSIS — M2550 Pain in unspecified joint: Secondary | ICD-10-CM | POA: Diagnosis not present

## 2023-09-13 DIAGNOSIS — L4050 Arthropathic psoriasis, unspecified: Secondary | ICD-10-CM | POA: Diagnosis not present

## 2023-09-13 DIAGNOSIS — M7989 Other specified soft tissue disorders: Secondary | ICD-10-CM | POA: Diagnosis not present

## 2023-09-13 DIAGNOSIS — R7982 Elevated C-reactive protein (CRP): Secondary | ICD-10-CM | POA: Diagnosis not present

## 2023-09-13 DIAGNOSIS — M79643 Pain in unspecified hand: Secondary | ICD-10-CM | POA: Diagnosis not present

## 2023-09-23 DIAGNOSIS — F419 Anxiety disorder, unspecified: Secondary | ICD-10-CM | POA: Diagnosis not present

## 2023-09-27 DIAGNOSIS — L4050 Arthropathic psoriasis, unspecified: Secondary | ICD-10-CM | POA: Diagnosis not present

## 2023-09-27 DIAGNOSIS — N951 Menopausal and female climacteric states: Secondary | ICD-10-CM | POA: Diagnosis not present

## 2023-09-30 DIAGNOSIS — F419 Anxiety disorder, unspecified: Secondary | ICD-10-CM | POA: Diagnosis not present

## 2023-10-07 DIAGNOSIS — F419 Anxiety disorder, unspecified: Secondary | ICD-10-CM | POA: Diagnosis not present

## 2023-10-11 DIAGNOSIS — E559 Vitamin D deficiency, unspecified: Secondary | ICD-10-CM | POA: Diagnosis not present

## 2023-10-11 DIAGNOSIS — R6882 Decreased libido: Secondary | ICD-10-CM | POA: Diagnosis not present

## 2023-10-11 DIAGNOSIS — R232 Flushing: Secondary | ICD-10-CM | POA: Diagnosis not present

## 2023-10-11 DIAGNOSIS — M2550 Pain in unspecified joint: Secondary | ICD-10-CM | POA: Diagnosis not present

## 2023-10-11 DIAGNOSIS — Z6828 Body mass index (BMI) 28.0-28.9, adult: Secondary | ICD-10-CM | POA: Diagnosis not present

## 2023-10-11 DIAGNOSIS — N898 Other specified noninflammatory disorders of vagina: Secondary | ICD-10-CM | POA: Diagnosis not present

## 2023-10-11 DIAGNOSIS — N951 Menopausal and female climacteric states: Secondary | ICD-10-CM | POA: Diagnosis not present

## 2023-10-21 DIAGNOSIS — F419 Anxiety disorder, unspecified: Secondary | ICD-10-CM | POA: Diagnosis not present

## 2023-11-01 DIAGNOSIS — L4050 Arthropathic psoriasis, unspecified: Secondary | ICD-10-CM | POA: Diagnosis not present

## 2023-11-04 DIAGNOSIS — F419 Anxiety disorder, unspecified: Secondary | ICD-10-CM | POA: Diagnosis not present

## 2023-11-08 DIAGNOSIS — Z6829 Body mass index (BMI) 29.0-29.9, adult: Secondary | ICD-10-CM | POA: Diagnosis not present

## 2023-11-08 DIAGNOSIS — E559 Vitamin D deficiency, unspecified: Secondary | ICD-10-CM | POA: Diagnosis not present

## 2023-11-19 DIAGNOSIS — R400 Somnolence: Secondary | ICD-10-CM | POA: Diagnosis not present

## 2023-11-19 DIAGNOSIS — F32A Depression, unspecified: Secondary | ICD-10-CM | POA: Diagnosis not present

## 2023-11-22 DIAGNOSIS — N951 Menopausal and female climacteric states: Secondary | ICD-10-CM | POA: Diagnosis not present

## 2023-11-22 DIAGNOSIS — E559 Vitamin D deficiency, unspecified: Secondary | ICD-10-CM | POA: Diagnosis not present

## 2023-11-22 DIAGNOSIS — E782 Mixed hyperlipidemia: Secondary | ICD-10-CM | POA: Diagnosis not present

## 2023-11-22 DIAGNOSIS — Z6829 Body mass index (BMI) 29.0-29.9, adult: Secondary | ICD-10-CM | POA: Diagnosis not present

## 2023-11-25 DIAGNOSIS — F419 Anxiety disorder, unspecified: Secondary | ICD-10-CM | POA: Diagnosis not present

## 2024-01-14 ENCOUNTER — Encounter (HOSPITAL_BASED_OUTPATIENT_CLINIC_OR_DEPARTMENT_OTHER): Payer: Self-pay | Admitting: Emergency Medicine

## 2024-01-14 ENCOUNTER — Emergency Department (HOSPITAL_BASED_OUTPATIENT_CLINIC_OR_DEPARTMENT_OTHER): Payer: Medicare Other | Admitting: Radiology

## 2024-01-14 ENCOUNTER — Emergency Department (HOSPITAL_BASED_OUTPATIENT_CLINIC_OR_DEPARTMENT_OTHER)
Admission: EM | Admit: 2024-01-14 | Discharge: 2024-01-14 | Disposition: A | Discharge status: Home / Self Care | Payer: Medicare Other | Attending: Emergency Medicine | Admitting: Emergency Medicine

## 2024-01-14 DIAGNOSIS — J101 Influenza due to other identified influenza virus with other respiratory manifestations: Secondary | ICD-10-CM | POA: Diagnosis not present

## 2024-01-14 DIAGNOSIS — R059 Cough, unspecified: Secondary | ICD-10-CM | POA: Diagnosis present

## 2024-01-14 DIAGNOSIS — Z91040 Latex allergy status: Secondary | ICD-10-CM | POA: Diagnosis not present

## 2024-01-14 LAB — RESP PANEL BY RT-PCR (RSV, FLU A&B, COVID)  RVPGX2
Influenza A by PCR: POSITIVE — AB
Influenza B by PCR: NEGATIVE
Resp Syncytial Virus by PCR: NEGATIVE
SARS Coronavirus 2 by RT PCR: NEGATIVE

## 2024-01-14 MED ORDER — OSELTAMIVIR PHOSPHATE 75 MG PO CAPS: 75.0000 mg | ORAL_CAPSULE | Freq: Two times a day (BID) | ORAL | 0 refills | Status: AC

## 2024-01-14 NOTE — ED Triage Notes (Signed)
 States she had fever and chills since yesterday. Recent finished a Zpak. Denies CP or SHOB.

## 2024-01-14 NOTE — Discharge Instructions (Signed)
 Begin taking Tamiflu as prescribed.  Take over-the-counter medications as needed for symptom relief.  Drink plenty of fluids and get plenty of rest.  Follow-up with primary doctor if not improving in the next few days, and return to the ER if you develop severe chest pain, difficulty breathing, or for other new and concerning symptoms.

## 2024-01-14 NOTE — ED Provider Notes (Signed)
 [facility] Provider Note   CSN: [id number] Arrival date & time: 01/14/24  1835     History  Chief Complaint  Patient presents with   Fever    SHALEIGH LAUBSCHER is a 73 y.o. female.  Patient is a 73 year old female presenting with complaints of bodyaches, cough, chills, and feeling generally unwell.  Symptoms started earlier this morning.  She does describe URI-like symptoms for the past several weeks, then was treated with an antibiotic.  She recently finished this and seemed to be feeling better until this morning.  She denies any chest pain or difficulty breathing.  No aggravating or alleviating factors.  She is due to travel for a conference tomorrow.  The history is provided by the patient.       Home Medications Prior to Admission medications   Medication Sig Start Date End Date Taking? Authorizing Provider  acetaminophen (TYLENOL) 325 MG tablet Take 2-3 tablets (650-975 mg total) by mouth every 6 (six) hours as needed for mild pain (or temp > 100). 11/10/21   Adam Phenix, PA-C  Biotin 10 MG CHEW Chew 10 mg by mouth once.    [provider]  Golimumab (SIMPONI [place]) Inject into the skin every 2 (two) months. Administered by Dr Casimer Lanius, [facility] - for psoriatic arthritis Patient not taking: Reported on 07/06/2022    [provider]  ibuprofen (ADVIL,MOTRIN) 200 MG tablet Take 400 mg by mouth 2 (two) times daily. For arthritic pain    [provider]  leflunomide (ARAVA) 20 MG tablet Take 20 mg by mouth daily. 04/21/22   [provider]  UNABLE TO FIND Premarin patch 0.625 mg  changes patch 2 x week (Tuesday and Friday)    [provider]      Allergies    Codeine, Latex, Lipitor [atorvastatin], Lisinopril, Onion, Wellbutrin [bupropion], Zocor [simvastatin], and Zoloft [sertraline]    Review of Systems   Review of Systems  All other systems reviewed and are  negative.   Physical Exam Updated Vital Signs BP 133/78   Pulse 87   Temp 98 F (36.7 C)   Resp 16   SpO2 97%  Physical Exam Vitals and nursing note reviewed.  Constitutional:      General: She is not in acute distress.    Appearance: She is well-developed. She is not diaphoretic.  HENT:     Head: Normocephalic and atraumatic.     Mouth/Throat:     Mouth: Mucous membranes are moist.     Pharynx: No oropharyngeal exudate or posterior oropharyngeal erythema.  Cardiovascular:     Rate and Rhythm: Normal rate and regular rhythm.     Heart sounds: No murmur heard.    No friction rub. No gallop.  Pulmonary:     Effort: Pulmonary effort is normal. No respiratory distress.     Breath sounds: Normal breath sounds. No wheezing.  Abdominal:     General: Bowel sounds are normal. There is no distension.     Palpations: Abdomen is soft.     Tenderness: There is no abdominal tenderness.  Musculoskeletal:        General: Normal range of motion.     Cervical back: Normal range of motion and neck supple.  Skin:    General: Skin is warm and dry.  Neurological:     General: No focal deficit present.     Mental Status: She is alert and oriented to person, place, and time.  ED Results / Procedures / Treatments   Labs (all labs ordered are listed, but only abnormal results are displayed) Labs Reviewed  RESP PANEL BY RT-PCR (RSV, FLU A&B, COVID)  RVPGX2 - Abnormal; Notable for the following components:      Result Value   Influenza A by PCR POSITIVE (*)    All other components within normal limits    EKG None  Radiology DG Chest 2 View Result Date: 01/14/2024 CLINICAL DATA:  Fever and chills. EXAM: CHEST - 2 VIEW COMPARISON:  Chest radiograph dated 01/04/2015. FINDINGS: The heart size and mediastinal contours are within normal limits. Aortic atherosclerosis. No focal consolidation, pleural effusion, or pneumothorax. No acute osseous abnormality. IMPRESSION: No acute  cardiopulmonary findings. Electronically Signed   By: Hart Robinsons M.D.   On: 01/14/2024 19:55    Procedures Procedures    Medications Ordered in ED Medications - No data to display  ED Course/ Medical Decision Making/ A&P  Patient presenting with URI symptoms as described in the HPI.  Patient arrives here with stable vital signs and is afebrile.  Chest x-ray obtained and is clear.  Respiratory panel is positive for influenza A.  Patient is clinically well-appearing with no hypoxia and I feel can safely be discharged.  I will prescribe Tamiflu and have patient follow-up with primary doctor as needed.  Final Clinical Impression(s) / ED Diagnoses Final diagnoses:  None    Rx / DC Orders ED Discharge Orders     None         Geoffery Lyons, MD 01/14/24 2334
